# Patient Record
Sex: Female | Born: 1990
Health system: Southern US, Community
[De-identification: ages and names within clinical notes are randomized; demographics above are authoritative.]

## PROBLEM LIST (undated history)

## (undated) DIAGNOSIS — O4443 Low lying placenta NOS or without hemorrhage, third trimester: Secondary | ICD-10-CM

## (undated) DIAGNOSIS — D649 Anemia, unspecified: Secondary | ICD-10-CM

## (undated) DIAGNOSIS — N912 Amenorrhea, unspecified: Secondary | ICD-10-CM

## (undated) DIAGNOSIS — R109 Unspecified abdominal pain: Secondary | ICD-10-CM

## (undated) DIAGNOSIS — K649 Unspecified hemorrhoids: Secondary | ICD-10-CM

## (undated) DIAGNOSIS — Z8041 Family history of malignant neoplasm of ovary: Secondary | ICD-10-CM

## (undated) DIAGNOSIS — A749 Chlamydial infection, unspecified: Secondary | ICD-10-CM

## (undated) DIAGNOSIS — O021 Missed abortion: Secondary | ICD-10-CM

## (undated) HISTORY — DX: Unspecified abdominal pain: R10.9

## (undated) HISTORY — DX: Amenorrhea, unspecified: N91.2

## (undated) HISTORY — DX: Chlamydial infection, unspecified: A74.9

## (undated) HISTORY — DX: Unspecified hemorrhoids: K64.9

## (undated) HISTORY — DX: Family history of malignant neoplasm of ovary: Z80.41

## (undated) HISTORY — DX: Missed abortion: O02.1

## (undated) HISTORY — PX: EYE SURGERY: SHX253

---
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
## 2010-05-21 DIAGNOSIS — A749 Chlamydial infection, unspecified: Secondary | ICD-10-CM

## 2010-05-21 HISTORY — DX: Chlamydial infection, unspecified: A74.9

## 2010-08-12 ENCOUNTER — Emergency Department: Payer: Self-pay | Admitting: Emergency Medicine

## 2011-02-22 ENCOUNTER — Emergency Department: Payer: Self-pay | Admitting: *Deleted

## 2012-04-30 ENCOUNTER — Emergency Department: Payer: Self-pay | Admitting: Emergency Medicine

## 2012-04-30 LAB — CBC
HGB: 13.3 g/dL (ref 12.0–16.0)
MCH: 25 pg — ABNORMAL LOW (ref 26.0–34.0)
MCV: 78 fL — ABNORMAL LOW (ref 80–100)
Platelet: 331 10*3/uL (ref 150–440)
RBC: 5.3 10*6/uL — ABNORMAL HIGH (ref 3.80–5.20)
WBC: 14.2 10*3/uL — ABNORMAL HIGH (ref 3.6–11.0)

## 2012-04-30 LAB — URINALYSIS, COMPLETE
Bilirubin,UR: NEGATIVE
Leukocyte Esterase: NEGATIVE
Ph: 5 (ref 4.5–8.0)
RBC,UR: 3 /HPF (ref 0–5)
Squamous Epithelial: 4
WBC UR: 2 /HPF (ref 0–5)

## 2012-04-30 LAB — COMPREHENSIVE METABOLIC PANEL
Albumin: 3.9 g/dL (ref 3.4–5.0)
Alkaline Phosphatase: 87 U/L (ref 50–136)
BUN: 7 mg/dL (ref 7–18)
Calcium, Total: 8.9 mg/dL (ref 8.5–10.1)
Co2: 26 mmol/L (ref 21–32)
Creatinine: 0.63 mg/dL (ref 0.60–1.30)
EGFR (African American): >60
EGFR (Non-African Amer.): >60
Glucose: 88 mg/dL (ref 65–99)
Osmolality: 269 (ref 275–301)
SGOT(AST): 26 U/L (ref 15–37)
SGPT (ALT): 17 U/L (ref 12–78)
Sodium: 136 mmol/L (ref 136–145)

## 2012-04-30 LAB — LIPASE, BLOOD: Lipase: 112 U/L (ref 73–393)

## 2014-05-31 ENCOUNTER — Ambulatory Visit: Payer: Self-pay | Admitting: Obstetrics & Gynecology

## 2014-05-31 LAB — CBC
HCT: 37.5 % (ref 35.0–47.0)
HGB: 12 g/dL (ref 12.0–16.0)
MCH: 24.9 pg — AB (ref 26.0–34.0)
MCHC: 31.9 g/dL — ABNORMAL LOW (ref 32.0–36.0)
MCV: 78 fL — AB (ref 80–100)
Platelet: 297 10*3/uL (ref 150–440)
RBC: 4.81 10*6/uL (ref 3.80–5.20)
RDW: 15.9 % — ABNORMAL HIGH (ref 11.5–14.5)
WBC: 10.4 10*3/uL (ref 3.6–11.0)

## 2014-06-01 ENCOUNTER — Ambulatory Visit: Payer: Self-pay | Admitting: Obstetrics & Gynecology

## 2014-06-01 DIAGNOSIS — O021 Missed abortion: Secondary | ICD-10-CM

## 2014-06-01 HISTORY — PX: DILATION AND CURETTAGE OF UTERUS: SHX78

## 2014-06-01 HISTORY — DX: Missed abortion: O02.1

## 2014-06-03 ENCOUNTER — Emergency Department: Payer: Self-pay | Admitting: Student

## 2014-09-13 LAB — SURGICAL PATHOLOGY

## 2014-09-19 NOTE — Op Note (Signed)
PATIENT NAME:  Deborah House, Amri R MR#:  [id number] DATE OF BIRTH:  [birth year]  DATE OF PROCEDURE:  06/01/2014  PREOPERATIVE DIAGNOSIS:  Missed abortion.   POSTOPERATIVE DIAGNOSIS: Missed abortion.  PROCEDURE:  Suction dilation and curettage.   SURGEON:  Annamarie MajorPaul Hye Trawick, MD   ANESTHESIA: General.   ESTIMATED BLOOD LOSS: Minimal.   COMPLICATIONS: None.   FINDINGS: Positive conception and specimen.   DISPOSITION: To the recovery room in stable condition.   TECHNIQUE: The patient is prepped and draped in the usual sterile fashion after adequate anesthesia is obtained in the dorsal lithotomy position.  The bladder is drained with a Robinson catheter. A speculum is placed and the anterior lip of the cervix is grasped with a tenaculum. The cervix is dilated to a size 20 Pratt dilator. An 8 mm rigid curette is then placed with aspiration of products of conception.  Using a grasping forceps tissue is retrieved from the uterus for specimen purposes.  Continued aspirations are performed until minimal tissue and bleeding is noted. A mild curettage with a banjo curette is then performed, and a final aspiration is performed.  Excellent hemostasis is noted at the tenaculum and cervical site. The patient goes to the recovery room in stable condition. All sponge, instrument, and needle counts are correct.      ____________________________ R. Annamarie MajorPaul Wendel Homeyer, MD rph:DT D: 06/01/2014 09:32:32 ET T: 06/01/2014 10:13:04 ET JOB#: [id number]  cc: Dierdre Searles. Paul Jamilex Bohnsack, MD, <Dictator> Nadara MustardOBERT P Juandavid Dallman MD ELECTRONICALLY SIGNED 06/01/2014 14:07

## 2015-02-23 ENCOUNTER — Emergency Department
Admission: EM | Admit: 2015-02-23 | Discharge: 2015-02-24 | Disposition: A | Discharge status: Home / Self Care | Payer: 59 | Attending: Emergency Medicine | Admitting: Emergency Medicine

## 2015-02-23 ENCOUNTER — Encounter: Payer: Self-pay | Admitting: Emergency Medicine

## 2015-02-23 DIAGNOSIS — T7840XA Allergy, unspecified, initial encounter: Secondary | ICD-10-CM

## 2015-02-23 DIAGNOSIS — X58XXXA Exposure to other specified factors, initial encounter: Secondary | ICD-10-CM | POA: Insufficient documentation

## 2015-02-23 DIAGNOSIS — Y9389 Activity, other specified: Secondary | ICD-10-CM | POA: Insufficient documentation

## 2015-02-23 DIAGNOSIS — Y998 Other external cause status: Secondary | ICD-10-CM | POA: Insufficient documentation

## 2015-02-23 DIAGNOSIS — T783XXA Angioneurotic edema, initial encounter: Secondary | ICD-10-CM | POA: Diagnosis not present

## 2015-02-23 DIAGNOSIS — Y9289 Other specified places as the place of occurrence of the external cause: Secondary | ICD-10-CM | POA: Diagnosis not present

## 2015-02-23 DIAGNOSIS — Z79899 Other long term (current) drug therapy: Secondary | ICD-10-CM | POA: Diagnosis not present

## 2015-02-23 MED ORDER — METHYLPREDNISOLONE SODIUM SUCC 125 MG IJ SOLR
125.0000 mg | Freq: Once | INTRAMUSCULAR | Status: AC
  Administered 2015-02-23: 125 mg via INTRAVENOUS
  Filled 2015-02-23: qty 2

## 2015-02-23 MED ORDER — DIPHENHYDRAMINE HCL 50 MG/ML IJ SOLN
25.0000 mg | Freq: Once | INTRAMUSCULAR | Status: AC
  Administered 2015-02-23: 25 mg via INTRAVENOUS
  Filled 2015-02-23: qty 1

## 2015-02-23 MED ORDER — SODIUM CHLORIDE 0.9 % IV BOLUS (SEPSIS)
1000.0000 mL | Freq: Once | INTRAVENOUS | Status: AC
  Administered 2015-02-23: 1000 mL via INTRAVENOUS

## 2015-02-23 MED ORDER — FAMOTIDINE IN NACL 20-0.9 MG/50ML-% IV SOLN
20.0000 mg | Freq: Once | INTRAVENOUS | Status: AC
  Administered 2015-02-23: 20 mg via INTRAVENOUS
  Filled 2015-02-23: qty 50

## 2015-02-23 NOTE — ED Notes (Signed)
Patient presents to ED with c/o allergic reaction 30 min PTA, pt reports she has not changed laundry detergent, soaps, or foods. Patient states she had tooth extraction performed this morning, reports was not placed on antibiotics or new medications. Patient's eyes  And lips are swollen, report throat is itching, but denies difficulty breathing. Pt able to speak in complete sentences, no increased work in breathing noted. Skin warm and dry. No other complaints at this time. Will continue to monitor patient.

## 2015-02-23 NOTE — ED Notes (Signed)
Patient presents to ED with an allergic reaction. Unsure what she was exposed to. Had a tooth pulled this morning but denies new medications or antibiotics. Patient's eyes are swollen, says her mouth is itchy, lips are swollen. Able to speak in complete sentences without respiratory distress.

## 2015-02-23 NOTE — ED Provider Notes (Signed)
[facility] Emergency Department Provider Note  ____________________________________________  Time seen: Approximately 11:26 PM  I have reviewed the triage vital signs and the nursing notes.   HISTORY  Chief Complaint Allergic Reaction    HPI Deborah House is a 24 y.o. female who presents to the ED from home with a chief complaint of acute allergic reaction. Patient reports onset of bilateral eye swelling and itchy sensation approximately 30 minutes PTA while she was braiding her hair. Patient states she had a tooth extraction performed this morning, did not receive nor take antibiotics. She took equate brand ibuprofen at approximately 6 PM, which she has taken previously without adverse reaction. Patient ate tomato soup with milk in it, again items that she has eaten previously without adverse reaction. Patient denies chest pain, shortness of breath, abdominal pain, nausea, vomiting, diarrhea, hives. States initially her mouth was itchy but that has since resolved. Patient did not take medicines prior to arrival. Denies new exposures otherwise.States this has happened twice before, most recently last month. This is her first trip to the emergency department for evaluation of allergic reaction.   Past medical history None   There are no active problems to display for this patient.   Past surgical history Tooth extraction  Current Outpatient Rx  Name  Route  Sig  Dispense  Refill  . norgestimate-ethinyl estradiol (ORTHO-CYCLEN,SPRINTEC,PREVIFEM) 0.25-35 MG-MCG tablet   Oral   Take 1 tablet by mouth daily.           Allergies NKDA  No family history on file.  Social History Social History  Substance Use Topics  . Smoking status: Never Smoker   . Smokeless tobacco: None  . Alcohol Use: No    Review of Systems Constitutional: No fever/chills Eyes: Positive for bilateral eye swelling. No visual changes. ENT: No sore throat. Cardiovascular:  Denies chest pain. Respiratory: Denies shortness of breath. Gastrointestinal: No abdominal pain.  No nausea, no vomiting.  No diarrhea.  No constipation. Genitourinary: Negative for dysuria. Musculoskeletal: Negative for back pain. Skin: Negative for rash. Neurological: Negative for headaches, focal weakness or numbness.  10-point ROS otherwise negative.  ____________________________________________   PHYSICAL EXAM:  VITAL SIGNS: ED Triage Vitals  Enc Vitals Group     BP 02/23/15 2323 132/97 mmHg     Pulse Rate 02/23/15 2323 91     Resp --      Temp 02/23/15 2323 98.3 F (36.8 C)     Temp Source 02/23/15 2323 Oral     SpO2 02/23/15 2323 100 %     Weight 02/23/15 2323 190 lb (86.183 kg)     Height 02/23/15 2323  (1.676 m)     Head Cir --      Peak Flow --      Pain Score 02/23/15 2325 0     Pain Loc --      Pain Edu? --      Excl. in GC? --     Constitutional: Alert and oriented. Well appearing and in no acute distress. Eyes: Bilateral periorbital edema. Conjunctivae are normal. PERRL. EOMI. Head: Atraumatic. Nose: No congestion/rhinnorhea. Mouth/Throat: Mucous membranes are moist.  Oropharynx non-erythematous. There is no tongue swelling. There is no hoarse or muffled voice. There is no drooling. Neck: No stridor. Submental space is supple and soft. Cardiovascular: Normal rate, regular rhythm. Grossly normal heart sounds.  Good peripheral circulation. Respiratory: Normal respiratory effort.  No retractions. Lungs CTAB. Gastrointestinal: Soft and nontender. No distention. No  abdominal bruits. No CVA tenderness. Musculoskeletal: No lower extremity tenderness nor edema.  No joint effusions. Neurologic:  Normal speech and language. No gross focal neurologic deficits are appreciated. No gait instability. Skin:  Skin is warm, dry and intact. No rash noted. Specifically, no hives. Psychiatric: Mood and affect are normal. Speech and behavior are  normal.  ____________________________________________   LABS (all labs ordered are listed, but only abnormal results are displayed)  Labs Reviewed - No data to display ____________________________________________  EKG  None ____________________________________________  RADIOLOGY  None ____________________________________________   PROCEDURES  Procedure(s) performed: None  Critical Care performed: No  ____________________________________________   INITIAL IMPRESSION / ASSESSMENT AND PLAN / ED COURSE  Pertinent labs & imaging results that were available during my care of the patient were reviewed by me and considered in my medical decision making (see chart for details).  24 year old female who presents for acute allergic reaction. Will initiate IV Solu-Medrol, Benadryl, Pepcid and continue observation 3 hours. Will hold off on IM epinephrine given patient is not experiencing breathng difficulty, tongue swelling or shortness of breath. Ice pack given for patient's eyes.  ----------------------------------------- 1:50 AM on 02/24/2015 -----------------------------------------  Swelling decreased. There is no tongue swelling or respiratory distress. Room air saturations 98%. Strict return precautions given. Patient verbalizes understanding and agrees with plan of care. ____________________________________________   FINAL CLINICAL IMPRESSION(S) / ED DIAGNOSES  Final diagnoses:  Allergic reaction, initial encounter  Angioedema, initial encounter      Irean Hong, MD 02/24/15 [phone]

## 2015-02-24 MED ORDER — PREDNISONE 20 MG PO TABS: ORAL_TABLET | ORAL | Status: DC

## 2015-02-24 MED ORDER — FAMOTIDINE 20 MG PO TABS: 20.0000 mg | ORAL_TABLET | Freq: Two times a day (BID) | ORAL | Status: DC

## 2015-02-24 NOTE — ED Notes (Signed)

## 2015-02-24 NOTE — Discharge Instructions (Signed)
1. Take the following medicines for the next 4 days: °Prednisone 60mg daily °Pepcid 20mg twice daily °2. Take Benadryl as needed for itching. °3. Return to the ER for worsening symptoms, persistent vomiting, difficulty breathing or other concerns. ° ° °Allergies °An allergy is an abnormal reaction to a substance by the body's defense system (immune system). Allergies can develop at any age. °WHAT CAUSES ALLERGIES? °An allergic reaction happens when the immune system mistakenly reacts to a normally harmless substance, called an allergen, as if it were harmful. The immune system releases antibodies to fight the substance. Antibodies eventually release a chemical called histamine into the bloodstream. The release of histamine is meant to protect the body from infection, but it also causes discomfort. °An allergic reaction can be triggered by: °· Eating an allergen. °· Inhaling an allergen. °· Touching an allergen. °WHAT TYPES OF ALLERGIES ARE THERE? °There are many types of allergies. Common types include: °· Seasonal allergies. People with this type of allergy are usually allergic to substances that are only present during certain seasons, such as molds and pollens. °· Food allergies. °· Drug allergies. °· Insect allergies. °· Animal dander allergies. °WHAT ARE SYMPTOMS OF ALLERGIES? °Possible allergy symptoms include: °· Swelling of the lips, face, tongue, mouth, or throat. °· Sneezing, coughing, or wheezing. °· Nasal congestion. °· Tingling in the mouth. °· Rash. °· Itching. °· Itchy, red, swollen areas of skin (hives). °· Watery eyes. °· Vomiting. °· Diarrhea. °· Dizziness. °· Lightheadedness. °· Fainting. °· Trouble breathing or swallowing. °· Chest tightness. °· Rapid heartbeat. °HOW ARE ALLERGIES DIAGNOSED? °Allergies are diagnosed with a medical and family history and one or more of the following: °· Skin tests. °· Blood tests. °· A food diary. A food diary is a record of all the foods and drinks you have in a  day and of all the symptoms you experience. °· The results of an elimination diet. An elimination diet involves eliminating foods from your diet and then adding them back in one by one to find out if a certain food causes an allergic reaction. °HOW ARE ALLERGIES TREATED? °There is no cure for allergies, but allergic reactions can be treated with medicine. Severe reactions usually need to be treated at a hospital. °HOW CAN REACTIONS BE PREVENTED? °The best way to prevent an allergic reaction is by avoiding the substance you are allergic to. Allergy shots and medicines can also help prevent reactions in some cases. People with severe allergic reactions may be able to prevent a life-threatening reaction called anaphylaxis with a medicine given right after exposure to the allergen. °  °This information is not intended to replace advice given to you by your health care provider. Make sure you discuss any questions you have with your health care provider. °  °Document Released: 07/31/2002 Document Revised: 05/28/2014 Document Reviewed: 02/16/2014 °Elsevier Interactive Patient Education ©2016 [employer] ° °Angioedema °Angioedema is a sudden swelling of tissues, often of the skin. It can occur on the face or genitals or in the abdomen or other body parts. The swelling usually develops over a short period and gets better in 24 to 48 hours. It often begins during the night and is found when the person wakes up. The person may also get red, itchy patches of skin (hives). Angioedema can be dangerous if it involves swelling of the air passages.  °Depending on the cause, episodes of angioedema may only happen once, come back in unpredictable patterns, or repeat for several years and then   gradually fade away.  °CAUSES  °Angioedema can be caused by an allergic reaction to various triggers. It can also result from nonallergic causes, including reactions to drugs, immune system disorders, viral infections, or an abnormal gene that  is passed to you from your parents (hereditary). For some people with angioedema, the cause is unknown.  °Some things that can trigger angioedema include:  °· Foods.   °· Medicines, such as ACE inhibitors, ARBs, nonsteroidal anti-inflammatory agents, or estrogen.   °· Latex.   °· Animal saliva.   °· Insect stings.   °· Dyes used in X-rays.   °· Mild injury.   °· Dental work. °· Surgery. °· Stress.   °· Sudden changes in temperature.   °· Exercise. °SIGNS AND SYMPTOMS  °· Swelling of the skin. °· Hives. If these are present, there is also intense itching. °· Redness in the affected area.   °· Pain in the affected area. °· Swollen lips or tongue. °· Breathing problems. This may happen if the air passages swell. °· Wheezing. °If internal organs are involved, there may be:  °· Nausea.   °· Abdominal pain.   °· Vomiting.   °· Difficulty swallowing.   °· Difficulty passing urine. °DIAGNOSIS  °· Your health care provider will examine the affected area and take a medical and family history. °· Various tests may be done to help determine the cause. Tests may include: °¨ Allergy skin tests to see if the problem is an allergic reaction.   °¨ Blood tests to check for hereditary angioedema.   °¨ Tests to check for underlying diseases that could cause the condition.   °· A review of your medicines, including over-the-counter medicines, may be done. °TREATMENT  °Treatment will depend on the cause of the angioedema. Possible treatments include:  °· Removal of anything that triggered the condition (such as stopping certain medicines).   °· Medicines to treat symptoms or prevent attacks. Medicines given may include:   °¨ Antihistamines.   °¨ Epinephrine injection.   °¨ Steroids.   °· Hospitalization may be required for severe attacks. If the air passages are affected, it can be an emergency. Tubes may need to be placed to keep the airway open. °HOME CARE INSTRUCTIONS  °· Take all medicines as directed by your health care  provider. °· If you were given medicines for emergency allergy treatment, always carry them with you. °· Wear a medical bracelet as directed by your health care provider.   °· Avoid known triggers. °SEEK MEDICAL CARE IF:  °· You have repeat attacks of angioedema.   °· Your attacks are more frequent or more severe despite preventive measures.   °· You have hereditary angioedema and are considering having children. It is important to discuss with your health care provider the risks of passing the condition on to your children. °SEEK IMMEDIATE MEDICAL CARE IF:  °· You have severe swelling of the mouth, tongue, or lips. °· You have difficulty breathing.   °· You have difficulty swallowing.   °· You faint. °MAKE SURE YOU: °· Understand these instructions. °· Will watch your condition. °· Will get help right away if you are not doing well or get worse. °  °This information is not intended to replace advice given to you by your health care provider. Make sure you discuss any questions you have with your health care provider. °  °Document Released: 07/16/2001 Document Revised: 05/28/2014 Document Reviewed: 12/29/2012 °Elsevier Interactive Patient Education ©2016 Elsevier Inc. ° °

## 2015-02-24 NOTE — ED Notes (Addendum)
Pt reports itching has resolved, swelling has decreased some around the eyes and lips. Patient denies difficulty swallowing or breathing.  NAD noted. Patient resting comfortably in bed.

## 2015-05-22 HISTORY — PX: MOUTH SURGERY: SHX715

## 2015-07-20 DIAGNOSIS — Z8041 Family history of malignant neoplasm of ovary: Secondary | ICD-10-CM

## 2015-07-20 HISTORY — DX: Family history of malignant neoplasm of ovary: Z80.41

## 2015-07-20 LAB — HM PAP SMEAR: HM PAP: NEGATIVE

## 2016-09-12 ENCOUNTER — Ambulatory Visit (INDEPENDENT_AMBULATORY_CARE_PROVIDER_SITE_OTHER): Payer: 59 | Admitting: Obstetrics and Gynecology

## 2016-09-12 ENCOUNTER — Encounter: Payer: Self-pay | Admitting: Obstetrics and Gynecology

## 2016-09-12 VITALS — BP 120/90 | HR 60 | Ht 66.0 in | Wt 204.0 lb

## 2016-09-12 DIAGNOSIS — B373 Candidiasis of vulva and vagina: Secondary | ICD-10-CM | POA: Diagnosis not present

## 2016-09-12 DIAGNOSIS — N898 Other specified noninflammatory disorders of vagina: Secondary | ICD-10-CM | POA: Diagnosis not present

## 2016-09-12 DIAGNOSIS — B3731 Acute candidiasis of vulva and vagina: Secondary | ICD-10-CM

## 2016-09-12 LAB — POCT WET PREP WITH KOH
Clue Cells Wet Prep HPF POC: NEGATIVE
KOH PREP POC: NEGATIVE
Trichomonas, UA: NEGATIVE
Yeast Wet Prep HPF POC: POSITIVE

## 2016-09-12 MED ORDER — TERCONAZOLE 0.4 % VA CREA: 1.0000 | TOPICAL_CREAM | Freq: Once | VAGINAL | 2 refills | Status: AC

## 2016-09-12 NOTE — Progress Notes (Signed)
Chief Complaint  Patient presents with  . Gynecologic Exam    YEAST INF, ITCHING    HPI:      Ms. Deborah House is a 26 y.o. G1P0010 who LMP was Patient's last menstrual period was 08/14/2016., presents today for vaginal itching, irritaiton, d/c, without odor for the past 5 days. She has a hx of recurrent yeast and AV on One Swab 2/18. Pt's sx improve with yeast meds. Pt is using dove sens skin soap, no dryer sheets. She is using pantyliners now. No hx of DM/pre-DM.    Review of Systems  Constitutional: Negative for fever.  Gastrointestinal: Negative for blood in stool, constipation, diarrhea, nausea and vomiting.  Genitourinary: Positive for vaginal discharge. Negative for dyspareunia, dysuria, flank pain, frequency, hematuria, urgency, vaginal bleeding and vaginal pain.  Musculoskeletal: Negative for back pain.  Skin: Positive for rash.    There are no active problems to display for this patient.   Family History  Problem Relation Age of Onset  . Diabetes Maternal Grandmother   . Cancer Maternal Grandmother 10    OVARIAN    Social History   Social History  . Marital status: Married    Spouse name: N/A  . Number of children: 0  . Years of education: 12   Occupational History  . [employer]    Social History Main Topics  . Smoking status: Never Smoker  . Smokeless tobacco: Never Used  . Alcohol use No  . Drug use: No  . Sexual activity: Yes    Birth control/ protection: None   Other Topics Concern  . Not on file   Social History Narrative  . No narrative on file     Current Outpatient Prescriptions:  .  terconazole (TERAZOL 7) 0.4 % vaginal cream, Place 1 applicator vaginally once. Nightly for 7 nights, then once weekly for 3 months as maintenance, Disp: 45 g, Rfl: 2  OBJECTIVE:   Vitals:  BP 120/90 (Patient Position: Sitting)   Pulse 60   Ht  (1.676 m)   Wt 204 lb (92.5 kg)   LMP 08/14/2016 Comment: 5D EARLY  BMI 32.93 kg/m    Physical Exam  Constitutional: She is oriented to person, place, and time and well-developed, well-nourished, and in no distress. Vital signs are normal.  Genitourinary: Uterus normal, cervix normal, right adnexa normal and left adnexa normal. Uterus is not enlarged. Cervix exhibits no motion tenderness and no tenderness. Right adnexum displays no mass and no tenderness. Left adnexum displays no mass and no tenderness. Vulva exhibits erythema and exudate. Vulva exhibits no lesion, no rash and no tenderness. Vagina exhibits no lesion. Curdy  brown and vaginal discharge found.  Neurological: She is oriented to person, place, and time.  Vitals reviewed.   Results: Results for orders placed or performed in visit on 09/12/16 (from the past 24 hour(s))  POCT Wet Prep with KOH     Status: Abnormal   Collection Time: 09/12/16  9:27 AM  Result Value Ref Range   Trichomonas, UA Negative    Clue Cells Wet Prep HPF POC neg    Epithelial Wet Prep HPF POC  Few, Moderate, Many, Too numerous to count   Yeast Wet Prep HPF POC pos    Bacteria Wet Prep HPF POC  Few   RBC Wet Prep HPF POC     WBC Wet Prep HPF POC     KOH Prep POC Negative Negative     Assessment/Plan: Candidal vaginitis -  Rx terazol for 7 nights, then once weekly for 3 months as maintenance. F/u prn.  - Plan: POCT Wet Prep with KOH  Vaginal discharge - Hx of AV (GBS), so if sx persist with yeast tx, will retreat for GBS. - Plan: POCT Wet Prep with KOH     Meds ordered this encounter  Medications  . DISCONTD: terconazole (TERAZOL 7) 0.4 % vaginal cream    Sig: Place 1 applicator vaginally once.    Refill:  0  . terconazole (TERAZOL 7) 0.4 % vaginal cream    Sig: Place 1 applicator vaginally once. Nightly for 7 nights, then once weekly for 3 months as maintenance    Dispense:  45 g    Refill:  2      Return if symptoms worsen or fail to improve, for annual.  Alicia B. Copland, PA-C 09/12/2016 9:26 AM

## 2016-11-02 ENCOUNTER — Telehealth: Payer: Self-pay

## 2016-11-02 NOTE — Telephone Encounter (Signed)
Pt called to see if okay to use terazol 7 when preg (early).  Adv per [facility] it is okay to continue using.

## 2016-11-07 ENCOUNTER — Encounter: Payer: Self-pay | Admitting: Obstetrics & Gynecology

## 2016-11-07 ENCOUNTER — Ambulatory Visit (INDEPENDENT_AMBULATORY_CARE_PROVIDER_SITE_OTHER): Payer: 59 | Admitting: Obstetrics & Gynecology

## 2016-11-07 VITALS — BP 100/60 | Wt 208.0 lb

## 2016-11-07 DIAGNOSIS — O99211 Obesity complicating pregnancy, first trimester: Secondary | ICD-10-CM

## 2016-11-07 DIAGNOSIS — O99210 Obesity complicating pregnancy, unspecified trimester: Secondary | ICD-10-CM | POA: Insufficient documentation

## 2016-11-07 DIAGNOSIS — Z3490 Encounter for supervision of normal pregnancy, unspecified, unspecified trimester: Secondary | ICD-10-CM | POA: Diagnosis not present

## 2016-11-07 DIAGNOSIS — O0993 Supervision of high risk pregnancy, unspecified, third trimester: Secondary | ICD-10-CM | POA: Insufficient documentation

## 2016-11-07 DIAGNOSIS — Z3A01 Less than 8 weeks gestation of pregnancy: Secondary | ICD-10-CM

## 2016-11-07 LAB — POCT URINE PREGNANCY: PREG TEST UR: POSITIVE — AB

## 2016-11-07 NOTE — Progress Notes (Signed)
11/07/2016   Chief Complaint: Missed period  Transfer of Care Patient: no  History of Present Illness: Deborah House is a 26 y.o. G2P0010 [redacted]w[redacted]d based on Patient's last menstrual period was 09/20/2016. with an Estimated Date of Delivery: 06/27/17, with the above CC.   Her periods were: irregular periods from 28 to 36 days She was using no method when she conceived.  She has Positive signs or symptoms of nausea/vomiting of pregnancy. She has Negative signs or symptoms of miscarriage or preterm labor She identifies Negative Zika risk factors for her and her partner On any different medications around the time she conceived/early pregnancy: No  History of varicella: Yes   ROS: A 12-point review of systems was performed and negative, except as stated in the above HPI.  OBGYN History: As per HPI. OB History  Gravida Para Term Preterm AB Living  2       1    SAB TAB Ectopic Multiple Live Births  1            # Outcome Date GA Lbr Len/2nd Weight Sex Delivery Anes PTL Lv  2 Current           1 SAB 06/01/14             Birth Comments: D&C      Any issues with any prior pregnancies: no Any prior children are healthy, doing well, without any problems or issues: not applicable History of pap smears: No. Last pap smear 2016. Abnormal: no  History of STIs: No   Past Medical History: Past Medical History:  Diagnosis Date  . Abdominal pain   . Amenorrhea   . Chlamydia 2012   tx'd  . Family history of ovarian cancer 07/2015   genetic testing letter sent  . Hemorrhoids   . Missed abortion 06/01/2014   [facility]    Past Surgical History: Past Surgical History:  Procedure Laterality Date  . DILATION AND CURETTAGE OF UTERUS      Family History:  Family History  Problem Relation Age of Onset  . Diabetes Maternal Grandmother   . Cancer Maternal Grandmother 57       OVARIAN   She denies any female cancers, bleeding or blood clotting disorders.  She denies any history of mental  retardation, birth defects or genetic disorders in her or the FOB's history  Social History:  Social History   Social History  . Marital status: Married    Spouse name: N/A  . Number of children: 0  . Years of education: 12   Occupational History  . [employer]    Social History Main Topics  . Smoking status: Never Smoker  . Smokeless tobacco: Never Used  . Alcohol use No  . Drug use: No  . Sexual activity: Yes    Birth control/ protection: None   Other Topics Concern  . Not on file   Social History Narrative  . No narrative on file   Any pets in the household: no  Allergy: Allergies  Allergen Reactions  . Ibuprofen     Swelling     Current Outpatient Medications:  Current Outpatient Prescriptions:  .  terconazole (TERAZOL 7) 0.4 % vaginal cream, PLACE 1 APPLICATOR VAGINALLY NIGHTLY FOR 7 NIGHTS, THEN ONCE WEEKLY FOR 3 MONTHS AS MAINTENANCE, Disp: , Rfl: 2   Physical Exam:   BP 100/60   Wt 208 lb (94.3 kg)   LMP 09/20/2016 Comment: 5D EARLY  BMI 33.57 kg/m  Body mass index  is 33.57 kg/m. Constitutional: Well nourished, well developed female in no acute distress.  Neck:  Supple, normal appearance, and no thyromegaly  Cardiovascular: S1, S2 normal, no murmur, rub or gallop, regular rate and rhythm Respiratory:  Clear to auscultation bilateral. Normal respiratory effort Abdomen: positive bowel sounds and no masses, hernias; diffusely non tender to palpation, non distended Breasts: breasts appear normal, no suspicious masses, no skin or nipple changes or axillary nodes. Neuro/Psych:  Normal mood and affect.  Skin:  Warm and dry.  Lymphatic:  No inguinal lymphadenopathy.   Pelvic exam: is not limited by body habitus EGBUS: within normal limits, Vagina: within normal limits and with no blood in the vault, Cervix: normal appearing cervix without discharge or lesions, closed/long/high, Uterus:  enlarged: 6 weeks, and Adnexa:  no mass, fullness,  tenderness  Assessment: Deborah House is a 26 y.o. G2P0010 1169w6d based on Patient's last menstrual period was 09/20/2016. with an Estimated Date of Delivery: 06/27/17,  for prenatal care.  Plan:  1) Avoid alcoholic beverages. 2) Patient encouraged not to smoke.  3) Discontinue the use of all non-medicinal drugs and chemicals.  4) Take prenatal vitamins daily.  5) Seatbelt use advised 6) Nutrition, food safety (fish, cheese advisories, and high nitrite foods) and exercise discussed. 7) Hospital and [facility] discussed  8) Patient is asked about travel to areas at risk for the Zika virus, and counseled to avoid travel and exposure to mosquitoes or sexual partners who may have themselves been exposed to the virus. Testing is discussed, and will be ordered as appropriate.  9) [facility]birth classes at [facility] advised 10) Genetic Screening, such as with 1st Trimester Screening, cell free fetal DNA, AFP testing, and Ultrasound, as well as with amniocentesis and CVS as appropriate, is discussed with patient. She plans to have not genetic testing this pregnancy. 11) Early Glucola due to obesity  Problem list reviewed and updated.  Annamarie MajorPaul Kayvon Mo, MD, Merlinda FrederickFACOG [facility], [facility] 11/07/2016  2:10 PM

## 2016-11-07 NOTE — Addendum Note (Signed)
Addended by: Cornelius MorasPATTERSON, Timohty Renbarger D on: 11/07/2016 02:33 PM   Modules accepted: Orders

## 2016-11-07 NOTE — Patient Instructions (Signed)
First Trimester of Pregnancy The first trimester of pregnancy is from week 1 until the end of week 13 (months 1 through 3). A week after a sperm fertilizes an egg, the egg will implant on the wall of the uterus. This embryo will begin to develop into a baby. Genes from you and your partner will form the baby. The female genes will determine whether the baby will be a boy or a girl. At 6-8 weeks, the eyes and face will be formed, and the heartbeat can be seen on ultrasound. At the end of 12 weeks, all the baby's organs will be formed. Now that you are pregnant, you will want to do everything you can to have a healthy baby. Two of the most important things are to get good prenatal care and to follow your health care provider's instructions. Prenatal care is all the medical care you receive before the baby's birth. This care will help prevent, find, and treat any problems during the pregnancy and childbirth. Body changes during your first trimester Your body goes through many changes during pregnancy. The changes vary from woman to woman.  You may gain or lose a couple of pounds at first.  You may feel sick to your stomach (nauseous) and you may throw up (vomit). If the vomiting is uncontrollable, call your health care provider.  You may tire easily.  You may develop headaches that can be relieved by medicines. All medicines should be approved by your health care provider.  You may urinate more often. Painful urination may mean you have a bladder infection.  You may develop heartburn as a result of your pregnancy.  You may develop constipation because certain hormones are causing the muscles that push stool through your intestines to slow down.  You may develop hemorrhoids or swollen veins (varicose veins).  Your breasts may begin to grow larger and become tender. Your nipples may stick out more, and the tissue that surrounds them (areola) may become darker.  Your gums may bleed and may be  sensitive to brushing and flossing.  Dark spots or blotches (chloasma, mask of pregnancy) may develop on your face. This will likely fade after the baby is born.  Your menstrual periods will stop.  You may have a loss of appetite.  You may develop cravings for certain kinds of food.  You may have changes in your emotions from day to day, such as being excited to be pregnant or being concerned that something may go wrong with the pregnancy and baby.  You may have more vivid and strange dreams.  You may have changes in your hair. These can include thickening of your hair, rapid growth, and changes in texture. Some women also have hair loss during or after pregnancy, or hair that feels dry or thin. Your hair will most likely return to normal after your baby is born.  What to expect at prenatal visits During a routine prenatal visit:  You will be weighed to make sure you and the baby are growing normally.  Your blood pressure will be taken.  Your abdomen will be measured to track your baby's growth.  The fetal heartbeat will be listened to between weeks 10 and 14 of your pregnancy.  Test results from any previous visits will be discussed.  Your health care provider may ask you:  How you are feeling.  If you are feeling the baby move.  If you have had any abnormal symptoms, such as leaking fluid, bleeding, severe headaches,   or abdominal cramping.  If you are using any tobacco products, including cigarettes, chewing tobacco, and electronic cigarettes.  If you have any questions.  Other tests that may be performed during your first trimester include:  Blood tests to find your blood type and to check for the presence of any previous infections. The tests will also be used to check for low iron levels (anemia) and protein on red blood cells (Rh antibodies). Depending on your risk factors, or if you previously had diabetes during pregnancy, you may have tests to check for high blood  sugar that affects pregnant women (gestational diabetes).  Urine tests to check for infections, diabetes, or protein in the urine.  An ultrasound to confirm the proper growth and development of the baby.  Fetal screens for spinal cord problems (spina bifida) and Down syndrome.  HIV (human immunodeficiency virus) testing. Routine prenatal testing includes screening for HIV, unless you choose not to have this test.  You may need other tests to make sure you and the baby are doing well.  Follow these instructions at home: Medicines  Follow your health care provider's instructions regarding medicine use. Specific medicines may be either safe or unsafe to take during pregnancy.  Take a prenatal vitamin that contains at least 600 micrograms (mcg) of folic acid.  If you develop constipation, try taking a stool softener if your health care provider approves. Eating and drinking  Eat a balanced diet that includes fresh fruits and vegetables, whole grains, good sources of protein such as meat, eggs, or tofu, and low-fat dairy. Your health care provider will help you determine the amount of weight gain that is right for you.  Avoid raw meat and uncooked cheese. These carry germs that can cause birth defects in the baby.  Eating four or five small meals rather than three large meals a day may help relieve nausea and vomiting. If you start to feel nauseous, eating a few soda crackers can be helpful. Drinking liquids between meals, instead of during meals, also seems to help ease nausea and vomiting.  Limit foods that are high in fat and processed sugars, such as fried and sweet foods.  To prevent constipation: ? Eat foods that are high in fiber, such as fresh fruits and vegetables, whole grains, and beans. ? Drink enough fluid to keep your urine clear or pale yellow. Activity  Exercise only as directed by your health care provider. Most women can continue their usual exercise routine during  pregnancy. Try to exercise for 30 minutes at least 5 days a week. Exercising will help you: ? Control your weight. ? Stay in shape. ? Be prepared for labor and delivery.  Experiencing pain or cramping in the lower abdomen or lower back is a good sign that you should stop exercising. Check with your health care provider before continuing with normal exercises.  Try to avoid standing for long periods of time. Move your legs often if you must stand in one place for a long time.  Avoid heavy lifting.  Wear low-heeled shoes and practice good posture.  You may continue to have sex unless your health care provider tells you not to. Relieving pain and discomfort  Wear a good support bra to relieve breast tenderness.  Take warm sitz baths to soothe any pain or discomfort caused by hemorrhoids. Use hemorrhoid cream if your health care provider approves.  Rest with your legs elevated if you have leg cramps or low back pain.  If you develop   varicose veins in your legs, wear support hose. Elevate your feet for 15 minutes, 3-4 times a day. Limit salt in your diet. Prenatal care  Schedule your prenatal visits by the twelfth week of pregnancy. They are usually scheduled monthly at first, then more often in the last 2 months before delivery.  Write down your questions. Take them to your prenatal visits.  Keep all your prenatal visits as told by your health care provider. This is important. Safety  Wear your seat belt at all times when driving.  Make a list of emergency phone numbers, including numbers for family, friends, the hospital, and police and fire departments. General instructions  Ask your health care provider for a referral to a local prenatal education class. Begin classes no later than the beginning of month 6 of your pregnancy.  Ask for help if you have counseling or nutritional needs during pregnancy. Your health care provider can offer advice or refer you to specialists for help  with various needs.  Do not use hot tubs, steam rooms, or saunas.  Do not douche or use tampons or scented sanitary pads.  Do not cross your legs for long periods of time.  Avoid cat litter boxes and soil used by cats. These carry germs that can cause birth defects in the baby and possibly loss of the fetus by miscarriage or stillbirth.  Avoid all smoking, herbs, alcohol, and medicines not prescribed by your health care provider. Chemicals in these products affect the formation and growth of the baby.  Do not use any products that contain nicotine or tobacco, such as cigarettes and e-cigarettes. If you need help quitting, ask your health care provider. You may receive counseling support and other resources to help you quit.  Schedule a dentist appointment. At home, brush your teeth with a soft toothbrush and be gentle when you floss. Contact a health care provider if:  You have dizziness.  You have mild pelvic cramps, pelvic pressure, or nagging pain in the abdominal area.  You have persistent nausea, vomiting, or diarrhea.  You have a bad smelling vaginal discharge.  You have pain when you urinate.  You notice increased swelling in your face, hands, legs, or ankles.  You are exposed to fifth disease or chickenpox.  You are exposed to German measles (rubella) and have never had it. Get help right away if:  You have a fever.  You are leaking fluid from your vagina.  You have spotting or bleeding from your vagina.  You have severe abdominal cramping or pain.  You have rapid weight gain or loss.  You vomit blood or material that looks like coffee grounds.  You develop a severe headache.  You have shortness of breath.  You have any kind of trauma, such as from a fall or a car accident. Summary  The first trimester of pregnancy is from week 1 until the end of week 13 (months 1 through 3).  Your body goes through many changes during pregnancy. The changes vary from  woman to woman.  You will have routine prenatal visits. During those visits, your health care provider will examine you, discuss any test results you may have, and talk with you about how you are feeling. This information is not intended to replace advice given to you by your health care provider. Make sure you discuss any questions you have with your health care provider. Document Released: 05/01/2001 Document Revised: 04/18/2016 Document Reviewed: 04/18/2016 Elsevier Interactive Patient Education  2017 Elsevier   Inc.  

## 2016-11-08 ENCOUNTER — Encounter: Payer: Self-pay | Admitting: Obstetrics and Gynecology

## 2016-11-09 LAB — HEMOGLOBINOPATHY EVALUATION
HEMOGLOBIN F QUANTITATION: 0 % (ref 0.0–2.0)
HGB C: 0 %
HGB S: 0 %
HGB VARIANT: 0 %
Hemoglobin A2 Quantitation: 2.2 % (ref 1.8–3.2)
Hgb A: 97.8 % (ref 96.4–98.8)

## 2016-11-09 LAB — RPR+RH+ABO+RUB AB+AB SCR+CB...
Antibody Screen: NEGATIVE
HEMOGLOBIN: 10.7 g/dL — AB (ref 11.1–15.9)
HIV Screen 4th Generation wRfx: NONREACTIVE
Hematocrit: 34.9 % (ref 34.0–46.6)
Hepatitis B Surface Ag: NEGATIVE
MCH: 22.6 pg — AB (ref 26.6–33.0)
MCHC: 30.7 g/dL — AB (ref 31.5–35.7)
MCV: 74 fL — ABNORMAL LOW (ref 79–97)
Platelets: 336 10*3/uL (ref 150–379)
RBC: 4.73 x10E6/uL (ref 3.77–5.28)
RDW: 16.2 % — ABNORMAL HIGH (ref 12.3–15.4)
RPR Ser Ql: NONREACTIVE
Rh Factor: NEGATIVE
Rubella Antibodies, IGG: 1.68 index (ref >0.99)
VARICELLA: 628 {index} (ref >165)
WBC: 10.3 10*3/uL (ref 3.4–10.8)

## 2016-11-09 LAB — URINE CULTURE: Organism ID, Bacteria: NO GROWTH

## 2016-11-12 LAB — IGP,CTNGTV,RFX APTIMA HPV ASCU
Chlamydia, Nuc. Acid Amp: NEGATIVE
Gonococcus, Nuc. Acid Amp: NEGATIVE
PAP SMEAR COMMENT: 0
Trich vag by NAA: NEGATIVE

## 2016-11-15 ENCOUNTER — Other Ambulatory Visit: Payer: Self-pay

## 2016-11-15 ENCOUNTER — Encounter: Payer: Self-pay | Admitting: Emergency Medicine

## 2016-11-15 DIAGNOSIS — O210 Mild hyperemesis gravidarum: Secondary | ICD-10-CM | POA: Diagnosis not present

## 2016-11-15 DIAGNOSIS — O26812 Pregnancy related exhaustion and fatigue, second trimester: Secondary | ICD-10-CM | POA: Diagnosis present

## 2016-11-15 DIAGNOSIS — Z3A01 Less than 8 weeks gestation of pregnancy: Secondary | ICD-10-CM | POA: Diagnosis not present

## 2016-11-15 LAB — URINALYSIS, COMPLETE (UACMP) WITH MICROSCOPIC
BACTERIA UA: NONE SEEN
Bilirubin Urine: NEGATIVE
Glucose, UA: NEGATIVE mg/dL
HGB URINE DIPSTICK: NEGATIVE
Ketones, ur: NEGATIVE mg/dL
Leukocytes, UA: NEGATIVE
NITRITE: NEGATIVE
PH: 6 (ref 5.0–8.0)
Protein, ur: NEGATIVE mg/dL
SPECIFIC GRAVITY, URINE: 1.018 (ref 1.005–1.030)

## 2016-11-15 LAB — BASIC METABOLIC PANEL
Anion gap: 5 (ref 5–15)
BUN: 6 mg/dL (ref 6–20)
CO2: 24 mmol/L (ref 22–32)
CREATININE: 0.7 mg/dL (ref 0.44–1.00)
Calcium: 8.5 mg/dL — ABNORMAL LOW (ref 8.9–10.3)
Chloride: 104 mmol/L (ref 101–111)
GFR calc Af Amer: >60 mL/min (ref >60)
GLUCOSE: 98 mg/dL (ref 65–99)
Potassium: 3.7 mmol/L (ref 3.5–5.1)
Sodium: 133 mmol/L — ABNORMAL LOW (ref 135–145)

## 2016-11-15 LAB — CBC
HCT: 32.6 % — ABNORMAL LOW (ref 35.0–47.0)
Hemoglobin: 10.8 g/dL — ABNORMAL LOW (ref 12.0–16.0)
MCH: 23.9 pg — ABNORMAL LOW (ref 26.0–34.0)
MCHC: 33.1 g/dL (ref 32.0–36.0)
MCV: 72 fL — ABNORMAL LOW (ref 80.0–100.0)
PLATELETS: 308 10*3/uL (ref 150–440)
RBC: 4.53 MIL/uL (ref 3.80–5.20)
RDW: 15.8 % — AB (ref 11.5–14.5)
WBC: 10.2 10*3/uL (ref 3.6–11.0)

## 2016-11-15 LAB — TROPONIN I

## 2016-11-15 NOTE — ED Triage Notes (Signed)
Pt ambulatory to triage  In NAD, report [redacted] weeks pregnant, reports decreased appetite and overall not feeling well over past week, reports today became dizzy, diaphoretic, and developed a headache.  Pt states diaphoresis stopped, but ongoing dizziness and headache.  Pt reports G2P0A1, w/ hx of miscarriage.

## 2016-11-16 ENCOUNTER — Emergency Department
Admission: EM | Admit: 2016-11-16 | Discharge: 2016-11-16 | Disposition: A | Discharge status: Home / Self Care | Payer: 59 | Attending: Emergency Medicine | Admitting: Emergency Medicine

## 2016-11-16 ENCOUNTER — Emergency Department: Payer: 59

## 2016-11-16 ENCOUNTER — Telehealth: Payer: Self-pay

## 2016-11-16 DIAGNOSIS — O210 Mild hyperemesis gravidarum: Secondary | ICD-10-CM

## 2016-11-16 LAB — HCG, QUANTITATIVE, PREGNANCY: HCG, BETA CHAIN, QUANT, S: 99676 m[IU]/mL — AB (ref <5)

## 2016-11-16 LAB — GLUCOSE, CAPILLARY: Glucose-Capillary: 98 mg/dL (ref 65–99)

## 2016-11-16 MED ORDER — METOCLOPRAMIDE HCL 10 MG PO TABS: 10.0000 mg | ORAL_TABLET | Freq: Three times a day (TID) | ORAL | 0 refills | Status: DC | PRN

## 2016-11-16 NOTE — Telephone Encounter (Signed)
Pt called after hours nurse c/o dizziness, decreased appetite, sweating, and headache.  Pt was seen in ED and has f/u appt c us 11/27/16.

## 2016-11-16 NOTE — ED Notes (Signed)
Per Dr Manson PasseyBrown, pt given meal tray and apple juice to assess pt's ability to tolerate PO intake without N/V.

## 2016-11-16 NOTE — ED Provider Notes (Addendum)
[facility] Emergency Department Provider Note    First MD Initiated Contact with Patient 11/16/16 [phone]     (approximate)  I have reviewed the triage vital signs and the nursing notes.   HISTORY  Chief Complaint Dizziness   HPI Deborah House is a 26 y.o. female G2 P0 (1 previous miscarriage} presents to the emergency department with persistent nausea with inability to eat. Patient states states that she intermittently feels dizzy gets very sweaty and has a headache as a result of not being able to eat. Patient believes that her "sugar gets really low". Patient denies any nausea at present. Patient does admit to a generalized headache with a present pain score 4 out of 10.   Past Medical History:  Diagnosis Date  . Abdominal pain   . Amenorrhea   . Chlamydia 2012   tx'd  . Family history of ovarian cancer 07/2015   genetic testing letter sent  . Hemorrhoids   . Missed abortion 06/01/2014   [facility]    Patient Active Problem List   Diagnosis Date Noted  . Encounter for supervision of low-risk pregnancy, antepartum 11/07/2016  . Obesity affecting pregnancy in first trimester 11/07/2016    Past Surgical History:  Procedure Laterality Date  . DILATION AND CURETTAGE OF UTERUS      Prior to Admission medications   Medication Sig Start Date End Date Taking? Authorizing Provider  terconazole (TERAZOL 7) 0.4 % vaginal cream PLACE 1 APPLICATOR VAGINALLY NIGHTLY FOR 7 NIGHTS, THEN ONCE WEEKLY FOR 3 MONTHS AS MAINTENANCE 10/02/16   [provider]    Allergies Ibuprofen  Family History  Problem Relation Age of Onset  . Diabetes Maternal Grandmother   . Ovarian cancer Maternal Grandmother [id number]    Social History Social History  Substance Use Topics  . Smoking status: Never Smoker  . Smokeless tobacco: Never Used  . Alcohol use No    Review of Systems Constitutional: No fever/chills Eyes: No visual changes. ENT: No sore  throat. Cardiovascular: Denies chest pain. Respiratory: Denies shortness of breath. Gastrointestinal: No abdominal pain.  No nausea, no vomiting.  No diarrhea.  No constipation. Genitourinary: Negative for dysuria. Musculoskeletal: Negative for neck pain.  Negative for back pain. Integumentary: Negative for rash. Neurological: Negative for headaches, focal weakness or numbness.  ____________________________________________   PHYSICAL EXAM:  VITAL SIGNS: ED Triage Vitals  Enc Vitals Group     BP 11/15/16 2317 125/76     Pulse Rate 11/15/16 2317 72     Resp 11/15/16 2317 18     Temp 11/15/16 2317 97.9 F (36.6 C)     Temp Source 11/15/16 2317 Oral     SpO2 11/15/16 2317 99 %     Weight 11/15/16 2317 94.3 kg (208 lb)     Height 11/15/16 2317 1.676 m (5\' 6" )     Head Circumference --      Peak Flow --      Pain Score 11/15/16 2320 7     Pain Loc --      Pain Edu? --      Excl. in GC? --    Constitutional: Alert and oriented. Well appearing and in no acute distress. Eyes: Conjunctivae are normal.  Head: Atraumatic. Mouth/Throat: Mucous membranes are moist. Neck: No stridor.  Cardiovascular: Normal rate, regular rhythm. Good peripheral circulation. Grossly normal heart sounds. Respiratory: Normal respiratory effort.  No retractions. Lungs CTAB. Gastrointestinal: Soft and nontender. No distention.  Musculoskeletal: No lower extremity  tenderness nor edema. No gross deformities of extremities. Neurologic:  Normal speech and language. No gross focal neurologic deficits are appreciated.  Skin:  Skin is warm, dry and intact. No rash noted. Psychiatric: Mood and affect are normal. Speech and behavior are normal.  ____________________________________________   LABS (all labs ordered are listed, but only abnormal results are displayed)  Labs Reviewed  BASIC METABOLIC PANEL - Abnormal; Notable for the following:       Result Value   Sodium 133 (*)    Calcium 8.5 (*)    All  other components within normal limits  CBC - Abnormal; Notable for the following:    Hemoglobin 10.8 (*)    HCT 32.6 (*)    MCV 72.0 (*)    MCH 23.9 (*)    RDW 15.8 (*)    All other components within normal limits  URINALYSIS, COMPLETE (UACMP) WITH MICROSCOPIC - Abnormal; Notable for the following:    Color, Urine YELLOW (*)    APPearance CLEAR (*)    Squamous Epithelial / LPF 0-5 (*)    All other components within normal limits  HCG, QUANTITATIVE, PREGNANCY - Abnormal; Notable for the following:    hCG, Beta Chain, Quant, S [id number] (*)    All other components within normal limits  TROPONIN I  GLUCOSE, CAPILLARY    EKG: ED ECG REPORT I, Sumter N BROWN, the attending physician, personally viewed and interpreted this ECG.   Date: 11/16/2016  EKG Time: 11:23PM  Rate: 78  Rhythm: Normal Sinus Rhythm  Axis: Normal  Intervals:Normal   ST&T Change: None   RADIOLOGY I, Bliss N BROWN, personally viewed and evaluated these images (plain radiographs) as part of my medical decision making, as well as reviewing the written report by the radiologist.  US Ob Comp Less 14 Wks  Result Date: 11/16/2016 CLINICAL DATA:  Pelvic pain for 1 week EXAM: OBSTETRIC <14 WK [place] AND TRANSVAGINAL OB US TECHNIQUE: Both transabdominal and transvaginal ultrasound examinations were performed for complete evaluation of the gestation as well as the maternal uterus, adnexal regions, and pelvic cul-de-sac. Transvaginal technique was performed to assess early pregnancy. COMPARISON:  None. FINDINGS: Intrauterine gestational sac: Single Yolk sac:  Visible Embryo:  Visible Cardiac Activity: Visible Heart Rate: 153  bpm MSD:   mm    w     d CRL:  9.7  mm   7 w   0 d                  [place] EDC: 07/05/2017 Subchorionic hemorrhage:  None visualized. Maternal uterus/adnexae: Normal ovaries. No abnormal pelvic fluid collections. IMPRESSION: Single living intrauterine gestation measuring 7 weeks 0 days by crown-rump length.  Electronically Signed   By: Ellery Plunk M.D.   On: 11/16/2016 04:23   US Ob Transvaginal  Result Date: 11/16/2016 CLINICAL DATA:  Pelvic pain for 1 week EXAM: OBSTETRIC <14 WK [place] AND TRANSVAGINAL OB US TECHNIQUE: Both transabdominal and transvaginal ultrasound examinations were performed for complete evaluation of the gestation as well as the maternal uterus, adnexal regions, and pelvic cul-de-sac. Transvaginal technique was performed to assess early pregnancy. COMPARISON:  None. FINDINGS: Intrauterine gestational sac: Single Yolk sac:  Visible Embryo:  Visible Cardiac Activity: Visible Heart Rate: 153  bpm MSD:   mm    w     d CRL:  9.7  mm   7 w   0 d  [place] EDC: 07/05/2017 Subchorionic hemorrhage:  None visualized. Maternal uterus/adnexae: Normal ovaries. No abnormal pelvic fluid collections. IMPRESSION: Single living intrauterine gestation measuring 7 weeks 0 days by crown-rump length. Electronically Signed   By: Ellery Plunk M.D.   On: 11/16/2016 04:23    Procedures   ____________________________________________   INITIAL IMPRESSION / ASSESSMENT AND PLAN / ED COURSE  Pertinent labs & imaging results that were available during my care of the patient were reviewed by me and considered in my medical decision making (see chart for details).  Patient was able eat a meal in the emergency department without any nausea or vomiting. Spoke with the patient length regarding Reglan prescription.      ____________________________________________  FINAL CLINICAL IMPRESSION(S) / ED DIAGNOSES  Final diagnoses:  Morning sickness     MEDICATIONS GIVEN DURING THIS VISIT:  Medications - No data to display   NEW OUTPATIENT MEDICATIONS STARTED DURING THIS VISIT:  New Prescriptions   No medications on file    Modified Medications   No medications on file    Discontinued Medications   No medications on file     Note:  This document was prepared using Dragon  voice recognition software and may include unintentional dictation errors.    Darci Current, MD 11/16/16 [id number]    Darci Current, MD 11/16/16 [phone]

## 2016-11-16 NOTE — ED Notes (Signed)
Pt reports no c/o N/V after completing meal tray and drink.

## 2016-11-16 NOTE — ED Notes (Signed)
Pt returned to ED Rm 6 from US at this time.

## 2016-11-23 ENCOUNTER — Encounter: Payer: Self-pay | Admitting: Advanced Practice Midwife

## 2016-11-27 ENCOUNTER — Ambulatory Visit: Payer: Self-pay

## 2016-11-27 ENCOUNTER — Ambulatory Visit (INDEPENDENT_AMBULATORY_CARE_PROVIDER_SITE_OTHER): Payer: BLUE CROSS/BLUE SHIELD | Admitting: Advanced Practice Midwife

## 2016-11-27 VITALS — BP 124/68 | Wt 209.0 lb

## 2016-11-27 DIAGNOSIS — Z3490 Encounter for supervision of normal pregnancy, unspecified, unspecified trimester: Secondary | ICD-10-CM | POA: Diagnosis not present

## 2016-11-27 DIAGNOSIS — Z3A08 8 weeks gestation of pregnancy: Secondary | ICD-10-CM

## 2016-11-27 DIAGNOSIS — Z3A01 Less than 8 weeks gestation of pregnancy: Secondary | ICD-10-CM

## 2016-11-27 DIAGNOSIS — Z3682 Encounter for antenatal screening for nuchal translucency: Secondary | ICD-10-CM

## 2016-11-27 NOTE — Progress Notes (Signed)
Having N&V sample of bonjesta given. Patient has decided to have genetic screening and will rtc in 4 weeks for 1st trimester screen. Need to [facility] screen [facility]. EDD adjusted based on dating scan today for more than 5 days difference by 8 wk u/s.

## 2016-11-27 NOTE — Progress Notes (Signed)
N&V..has not started the Reglan Dating U/S today Early GTT

## 2016-11-28 LAB — GLUCOSE TOLERANCE, 1 HOUR: GLUCOSE, 1HR PP: 110 mg/dL (ref 65–199)

## 2016-12-14 ENCOUNTER — Ambulatory Visit (INDEPENDENT_AMBULATORY_CARE_PROVIDER_SITE_OTHER): Payer: BLUE CROSS/BLUE SHIELD | Admitting: Obstetrics and Gynecology

## 2016-12-14 VITALS — BP 122/72 | Wt 210.0 lb

## 2016-12-14 DIAGNOSIS — F411 Generalized anxiety disorder: Secondary | ICD-10-CM

## 2016-12-14 DIAGNOSIS — Z3490 Encounter for supervision of normal pregnancy, unspecified, unspecified trimester: Secondary | ICD-10-CM | POA: Diagnosis not present

## 2016-12-14 NOTE — Progress Notes (Signed)
Consultation for anxiety

## 2016-12-16 NOTE — Patient Instructions (Signed)
Obstetrics & Gynecology Office Visit   Chief Complaint:  Chief Complaint  Patient presents with  . ROB    anxiety    History of Present Illness: The patient is a 26 y.o. female presenting initial evaluation for symptoms of anxiety.  The patient is currently taking nothing for the management of her symptoms.  She has had any recent situational stressors, pregnancy in the setting of prior miscarriage.  She reports symptoms of insomnia, irritability, social anxiety and agorophobia.  She denies anhedonia, day time somnolence, risk taking behavior, feelings of guilt, feelings of worthlessness, suicidal ideation, homicidal ideation, auditory hallucinations and visual hallucinations. Symptoms have worsened since initial onset.     The patient does not have a pre-existing history of depression and anxiety.  She  does not a prior history of suicide attempts. She currently does not want to start pharmacotherapy but is wondering about other options to minimize anxiety.  No fetal movement, no contractions, no vaginal bleeding or loss of fluid  Review of Systems: 10 point review of systems negative unless otherwise noted in HPI  Past Medical History:  Past Medical History:  Diagnosis Date  . Abdominal pain   . Amenorrhea   . Chlamydia 2012   tx'd  . Family history of ovarian cancer 07/2015   genetic testing letter sent  . Hemorrhoids   . [facility]    Past Surgical History:  Past Surgical History:  Procedure Laterality Date  . DILATION AND CURETTAGE OF UTERUS  06/01/2014    Gynecologic History: Patient's last menstrual period was 09/20/2016.  Obstetric History: G2P0010  Family History:  Family History  Problem Relation Age of Onset  . Diabetes Maternal Grandmother   . Ovarian cancer Maternal Grandmother [id number]    Social History:  Social History   Social History  . Marital status: Married    Spouse name: N/A  . Number of children: 0  . 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 Smoking status: Never Smoker  . Smokeless tobacco: Never Used  . Alcohol use No  . Drug use: No  . Sexual activity: Yes    Birth control/ protection: None   Other Topics Concern  . Not on file   Social History Narrative  . No narrative on file    Allergies:  Allergies  Allergen Reactions  . Ibuprofen     Swelling     Medications: Prior to Admission medications   Medication Sig Start Date End Date Taking? Authorizing Provider  metoCLOPramide (REGLAN) 10 MG tablet Take 1 tablet (10 mg total) by mouth every 8 (eight) hours as needed for nausea. 11/16/16 12/16/16 Yes Darci CurrentBrown, [place] N, MD  terconazole (TERAZOL 7) 0.4 % vaginal cream PLACE 1 APPLICATOR VAGINALLY NIGHTLY FOR 7 NIGHTS, THEN ONCE WEEKLY FOR 3 MONTHS AS MAINTENANCE 10/02/16  Yes [provider]    Physical Exam Vitals:  Vitals:   12/14/16 1415  BP: 122/72   Patient's last menstrual period was 09/20/2016.  General: NAD HEENT: normocephalic, anicteric Pulmonary: No increased work of breathing Abdomen: NABS, soft, non-tender, non-distended.  Umbilicus without lesions.  No hepatomegaly, splenomegaly or masses palpable. No evidence of hernia, fetal heartones 160 Genitourinary:  External: Normal external female genitalia.  Normal urethral meatus, normal  Bartholin's and Skene's glands.    Vagina: Normal vaginal mucosa, no evidence of prolapse.    Cervix: Grossly normal in  appearance, no bleeding  Uterus: Non-enlarged, mobile, normal contour.  No CMT  Adnexa: ovaries non-enlarged, no adnexal masses  Rectal: deferred  Lymphatic: no evidence of inguinal lymphadenopathy Extremities: no edema, erythema, or tenderness Neurologic: Grossly intact Psychiatric: mood appropriate, affect full  Female chaperone present for pelvic and breast  portions of the physical exam  Assessment: 26 y.o. G2P0010 generalized anxiety  order  Plan: Problem List Items Addressed This Visit      Other   Encounter for supervision of low-risk pregnancy, antepartum    Other Visit Diagnoses    Generalized anxiety disorder    -  Primary     - Discussed that symptoms of anxiety are not uncommon in pregnancy, particularly a pregnancy following a prior miscariage.  We discussed decreased risk of miscariage following completion of first trimester, as well as after documentation of FHT's.  We discussed her prior miscarriage does not put her at increased risk of subsequent miscarriage.  I discussed pros and cons of pharmacotherapy and she at this time is not interested in starting anything.  Should symptoms persist or worsen I would recommend starting pharmacotherapy.  At this time no SA/SI.   - A total of 15 minutes were spent in face-to-face contact with the patient during this encounter with over half of that time devoted to counseling and coordination of care.

## 2016-12-17 ENCOUNTER — Encounter: Payer: Self-pay | Admitting: Emergency Medicine

## 2016-12-17 ENCOUNTER — Telehealth: Payer: Self-pay

## 2016-12-17 ENCOUNTER — Emergency Department
Admission: EM | Admit: 2016-12-17 | Discharge: 2016-12-17 | Disposition: A | Discharge status: Home / Self Care | Payer: BLUE CROSS/BLUE SHIELD | Attending: Emergency Medicine | Admitting: Emergency Medicine

## 2016-12-17 DIAGNOSIS — J029 Acute pharyngitis, unspecified: Secondary | ICD-10-CM

## 2016-12-17 DIAGNOSIS — R070 Pain in throat: Secondary | ICD-10-CM | POA: Insufficient documentation

## 2016-12-17 DIAGNOSIS — K226 Gastro-esophageal laceration-hemorrhage syndrome: Secondary | ICD-10-CM | POA: Insufficient documentation

## 2016-12-17 DIAGNOSIS — R112 Nausea with vomiting, unspecified: Secondary | ICD-10-CM

## 2016-12-17 DIAGNOSIS — Z79899 Other long term (current) drug therapy: Secondary | ICD-10-CM | POA: Diagnosis not present

## 2016-12-17 DIAGNOSIS — Z3490 Encounter for supervision of normal pregnancy, unspecified, unspecified trimester: Secondary | ICD-10-CM | POA: Diagnosis not present

## 2016-12-17 DIAGNOSIS — K920 Hematemesis: Secondary | ICD-10-CM | POA: Diagnosis present

## 2016-12-17 LAB — COMPREHENSIVE METABOLIC PANEL
ALBUMIN: 3.8 g/dL (ref 3.5–5.0)
ALT: 11 U/L — ABNORMAL LOW (ref 14–54)
AST: 22 U/L (ref 15–41)
Alkaline Phosphatase: 54 U/L (ref 38–126)
Anion gap: 8 (ref 5–15)
BUN: 7 mg/dL (ref 6–20)
CHLORIDE: 107 mmol/L (ref 101–111)
CO2: 22 mmol/L (ref 22–32)
Calcium: 9.1 mg/dL (ref 8.9–10.3)
Creatinine, Ser: 0.61 mg/dL (ref 0.44–1.00)
GFR calc Af Amer: >60 mL/min (ref >60)
GFR calc non Af Amer: >60 mL/min (ref >60)
Glucose, Bld: 102 mg/dL — ABNORMAL HIGH (ref 65–99)
POTASSIUM: 3.7 mmol/L (ref 3.5–5.1)
SODIUM: 137 mmol/L (ref 135–145)
Total Bilirubin: 0.4 mg/dL (ref 0.3–1.2)
Total Protein: 7.3 g/dL (ref 6.5–8.1)

## 2016-12-17 LAB — CBC
HEMATOCRIT: 32.9 % — AB (ref 35.0–47.0)
Hemoglobin: 11 g/dL — ABNORMAL LOW (ref 12.0–16.0)
MCH: 23.9 pg — ABNORMAL LOW (ref 26.0–34.0)
MCHC: 33.4 g/dL (ref 32.0–36.0)
MCV: 71.5 fL — AB (ref 80.0–100.0)
Platelets: 296 10*3/uL (ref 150–440)
RBC: 4.61 MIL/uL (ref 3.80–5.20)
RDW: 16.4 % — AB (ref 11.5–14.5)
WBC: 10.9 10*3/uL (ref 3.6–11.0)

## 2016-12-17 LAB — POCT RAPID STREP A: STREPTOCOCCUS, GROUP A SCREEN (DIRECT): NEGATIVE

## 2016-12-17 MED ORDER — METOCLOPRAMIDE HCL 5 MG/ML IJ SOLN
10.0000 mg | Freq: Once | INTRAMUSCULAR | Status: DC
  Filled 2016-12-17: qty 2

## 2016-12-17 MED ORDER — FAMOTIDINE 20 MG PO TABS
20.0000 mg | ORAL_TABLET | Freq: Once | ORAL | Status: AC
  Administered 2016-12-17: 20 mg via ORAL
  Filled 2016-12-17: qty 1

## 2016-12-17 MED ORDER — FAMOTIDINE 20 MG PO TABS: 20.0000 mg | ORAL_TABLET | Freq: Two times a day (BID) | ORAL | 0 refills | Status: DC

## 2016-12-17 MED ORDER — ALUM & MAG HYDROXIDE-SIMETH 200-200-20 MG/5ML PO SUSP
30.0000 mL | Freq: Once | ORAL | Status: AC
  Administered 2016-12-17: 30 mL via ORAL
  Filled 2016-12-17: qty 30

## 2016-12-17 MED ORDER — ALUMINUM-MAGNESIUM-SIMETHICONE 200-200-20 MG/5ML PO SUSP: 30.0000 mL | Freq: Three times a day (TID) | ORAL | 0 refills | Status: DC

## 2016-12-17 MED ORDER — METOCLOPRAMIDE HCL 10 MG PO TABS: 10.0000 mg | ORAL_TABLET | Freq: Three times a day (TID) | ORAL | 0 refills | Status: DC | PRN

## 2016-12-17 MED ORDER — ACETAMINOPHEN 325 MG PO TABS
650.0000 mg | ORAL_TABLET | Freq: Once | ORAL | Status: AC
  Administered 2016-12-17: 650 mg via ORAL
  Filled 2016-12-17: qty 2

## 2016-12-17 NOTE — ED Provider Notes (Signed)
[facility] Emergency Department Provider Note   ____________________________________________   First MD Initiated Contact with Patient 12/17/16 0149     (approximate)  I have reviewed the triage vital signs and the nursing notes.   HISTORY  Chief Complaint Hematemesis    HPI Deborah House is a 26 y.o. female who comes into the hospital today vomiting blood. She reports this started at midnight. Initially she vomited regular and then she noticed that she had some emesis of corn with some blood in it. She reports that an hour later she vomited didn't appear black with some blood in it. The patient states that her throat hurts. Her pain is about 8-9 out of 10 in intensity. The patient has not taken anything for pain. She is pregnant so she has had morning sickness but states that she last vomited on Friday. The patient has all day nausea. The patient denies any abdominal pain. She states that when she swallows it feels like something is in her throat but she is also not had any diarrhea. The patient came in to the hospital for evaluation of her symptoms.   Past Medical History:  Diagnosis Date  . Abdominal pain   . Amenorrhea   . Chlamydia 2012   tx'd  . Family history of ovarian cancer 07/2015   genetic testing letter sent  . Hemorrhoids   . Missed abortion 06/01/2014   [facility]    Patient Active Problem List   Diagnosis Date Noted  . Encounter for supervision of low-risk pregnancy, antepartum 11/07/2016  . Obesity affecting pregnancy in first trimester 11/07/2016    Past Surgical History:  Procedure Laterality Date  . DILATION AND CURETTAGE OF UTERUS  06/01/2014    Prior to Admission medications   Medication Sig Start Date End Date Taking? Authorizing Provider  aluminum-magnesium hydroxide-simethicone (MAALOX) 200-200-20 MG/5ML SUSP Take 30 mLs by mouth 4 (four) times daily -  before meals and at bedtime. 12/17/16   Rebecka ApleyWebster, Joely Losier P, MD    famotidine (PEPCID) 20 MG tablet Take 1 tablet (20 mg total) by mouth 2 (two) times daily. 12/17/16 12/17/17  Rebecka ApleyWebster, Syana Degraffenreid P, MD  metoCLOPramide (REGLAN) 10 MG tablet Take 1 tablet (10 mg total) by mouth every 8 (eight) hours as needed for nausea. 11/16/16 12/16/16  Darci CurrentBrown, [place] N, MD  metoCLOPramide (REGLAN) 10 MG tablet Take 1 tablet (10 mg total) by mouth every 8 (eight) hours as needed. 12/17/16   Rebecka ApleyWebster, Brinley Rosete P, MD  terconazole (TERAZOL 7) 0.4 % vaginal cream PLACE 1 APPLICATOR VAGINALLY NIGHTLY FOR 7 NIGHTS, THEN ONCE WEEKLY FOR 3 MONTHS AS MAINTENANCE 10/02/16   [provider]    Allergies Ibuprofen  Family History  Problem Relation Age of Onset  . Diabetes Maternal Grandmother   . Ovarian cancer Maternal Grandmother 2560    Social History Social History  Substance Use Topics  . Smoking status: Never Smoker  . Smokeless tobacco: Never Used  . Alcohol use No    Review of Systems  Constitutional: No fever/chills Eyes: No visual changes. ENT:  sore throat. Cardiovascular: Denies chest pain. Respiratory: Denies shortness of breath. Gastrointestinal: Nausea and hematemesis, No abdominal pain. No diarrhea.  No constipation. Genitourinary: Negative for dysuria. Musculoskeletal: Negative for back pain. Skin: Negative for rash. Neurological: Negative for headaches, focal weakness or numbness.   ____________________________________________   PHYSICAL EXAM:  VITAL SIGNS: ED Triage Vitals  Enc Vitals Group     BP 12/17/16 0132 128/80  Pulse Rate 12/17/16 0132 90     Resp 12/17/16 0132 18     Temp 12/17/16 0132 98.4 F (36.9 C)     Temp Source 12/17/16 0132 Oral     SpO2 12/17/16 0132 100 %     Weight 12/17/16 0133 210 lb (95.3 kg)     Height 12/17/16 0133 5\' 6"  (1.676 m)     Head Circumference --      Peak Flow --      Pain Score 12/17/16 0132 8     Pain Loc --      Pain Edu? --      Excl. in GC? --     Constitutional: Alert and oriented.  Well appearing and in Mild distress. Eyes: Conjunctivae are normal. PERRL. EOMI. Head: Atraumatic. Nose: No congestion/rhinnorhea. Mouth/Throat: Mucous membranes are moist.  Oropharynx non-erythematous. Cardiovascular: Normal rate, regular rhythm. Grossly normal heart sounds.  Good peripheral circulation. Respiratory: Normal respiratory effort.  No retractions. Lungs CTAB. Gastrointestinal: Soft and nontender. No distention. Positive bowel sounds Musculoskeletal: No lower extremity tenderness nor edema.   Neurologic:  Normal speech and language.  Skin:  Skin is warm, dry and intact.  Psychiatric: Mood and affect are normal.   ____________________________________________   LABS (all labs ordered are listed, but only abnormal results are displayed)  Labs Reviewed  COMPREHENSIVE METABOLIC PANEL - Abnormal; Notable for the following:       Result Value   Glucose, Bld 102 (*)    ALT 11 (*)    All other components within normal limits  CBC - Abnormal; Notable for the following:    Hemoglobin 11.0 (*)    HCT 32.9 (*)    MCV 71.5 (*)    MCH 23.9 (*)    RDW 16.4 (*)    All other components within normal limits  CULTURE, GROUP A STREP [facility])  POCT RAPID STREP A   ____________________________________________  EKG  none ____________________________________________  RADIOLOGY  No results found.  ____________________________________________   PROCEDURES  Procedure(s) performed: None  Procedures  Critical Care performed: No  ____________________________________________   INITIAL IMPRESSION / ASSESSMENT AND PLAN / ED COURSE  Pertinent labs & imaging results that were available during my care of the patient were reviewed by me and considered in my medical decision making (see chart for details).  This is a 26 year old female who comes into the hospital today with some vomiting blood. She had she reports 2 episodes of emesis with blood in it. She states that was bright  red and showed a picture where it looked like it was a circle on the ground mixed with food. I did give the patient some Reglan as well as some Tylenol for her sore throat. I gave the patient some Maalox and Pepcid as well in the event that this was due to gastritis versus a Mallory-Weiss syndrome. I will ensure that the patient is able to drink without any vomiting and I will then discharge the patient to home. We did send a throat culture and we will assess it as need be. The patient has no abdominal pain and no other complaints.     The patient was complaining of some continued sore throat but I informed her that there is only somewhat so we could give her since she is pregnant. The patient was able to drink without any vomiting and no hematemesis here in the emergency department. Her hemoglobin is improved from previous which was in June. The patient will be discharged  home to follow-up with her OB/GYN or her primary care physician for further evaluation of her symptoms. ____________________________________________   FINAL CLINICAL IMPRESSION(S) / ED DIAGNOSES  Final diagnoses:  Non-intractable vomiting with nausea, unspecified vomiting type  Mallory-Weiss syndrome  Sore throat      NEW MEDICATIONS STARTED DURING THIS VISIT:  Discharge Medication List as of 12/17/2016  5:31 AM    START taking these medications   Details  aluminum-magnesium hydroxide-simethicone (MAALOX) 200-200-20 MG/5ML SUSP Take 30 mLs by mouth 4 (four) times daily -  before meals and at bedtime., Starting Mon 12/17/2016, Print    famotidine (PEPCID) 20 MG tablet Take 1 tablet (20 mg total) by mouth 2 (two) times daily., Starting Mon 12/17/2016, Until Tue 12/17/2017, Print         Note:  This document was prepared using Dragon voice recognition software and may include unintentional dictation errors.    Rebecka ApleyWebster, Anjanae Woehrle P, MD 12/17/16 [phone]

## 2016-12-17 NOTE — Discharge Instructions (Signed)
Please follow-up with your OB/GYN or with the acute care clinic.

## 2016-12-17 NOTE — ED Notes (Signed)
Pt states is [redacted] weeks pregnant G2P0A1. Pt states she has been having emesis almost daily with pregnancy. Pt states approx one hour pta she vomited "corn with blood at the end". Pt states she is worried "the blood will come out of my umbilical cord". Pt states after corn/blood emesis she vomited a second time with "black in it". Pt denies pain, dizziness, diarrhea, fever.

## 2016-12-17 NOTE — Telephone Encounter (Signed)
Pt called after hour nurse c/o vomiting blood at 11wks preg.  Was adv to go to ER which she did.  They ran some test, gave her some pepcid and reglan.  Pt was told to f/u either c us or her primary care.  Her next appt c us is 8/7. Left detailed msg to call for ER f/u appt.

## 2016-12-17 NOTE — ED Notes (Signed)
Report to rachel, rn.  

## 2016-12-17 NOTE — ED Notes (Signed)
Rapid strep negative. MD informed

## 2016-12-17 NOTE — ED Triage Notes (Signed)
Patient states that she is [redacted] weeks pregnant. Patient states that she vomited times 2 today. Patient reports that the first time she vomited it was bright red blood and the second time it was black. Patient denies abdominal pain.

## 2016-12-19 LAB — CULTURE, GROUP A STREP (THRC)

## 2016-12-20 ENCOUNTER — Other Ambulatory Visit: Payer: Self-pay

## 2016-12-20 ENCOUNTER — Telehealth: Payer: Self-pay

## 2016-12-20 DIAGNOSIS — O219 Vomiting of pregnancy, unspecified: Secondary | ICD-10-CM

## 2016-12-20 MED ORDER — DOXYLAMINE-PYRIDOXINE ER 20-20 MG PO TBCR: 1.0000 | EXTENDED_RELEASE_TABLET | Freq: Two times a day (BID) | ORAL | 1 refills | Status: DC | PRN

## 2016-12-20 NOTE — Telephone Encounter (Signed)
Appointment with JEG 8/7. AMS is out of the office today

## 2016-12-20 NOTE — Telephone Encounter (Signed)
Pt requesting rx for nausea medication.  [phone]

## 2016-12-20 NOTE — Telephone Encounter (Signed)
Can you let her know that I sent in Rx for bonjesta. Thanks

## 2016-12-20 NOTE — Telephone Encounter (Signed)
Pt aware of Rx being sent in. I had to change the pharmacy d/t it was sent to the wrong one. [facility] was [facility] [address]

## 2016-12-25 ENCOUNTER — Ambulatory Visit (INDEPENDENT_AMBULATORY_CARE_PROVIDER_SITE_OTHER): Payer: BLUE CROSS/BLUE SHIELD

## 2016-12-25 ENCOUNTER — Ambulatory Visit (INDEPENDENT_AMBULATORY_CARE_PROVIDER_SITE_OTHER): Payer: BLUE CROSS/BLUE SHIELD | Admitting: Advanced Practice Midwife

## 2016-12-25 VITALS — BP 124/70 | Wt 209.0 lb

## 2016-12-25 DIAGNOSIS — Z3682 Encounter for antenatal screening for nuchal translucency: Secondary | ICD-10-CM | POA: Diagnosis not present

## 2016-12-25 DIAGNOSIS — Z3A12 12 weeks gestation of pregnancy: Secondary | ICD-10-CM

## 2016-12-25 DIAGNOSIS — Z1379 Encounter for other screening for genetic and chromosomal anomalies: Secondary | ICD-10-CM

## 2016-12-25 NOTE — Progress Notes (Addendum)
1st trimester/NT screen today. NT 1.0 mm, CRL 64.3 mm. Nasal Bone present. Still having N&V. 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 Encouraged to eat small frequent meals. Patient has 2 week follow up appointment already scheduled for pregnancy related anxiety.

## 2016-12-25 NOTE — Progress Notes (Signed)
NT scan today N&V

## 2016-12-27 IMAGING — CR DG ABDOMEN 1V
1 series · 2 of 2 positions shown · non-contrast
Comparison: CT abdomen and pelvis 04/30/2012

CLINICAL DATA: Constipation.  Nonhealing anal fissures.

EXAM:
ABDOMEN - 1 VIEW

[Series 1: dxr kidney ureter bladder · 0.14mm/px · 2 of 2 slices shown]
[im 1/2]
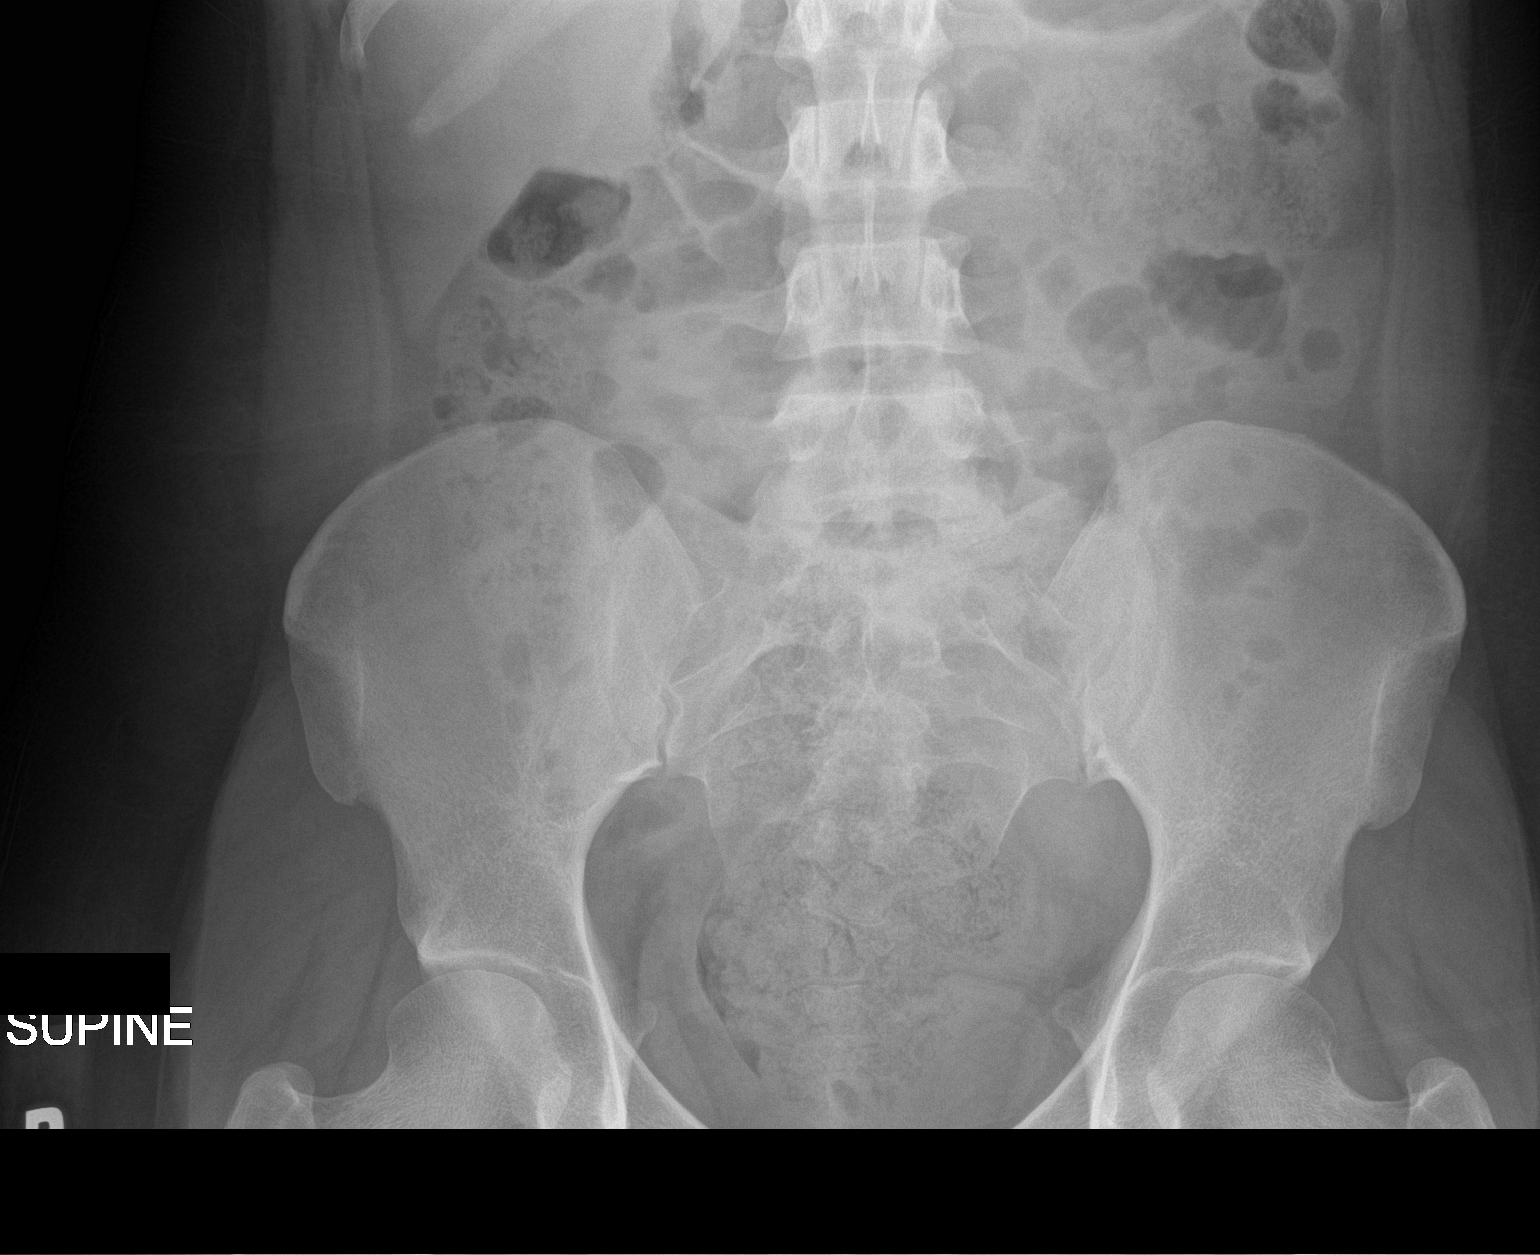
[im 2/2]
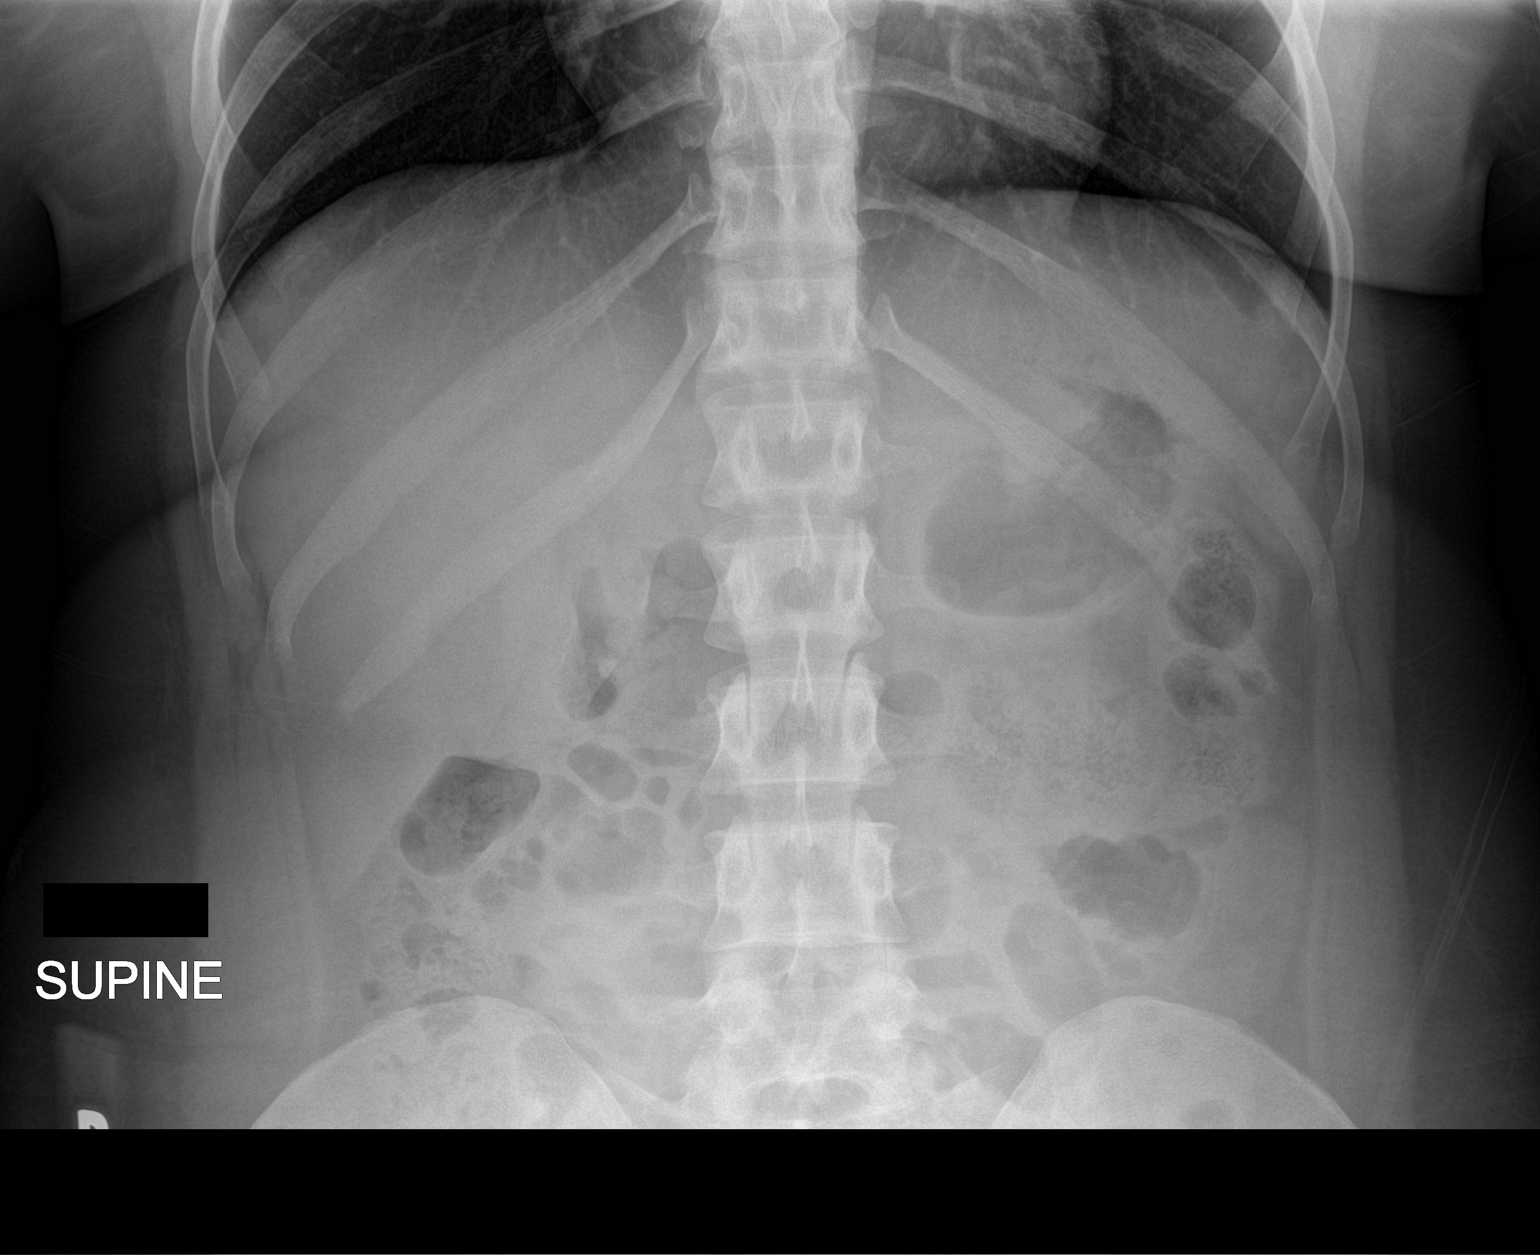

[2 of 2 positions shown; findings below may reference images not displayed]

FINDINGS: There is a moderate amount of stool in the colon and rectum. Gas is
present in nondilated loops of small and large bowel without
evidence of obstruction. No abnormal soft tissue calcification or
acute osseous abnormality is seen. Visualized lung bases are clear.
IMPRESSION: Moderate amount of colonic stool.  No evidence of bowel obstruction.

## 2016-12-28 LAB — FIRST TRIMESTER SCREEN W/NT
CRL: 64.3 mm
DIA MOM: 2.1
DIA Value: 405.9 pg/mL
Gest Age-Collect: 12.7 weeks
HCG MOM: 1.81
HCG VALUE: 126 [IU]/mL
MATERNAL AGE AT EDD: 28.9 a
NUCHAL TRANSLUCENCY: 1 mm
NUMBER OF FETUSES: 1
Nuchal Translucency MoM: 0.62
PAPP-A MoM: 2.33
PAPP-A Value: 1566.9 ng/mL
Test Results:: NEGATIVE
Weight: 209 [lb_av]

## 2017-01-10 ENCOUNTER — Ambulatory Visit (INDEPENDENT_AMBULATORY_CARE_PROVIDER_SITE_OTHER): Payer: BLUE CROSS/BLUE SHIELD | Admitting: Obstetrics and Gynecology

## 2017-01-10 VITALS — BP 100/60 | Wt 206.0 lb

## 2017-01-10 DIAGNOSIS — Z3A15 15 weeks gestation of pregnancy: Secondary | ICD-10-CM

## 2017-01-10 DIAGNOSIS — O219 Vomiting of pregnancy, unspecified: Secondary | ICD-10-CM

## 2017-01-10 DIAGNOSIS — Z3490 Encounter for supervision of normal pregnancy, unspecified, unspecified trimester: Secondary | ICD-10-CM

## 2017-01-10 MED ORDER — DOXYLAMINE-PYRIDOXINE 10-10 MG PO TBEC: 2.0000 | DELAYED_RELEASE_TABLET | Freq: Every day | ORAL | 2 refills | Status: DC

## 2017-01-10 NOTE — Progress Notes (Signed)
Pos PNVs. No VB, LOF. Anxiety sx improving. Still has NVP but less. Rx diclegis eRxd to see if covered instead of Bonjesta. Pt has ROB already scheduled 9/4. Can keep that or RTO in 4 wks, whatever is better for her sx/anxiety.

## 2017-01-10 NOTE — Progress Notes (Signed)
Pt reports being out of pepcid and bonjesta, still vomiting but less frequently. Insurance still not covering bonjesta.

## 2017-01-11 ENCOUNTER — Encounter: Payer: BLUE CROSS/BLUE SHIELD | Admitting: Obstetrics and Gynecology

## 2017-01-22 ENCOUNTER — Ambulatory Visit (INDEPENDENT_AMBULATORY_CARE_PROVIDER_SITE_OTHER): Payer: BLUE CROSS/BLUE SHIELD | Admitting: Obstetrics & Gynecology

## 2017-01-22 VITALS — BP 120/70 | Wt 207.0 lb

## 2017-01-22 DIAGNOSIS — O99211 Obesity complicating pregnancy, first trimester: Secondary | ICD-10-CM

## 2017-01-22 DIAGNOSIS — Z3490 Encounter for supervision of normal pregnancy, unspecified, unspecified trimester: Secondary | ICD-10-CM

## 2017-01-22 DIAGNOSIS — Z3A16 16 weeks gestation of pregnancy: Secondary | ICD-10-CM

## 2017-01-22 NOTE — Progress Notes (Signed)
PNV, US nv, nausea improving (taking Diclegis)

## 2017-01-22 NOTE — Patient Instructions (Signed)

## 2017-02-08 ENCOUNTER — Encounter: Payer: BLUE CROSS/BLUE SHIELD | Admitting: Maternal Newborn

## 2017-02-19 ENCOUNTER — Ambulatory Visit (INDEPENDENT_AMBULATORY_CARE_PROVIDER_SITE_OTHER): Payer: BLUE CROSS/BLUE SHIELD

## 2017-02-19 ENCOUNTER — Ambulatory Visit (INDEPENDENT_AMBULATORY_CARE_PROVIDER_SITE_OTHER): Payer: BLUE CROSS/BLUE SHIELD | Admitting: Obstetrics and Gynecology

## 2017-02-19 VITALS — BP 108/62 | Wt 210.0 lb

## 2017-02-19 DIAGNOSIS — N898 Other specified noninflammatory disorders of vagina: Secondary | ICD-10-CM

## 2017-02-19 DIAGNOSIS — B373 Candidiasis of vulva and vagina: Secondary | ICD-10-CM | POA: Diagnosis not present

## 2017-02-19 DIAGNOSIS — Z362 Encounter for other antenatal screening follow-up: Secondary | ICD-10-CM

## 2017-02-19 DIAGNOSIS — Z113 Encounter for screening for infections with a predominantly sexual mode of transmission: Secondary | ICD-10-CM

## 2017-02-19 DIAGNOSIS — Z0489 Encounter for examination and observation for other specified reasons: Secondary | ICD-10-CM

## 2017-02-19 DIAGNOSIS — Z3A20 20 weeks gestation of pregnancy: Secondary | ICD-10-CM

## 2017-02-19 DIAGNOSIS — Z3A16 16 weeks gestation of pregnancy: Secondary | ICD-10-CM

## 2017-02-19 DIAGNOSIS — O4442 Low lying placenta NOS or without hemorrhage, second trimester: Secondary | ICD-10-CM | POA: Insufficient documentation

## 2017-02-19 DIAGNOSIS — Z3490 Encounter for supervision of normal pregnancy, unspecified, unspecified trimester: Secondary | ICD-10-CM

## 2017-02-19 DIAGNOSIS — IMO0002 Reserved for concepts with insufficient information to code with codable children: Secondary | ICD-10-CM

## 2017-02-19 DIAGNOSIS — B3731 Acute candidiasis of vulva and vagina: Secondary | ICD-10-CM

## 2017-02-19 DIAGNOSIS — O26892 Other specified pregnancy related conditions, second trimester: Secondary | ICD-10-CM

## 2017-02-19 DIAGNOSIS — O99211 Obesity complicating pregnancy, first trimester: Secondary | ICD-10-CM

## 2017-02-19 MED ORDER — FLUCONAZOLE 150 MG PO TABS: 150.0000 mg | ORAL_TABLET | Freq: Once | ORAL | 0 refills | Status: AC

## 2017-02-19 NOTE — Progress Notes (Signed)
Aptima today

## 2017-02-19 NOTE — Progress Notes (Signed)
Anatomy scan today/ It's a girl  

## 2017-02-20 NOTE — Progress Notes (Signed)
    Routine Prenatal Care Visit  Subjective  Deborah House is a 26 y.o. G2P0010 at [redacted]w[redacted]d being seen today for ongoing prenatal care.  She is currently monitored for the following issues for this low-risk pregnancy and has Encounter for supervision of low-risk pregnancy, antepartum; Obesity affecting pregnancy in first trimester; Nausea and vomiting in pregnancy; and Low lying placenta nos or without hemorrhage, second trimester on her problem list.  ----------------------------------------------------------------------------------- Patient reports vaginal discharge and irritation.   Contractions: Not present. Vag. Bleeding: None.  Movement: Present. Denies leaking of fluid.  ----------------------------------------------------------------------------------- The following portions of the patient's history were reviewed and updated as appropriate: allergies, current medications, past family history, past medical history, past social history, past surgical history and problem list. Problem list updated.   Objective  Blood pressure 108/62, weight 210 lb (95.3 kg), last menstrual period 09/20/2016. Pregravid weight 210 lb (95.3 kg) Total Weight Gain 0 lb (0 kg) Urinalysis: Urine Protein: 1+ Urine Glucose: Negative  Fetal Status: Fetal Heart Rate (bpm): 145   Movement: Present     General:  Alert, oriented and cooperative. Patient is in no acute distress.  Skin: Skin is warm and dry. No rash noted.   Cardiovascular: Normal heart rate noted  Respiratory: Normal respiratory effort, no problems with respiration noted  Abdomen: Soft, gravid, appropriate for gestational age. Pain/Pressure: Absent     Pelvic:  Cervical exam deferred        Extremities: Normal range of motion.     ental Status: Normal mood and affect. Normal behavior. Normal judgment and thought content.     Assessment   26 y.o. G2P0010 at [redacted]w[redacted]d by  07/04/2017, by Ultrasound presenting for routine prenatal visit  Plan    pregnancy2 Problems (from 09/20/16 to present)    No problems associated with this episode.       Preterm labor symptoms and general obstetric precautions including but not limited to vaginal bleeding, contractions, leaking of fluid and fetal movement were reviewed in detail with the patient. Please refer to After Visit Summary for other counseling recommendations.  - low lying placenta follow up at 28 weeks if not resolved at follow up anatomy scan at 24 weeks - Rx flagyl for BV, Nuswab sent  Return in about 4 weeks (around 03/19/2017) for ROB and follow up anatomy scan.

## 2017-02-27 ENCOUNTER — Other Ambulatory Visit: Payer: Self-pay | Admitting: Obstetrics and Gynecology

## 2017-02-27 ENCOUNTER — Encounter: Payer: Self-pay | Admitting: Obstetrics and Gynecology

## 2017-02-27 LAB — NUSWAB VG+, CANDIDA 6SP
Atopobium vaginae: HIGH Score — AB
CANDIDA KRUSEI, NAA: NEGATIVE
CANDIDA PARAPSILOSIS, NAA: NEGATIVE
CHLAMYDIA TRACHOMATIS, NAA: NEGATIVE
Candida albicans, NAA: POSITIVE — AB
Candida glabrata, NAA: NEGATIVE
Candida lusitaniae, NAA: NEGATIVE
Candida tropicalis, NAA: NEGATIVE
Neisseria gonorrhoeae, NAA: NEGATIVE
TRICH VAG BY NAA: NEGATIVE

## 2017-02-27 MED ORDER — FLUCONAZOLE 150 MG PO TABS: 150.0000 mg | ORAL_TABLET | Freq: Once | ORAL | 0 refills | Status: AC

## 2017-03-19 ENCOUNTER — Ambulatory Visit (INDEPENDENT_AMBULATORY_CARE_PROVIDER_SITE_OTHER): Payer: BLUE CROSS/BLUE SHIELD

## 2017-03-19 ENCOUNTER — Ambulatory Visit (INDEPENDENT_AMBULATORY_CARE_PROVIDER_SITE_OTHER): Payer: BLUE CROSS/BLUE SHIELD | Admitting: Obstetrics and Gynecology

## 2017-03-19 ENCOUNTER — Telehealth: Payer: Self-pay

## 2017-03-19 VITALS — BP 124/78 | Wt 219.0 lb

## 2017-03-19 DIAGNOSIS — O99211 Obesity complicating pregnancy, first trimester: Secondary | ICD-10-CM

## 2017-03-19 DIAGNOSIS — Z113 Encounter for screening for infections with a predominantly sexual mode of transmission: Secondary | ICD-10-CM

## 2017-03-19 DIAGNOSIS — IMO0002 Reserved for concepts with insufficient information to code with codable children: Secondary | ICD-10-CM

## 2017-03-19 DIAGNOSIS — Z3490 Encounter for supervision of normal pregnancy, unspecified, unspecified trimester: Secondary | ICD-10-CM

## 2017-03-19 DIAGNOSIS — Z0489 Encounter for examination and observation for other specified reasons: Secondary | ICD-10-CM | POA: Diagnosis not present

## 2017-03-19 DIAGNOSIS — O4442 Low lying placenta NOS or without hemorrhage, second trimester: Secondary | ICD-10-CM

## 2017-03-19 DIAGNOSIS — Z3A24 24 weeks gestation of pregnancy: Secondary | ICD-10-CM

## 2017-03-19 DIAGNOSIS — O99210 Obesity complicating pregnancy, unspecified trimester: Secondary | ICD-10-CM

## 2017-03-19 NOTE — Progress Notes (Signed)
Routine Prenatal Care Visit  Subjective  Deborah House is a 26 y.o. G2P0010 at [redacted]w[redacted]d being seen today for ongoing prenatal care.  She is currently monitored for the following issues for this high-risk pregnancy and has Encounter for supervision of low-risk pregnancy, antepartum; Obesity affecting pregnancy; Nausea and vomiting in pregnancy; and Low lying placenta nos or without hemorrhage, second trimester on her problem list.  ----------------------------------------------------------------------------------- Patient reports no complaints and round ligament pain.   Contractions: Not present. Vag. Bleeding: None.  Movement: Present. Denies leaking of fluid.  ----------------------------------------------------------------------------------- The following portions of the patient's history were reviewed and updated as appropriate: allergies, current medications, past family history, past medical history, past social history, past surgical history and problem list. Problem list updated.   Objective  Blood pressure 124/78, weight 219 lb (99.3 kg), last menstrual period 09/20/2016. Pregravid weight 210 lb (95.3 kg) Total Weight Gain 9 lb (4.082 kg) Urinalysis: Urine Protein: Trace Urine Glucose: Negative  Fetal Status: Fetal Heart Rate (bpm): 130   Movement: Present  Presentation: Vertex  General:  Alert, oriented and cooperative. Patient is in no acute distress.  Skin: Skin is warm and dry. No rash noted.   Cardiovascular: Normal heart rate noted  Respiratory: Normal respiratory effort, no problems with respiration noted  Abdomen: Soft, gravid, appropriate for gestational age. Pain/Pressure: Present     Pelvic:  Cervical exam deferred        Extremities: Normal range of motion.     ental Status: Normal mood and affect. Normal behavior. Normal judgment and thought content.     Assessment   26 y.o. G2P0010 at [redacted]w[redacted]d by  07/04/2017, by Ultrasound presenting for routine prenatal  visit  Plan   pregnancy2 Problems (from 09/20/16 to present)    Problem Noted Resolved   Low lying placenta nos or without hemorrhage, second trimester 02/19/2017 by [name], MD No   Overview Signed 03/19/2017 10:27 AM by [name], MD    [ ]  Follow up 28 weeks      Encounter for supervision of low-risk pregnancy, antepartum 11/07/2016 by Nadara Mustard, MD No   Overview Addendum 03/19/2017 10:30 AM by [name], MD    Clinic [facility] Prenatal Labs  Dating 8 week [place] Blood type: O/Negative/-- (06/20 1416)   Genetic Screen  1st tri neg Antibody:Negative (06/20 1416)  Anatomic [place] 02/19/17 XX Complete on follow up 03/19/17 but low lying placenta Rubella: 1.68 (06/20 1416) Varicella: Immune  GTT Early:               Third trimester:  RPR: Non Reactive (06/20 1416)   Rhogam  HBsAg: Negative (06/20 1416)   TDaP vaccine                       Flu Shot: HIV: Negative  Baby Food                                GBS:   Contraception  Pap: 07/20/15 NIL  CBB     CS/VBAC    Support Person               Obesity affecting pregnancy 11/07/2016 by Nadara Mustard, MD No   Overview Signed 02/19/2017  9:59 AM by [name], MD    Body mass index is 33.89 kg/m.           Preterm labor symptoms and  general obstetric precautions including but not limited to vaginal bleeding, contractions, leaking of fluid and fetal movement were reviewed in detail with the patient. Please refer to After Visit Summary for other counseling recommendations.   - Anatomy scan complete for RVOT, placenta still low lying follow up at 28 weeks - 28 week labs next visit - Declines influenza vaccination  Return in about 4 weeks (around 04/16/2017) for follow up ultrasound placentation and 28 week labs with ROB in 1 month.

## 2017-03-19 NOTE — Progress Notes (Signed)
ROB Anatomy scan f/u  Pt is having some vaginal pain with sitting or laying down

## 2017-03-19 NOTE — Telephone Encounter (Signed)
FMLA/DISABILITY forms (2) for [employer] [facility] filled out and given to [place] for processing.

## 2017-04-05 ENCOUNTER — Ambulatory Visit (INDEPENDENT_AMBULATORY_CARE_PROVIDER_SITE_OTHER): Payer: BLUE CROSS/BLUE SHIELD | Admitting: Maternal Newborn

## 2017-04-05 ENCOUNTER — Encounter: Payer: Self-pay | Admitting: Maternal Newborn

## 2017-04-05 VITALS — BP 130/80 | Wt 221.0 lb

## 2017-04-05 DIAGNOSIS — B3731 Acute candidiasis of vulva and vagina: Secondary | ICD-10-CM

## 2017-04-05 DIAGNOSIS — B373 Candidiasis of vulva and vagina: Secondary | ICD-10-CM

## 2017-04-05 DIAGNOSIS — Z3490 Encounter for supervision of normal pregnancy, unspecified, unspecified trimester: Secondary | ICD-10-CM

## 2017-04-05 DIAGNOSIS — B372 Candidiasis of skin and nail: Secondary | ICD-10-CM

## 2017-04-05 MED ORDER — NYSTATIN 100000 UNIT/GM EX CREA: 1.0000 "application " | TOPICAL_CREAM | Freq: Two times a day (BID) | CUTANEOUS | 1 refills | Status: DC

## 2017-04-05 MED ORDER — TERCONAZOLE 0.4 % VA CREA: 1.0000 | TOPICAL_CREAM | Freq: Every day | VAGINAL | 3 refills | Status: DC

## 2017-04-05 NOTE — Progress Notes (Signed)
Routine Prenatal Care Visit  Subjective  Deborah House is a 26 y.o. G2P0010 at 4950w1d being seen today for ongoing prenatal care.  She is currently monitored for the following issues for this low-risk pregnancy and has Encounter for supervision of low-risk pregnancy, antepartum; Obesity affecting pregnancy; Nausea and vomiting in pregnancy; and Low lying placenta nos or without hemorrhage, second trimester on their problem list.  ----------------------------------------------------------------------------------- Patient reports vaginal irritation and yeast dermatitis in groin area.   Contractions: Not present. Vag. Bleeding: None.  Movement: Present. Denies leaking of fluid.  ----------------------------------------------------------------------------------- The following portions of the patient's history were reviewed and updated as appropriate: allergies, current medications, past family history, past medical history, past social history, past surgical history and problem list. Problem list updated.   Objective  Blood pressure 130/80, weight 221 lb (100.2 kg), last menstrual period 09/20/2016. Pregravid weight 210 lb (95.3 kg) Total Weight Gain 11 lb (4.99 kg) Urinalysis: Urine Protein: Trace Urine Glucose: Negative  Fetal Status: Fetal Heart Rate (bpm): 140 Fundal Height: 28 cm Movement: Present     General:  Alert, oriented and cooperative. Patient is in no acute distress.  Skin: Skin is warm and dry. Rash noted diffusely in groin area.   Cardiovascular: Normal heart rate noted  Respiratory: Normal respiratory effort, no problems with respiration noted  Abdomen: Soft, gravid, appropriate for gestational age. Pain/Pressure: Absent     Pelvic:  Cervical exam deferred        Extremities: Normal range of motion.  Edema: None  Mental Status: Normal mood and affect. Normal behavior. Normal judgment and thought content.     Assessment   26 y.o. G2P0010 at 5850w1d, EDD  07/04/2017, by Ultrasound presenting for routine prenatal visit.  Plan   pregnancy2 Problems (from 09/20/16 to present)    Problem Noted Resolved   Low lying placenta nos or without hemorrhage, second trimester 02/19/2017 by Vena AustriaStaebler, Andreas, MD No   Overview Signed 03/19/2017 10:27 AM by Vena AustriaStaebler, Andreas, MD    [ ]  Follow up 28 weeks      Encounter for supervision of low-risk pregnancy, antepartum 11/07/2016 by Nadara MustardHarris, Robert P, MD No   Overview Addendum 03/19/2017 10:51 AM by Vena AustriaStaebler, Andreas, MD    [facility]inic [facility] Prenatal Labs  Dating 8 week US Blood type: O/Negative/-- (06/20 1416)   Genetic Screen  1st tri neg Antibody:Negative (06/20 1416)  Anatomic US 02/19/17 XX Complete on follow up 03/19/17 but low lying placenta Rubella: 1.68 (06/20 1416) Varicella: Immune  GTT Early:               Third trimester:  RPR: Non Reactive (06/20 1416)   Rhogam [ ]  28 weeks HBsAg: Negative (06/20 1416)   TDaP vaccine                       Flu Shot: declined HIV: Negative  Baby Food                                GBS:   Contraception  Pap: 07/20/15 NIL  CBB     CS/VBAC    Support Person               Obesity affecting pregnancy 11/07/2016 by Nadara MustardHarris, Robert P, MD No   Overview Signed 02/19/2017  9:59 AM by Vena AustriaStaebler, Andreas, MD    Body mass index is 33.89 kg/m.  Recurrent yeast infections but stopped topical therapy because she ran out of medication. Refill sent for terconazole tx, advised to use for 7 nights and then weekly. Also sent Rx for nystatin to treat yeast dermatitis in groin.  Preterm labor symptoms and general obstetric precautions including but not limited to vaginal bleeding, contractions, leaking of fluid and fetal movement were reviewed in detail with the patient.  Keep scheduled appointment on 04/15/17 for ROB, ultrasound, 28 week labs.  Marcelyn BruinsJacelyn Schmid, CNM 04/05/2017  10:49 AM

## 2017-04-11 LAB — NUSWAB VG+, CANDIDA 6SP
CANDIDA GLABRATA, NAA: NEGATIVE
CANDIDA KRUSEI, NAA: NEGATIVE
CANDIDA LUSITANIAE, NAA: NEGATIVE
CHLAMYDIA TRACHOMATIS, NAA: NEGATIVE
Candida albicans, NAA: POSITIVE — AB
Candida parapsilosis, NAA: NEGATIVE
Candida tropicalis, NAA: NEGATIVE
NEISSERIA GONORRHOEAE, NAA: NEGATIVE
TRICH VAG BY NAA: NEGATIVE

## 2017-04-16 ENCOUNTER — Ambulatory Visit (INDEPENDENT_AMBULATORY_CARE_PROVIDER_SITE_OTHER): Payer: BLUE CROSS/BLUE SHIELD | Admitting: Maternal Newborn

## 2017-04-16 ENCOUNTER — Ambulatory Visit: Payer: BLUE CROSS/BLUE SHIELD

## 2017-04-16 ENCOUNTER — Ambulatory Visit (INDEPENDENT_AMBULATORY_CARE_PROVIDER_SITE_OTHER): Payer: BLUE CROSS/BLUE SHIELD

## 2017-04-16 ENCOUNTER — Other Ambulatory Visit: Payer: Self-pay | Admitting: Obstetrics and Gynecology

## 2017-04-16 ENCOUNTER — Encounter: Payer: Self-pay | Admitting: Maternal Newborn

## 2017-04-16 VITALS — BP 130/74 | Wt 218.0 lb

## 2017-04-16 DIAGNOSIS — O4442 Low lying placenta NOS or without hemorrhage, second trimester: Secondary | ICD-10-CM

## 2017-04-16 DIAGNOSIS — O99210 Obesity complicating pregnancy, unspecified trimester: Secondary | ICD-10-CM

## 2017-04-16 DIAGNOSIS — Z3A24 24 weeks gestation of pregnancy: Secondary | ICD-10-CM

## 2017-04-16 DIAGNOSIS — Z3490 Encounter for supervision of normal pregnancy, unspecified, unspecified trimester: Secondary | ICD-10-CM

## 2017-04-16 DIAGNOSIS — O219 Vomiting of pregnancy, unspecified: Secondary | ICD-10-CM

## 2017-04-16 DIAGNOSIS — Z3A28 28 weeks gestation of pregnancy: Secondary | ICD-10-CM

## 2017-04-16 DIAGNOSIS — Z113 Encounter for screening for infections with a predominantly sexual mode of transmission: Secondary | ICD-10-CM

## 2017-04-16 NOTE — Progress Notes (Signed)
Routine Prenatal Care Visit  Subjective  Deborah House is a 26 y.o. G2P0010 at 5477w5d being seen today for ongoing prenatal care.  She is currently monitored for the following issues for this low-risk pregnancy and has Encounter for supervision of low-risk pregnancy, antepartum; Obesity affecting pregnancy; Nausea and vomiting in pregnancy; and Low lying placenta nos or without hemorrhage, second trimester on her problem list.  ----------------------------------------------------------------------------------- Patient reports nausea and vomiting. She has been intermittently taking Diclegis and the nausea and vomiting returns when she misses a dose. Contractions: Not present. Vag. Bleeding: None.  Movement: Present. Denies leaking of fluid.  ----------------------------------------------------------------------------------- The following portions of the patient's history were reviewed and updated as appropriate: allergies, current medications, past family history, past medical history, past social history, past surgical history and problem list. Problem list updated.   Objective  Blood pressure 130/74, weight 218 lb (98.9 kg), last menstrual period 09/20/2016. Pregravid weight 210 lb (95.3 kg) Total Weight Gain 8 lb (3.629 kg) Urinalysis: Urine Protein: Trace Urine Glucose: Negative  Fetal Status: Fetal Heart Rate (bpm): 151   Movement: Present  Presentation: Vertex  General:  Alert, oriented and cooperative. Patient is in no acute distress.  Skin: Skin is warm and dry. No rash noted.   Cardiovascular: Normal heart rate noted  Respiratory: Normal respiratory effort, no problems with respiration noted  Abdomen: Soft, gravid, appropriate for gestational age. Pain/Pressure: Absent     Pelvic:  Cervical exam deferred        Extremities: Normal range of motion.  Edema: Trace  Mental Status: Normal mood and affect. Normal behavior. Normal judgment and thought content.      Assessment   26 y.o. G2P0010 at 4477w5d, EDD 07/04/2017 by Ultrasound presenting for routine prenatal visit.  Plan   pregnancy2 Problems (from 09/20/16 to present)    Problem Noted Resolved   Low lying placenta nos or without hemorrhage, second trimester 02/19/2017 by Vena AustriaStaebler, Andreas, MD No   Overview Signed 03/19/2017 10:27 AM by Vena AustriaStaebler, Andreas, MD    [ ]  Follow up 28 weeks      Encounter for supervision of low-risk pregnancy, antepartum 11/07/2016 by Nadara MustardHarris, Robert P, MD No   Overview Addendum 03/19/2017 10:51 AM by Vena AustriaStaebler, Andreas, MD    [facility] Prenatal Labs  Dating 8 week US Blood type: O/Negative/-- (06/20 1416)   Genetic Screen  1st tri neg Antibody:Negative (06/20 1416)  Anatomic US 02/19/17 XX Complete on follow up 03/19/17 but low lying placenta Rubella: 1.68 (06/20 1416) Varicella: Immune  GTT Early:               Third trimester:  RPR: Non Reactive (06/20 1416)   Rhogam [ ]  28 weeks HBsAg: Negative (06/20 1416)   TDaP vaccine                       Flu Shot: declined HIV: Negative  Baby Food                                GBS:   Contraception  Pap: 07/20/15 NIL  CBB     CS/VBAC    Support Person               Obesity affecting pregnancy 11/07/2016 by Nadara MustardHarris, Robert P, MD No   Overview Signed 02/19/2017  9:59 AM by Vena AustriaStaebler, Andreas, MD    Body mass index is 33.89  kg/m.        Unable to complete GTT and 28 week labs today because she vomited after drinking glucola. Will try again next visit. Also, need to give RhoGam next visit after antibody screen is drawn.  Placenta is still low-lying at 1.1 cm from internal os, follow-up ultrasound at 32-34 weeks.    Preterm labor symptoms and general obstetric precautions including but not limited to vaginal bleeding, contractions, leaking of fluid and fetal movement were reviewed in detail with the patient.  Return in about 2 weeks (around 04/30/2017) for ROB with glucola/labs.  Marcelyn BruinsJacelyn Idalia Allbritton,  CNM 04/16/2017  11:51 AM

## 2017-04-30 ENCOUNTER — Encounter: Payer: BLUE CROSS/BLUE SHIELD | Admitting: Obstetrics and Gynecology

## 2017-04-30 ENCOUNTER — Ambulatory Visit (INDEPENDENT_AMBULATORY_CARE_PROVIDER_SITE_OTHER): Payer: BLUE CROSS/BLUE SHIELD | Admitting: Advanced Practice Midwife

## 2017-04-30 ENCOUNTER — Encounter: Payer: Self-pay | Admitting: Advanced Practice Midwife

## 2017-04-30 ENCOUNTER — Other Ambulatory Visit: Payer: BLUE CROSS/BLUE SHIELD

## 2017-04-30 VITALS — BP 110/60 | Wt 222.0 lb

## 2017-04-30 DIAGNOSIS — Z6791 Unspecified blood type, Rh negative: Secondary | ICD-10-CM

## 2017-04-30 DIAGNOSIS — Z23 Encounter for immunization: Secondary | ICD-10-CM

## 2017-04-30 DIAGNOSIS — Z113 Encounter for screening for infections with a predominantly sexual mode of transmission: Secondary | ICD-10-CM | POA: Diagnosis not present

## 2017-04-30 DIAGNOSIS — O09893 Supervision of other high risk pregnancies, third trimester: Secondary | ICD-10-CM

## 2017-04-30 DIAGNOSIS — O26893 Other specified pregnancy related conditions, third trimester: Principal | ICD-10-CM

## 2017-04-30 DIAGNOSIS — Z3A24 24 weeks gestation of pregnancy: Secondary | ICD-10-CM | POA: Diagnosis not present

## 2017-04-30 DIAGNOSIS — Z3A30 30 weeks gestation of pregnancy: Secondary | ICD-10-CM

## 2017-04-30 DIAGNOSIS — O99210 Obesity complicating pregnancy, unspecified trimester: Secondary | ICD-10-CM | POA: Diagnosis not present

## 2017-04-30 DIAGNOSIS — Z3490 Encounter for supervision of normal pregnancy, unspecified, unspecified trimester: Secondary | ICD-10-CM | POA: Diagnosis not present

## 2017-04-30 DIAGNOSIS — O4440 Low lying placenta NOS or without hemorrhage, unspecified trimester: Secondary | ICD-10-CM

## 2017-04-30 MED ORDER — TETANUS-DIPHTH-ACELL PERTUSSIS 5-2.5-18.5 LF-MCG/0.5 IM SUSP
0.5000 mL | Freq: Once | INTRAMUSCULAR | Status: AC
  Administered 2017-04-30: 0.5 mL via INTRAMUSCULAR

## 2017-04-30 MED ORDER — RHO D IMMUNE GLOBULIN 1500 UNIT/2ML IJ SOSY
300.0000 ug | PREFILLED_SYRINGE | Freq: Once | INTRAMUSCULAR | Status: AC
  Administered 2017-04-30: 300 ug via INTRAMUSCULAR

## 2017-04-30 NOTE — Patient Instructions (Signed)
Third Trimester of Pregnancy The third trimester is from week 28 through week 40 (months 7 through 9). The third trimester is a time when the unborn baby (fetus) is growing rapidly. At the end of the ninth month, the fetus is about 20 inches in length and weighs 6-10 pounds. Body changes during your third trimester Your body will continue to go through many changes during pregnancy. The changes vary from woman to woman. During the third trimester:  Your weight will continue to increase. You can expect to gain 25-35 pounds (11-16 kg) by the end of the pregnancy.  You may begin to get stretch marks on your hips, abdomen, and breasts.  You may urinate more often because the fetus is moving lower into your pelvis and pressing on your bladder.  You may develop or continue to have heartburn. This is caused by increased hormones that slow down muscles in the digestive tract.  You may develop or continue to have constipation because increased hormones slow digestion and cause the muscles that push waste through your intestines to relax.  You may develop hemorrhoids. These are swollen veins (varicose veins) in the rectum that can itch or be painful.  You may develop swollen, bulging veins (varicose veins) in your legs.  You may have increased body aches in the pelvis, back, or thighs. This is due to weight gain and increased hormones that are relaxing your joints.  You may have changes in your hair. These can include thickening of your hair, rapid growth, and changes in texture. Some women also have hair loss during or after pregnancy, or hair that feels dry or thin. Your hair will most likely return to normal after your baby is born.  Your breasts will continue to grow and they will continue to become tender. A yellow fluid (colostrum) may leak from your breasts. This is the first milk you are producing for your baby.  Your belly button may stick out.  You may notice more swelling in your hands,  face, or ankles.  You may have increased tingling or numbness in your hands, arms, and legs. The skin on your belly may also feel numb.  You may feel short of breath because of your expanding uterus.  You may have more problems sleeping. This can be caused by the size of your belly, increased need to urinate, and an increase in your body's metabolism.  You may notice the fetus "dropping," or moving lower in your abdomen (lightening).  You may have increased vaginal discharge.  You may notice your joints feel loose and you may have pain around your pelvic bone.  What to expect at prenatal visits You will have prenatal exams every 2 weeks until week 36. Then you will have weekly prenatal exams. During a routine prenatal visit:  You will be weighed to make sure you and the baby are growing normally.  Your blood pressure will be taken.  Your abdomen will be measured to track your baby's growth.  The fetal heartbeat will be listened to.  Any test results from the previous visit will be discussed.  You may have a cervical check near your due date to see if your cervix has softened or thinned (effaced).  You will be tested for Group B streptococcus. This happens between 35 and 37 weeks.  Your health care provider may ask you:  What your birth plan is.  How you are feeling.  If you are feeling the baby move.  If you have had   any abnormal symptoms, such as leaking fluid, bleeding, severe headaches, or abdominal cramping.  If you are using any tobacco products, including cigarettes, chewing tobacco, and electronic cigarettes.  If you have any questions.  Other tests or screenings that may be performed during your third trimester include:  Blood tests that check for low iron levels (anemia).  Fetal testing to check the health, activity level, and growth of the fetus. Testing is done if you have certain medical conditions or if there are problems during the  pregnancy.  Nonstress test (NST). This test checks the health of your baby to make sure there are no signs of problems, such as the baby not getting enough oxygen. During this test, a belt is placed around your belly. The baby is made to move, and its heart rate is monitored during movement.  What is false labor? False labor is a condition in which you feel small, irregular tightenings of the muscles in the womb (contractions) that usually go away with rest, changing position, or drinking water. These are called Braxton Hicks contractions. Contractions may last for hours, days, or even weeks before true labor sets in. If contractions come at regular intervals, become more frequent, increase in intensity, or become painful, you should see your health care provider. What are the signs of labor?  Abdominal cramps.  Regular contractions that start at 10 minutes apart and become stronger and more frequent with time.  Contractions that start on the top of the uterus and spread down to the lower abdomen and back.  Increased pelvic pressure and dull back pain.  A watery or bloody mucus discharge that comes from the vagina.  Leaking of amniotic fluid. This is also known as your "water breaking." It could be a slow trickle or a gush. Let your health care provider know if it has a color or strange odor. If you have any of these signs, call your health care provider right away, even if it is before your due date. Follow these instructions at home: Medicines  Follow your health care provider's instructions regarding medicine use. Specific medicines may be either safe or unsafe to take during pregnancy.  Take a prenatal vitamin that contains at least 600 micrograms (mcg) of folic acid.  If you develop constipation, try taking a stool softener if your health care provider approves. Eating and drinking  Eat a balanced diet that includes fresh fruits and vegetables, whole grains, good sources of protein  such as meat, eggs, or tofu, and low-fat dairy. Your health care provider will help you determine the amount of weight gain that is right for you.  Avoid raw meat and uncooked cheese. These carry germs that can cause birth defects in the baby.  If you have low calcium intake from food, talk to your health care provider about whether you should take a daily calcium supplement.  Eat four or five small meals rather than three large meals a day.  Limit foods that are high in fat and processed sugars, such as fried and sweet foods.  To prevent constipation: ? Drink enough fluid to keep your urine clear or pale yellow. ? Eat foods that are high in fiber, such as fresh fruits and vegetables, whole grains, and beans. Activity  Exercise only as directed by your health care provider. Most women can continue their usual exercise routine during pregnancy. Try to exercise for 30 minutes at least 5 days a week. Stop exercising if you experience uterine contractions.  Avoid heavy   lifting.  Do not exercise in extreme heat or humidity, or at high altitudes.  Wear low-heel, comfortable shoes.  Practice good posture.  You may continue to have sex unless your health care provider tells you otherwise. Relieving pain and discomfort  Take frequent breaks and rest with your legs elevated if you have leg cramps or low back pain.  Take warm sitz baths to soothe any pain or discomfort caused by hemorrhoids. Use hemorrhoid cream if your health care provider approves.  Wear a good support bra to prevent discomfort from breast tenderness.  If you develop varicose veins: ? Wear support pantyhose or compression stockings as told by your healthcare provider. ? Elevate your feet for 15 minutes, 3-4 times a day. Prenatal care  Write down your questions. Take them to your prenatal visits.  Keep all your prenatal visits as told by your health care provider. This is important. Safety  Wear your seat belt at  all times when driving.  Make a list of emergency phone numbers, including numbers for family, friends, the hospital, and police and fire departments. General instructions  Avoid cat litter boxes and soil used by cats. These carry germs that can cause birth defects in the baby. If you have a cat, ask someone to clean the litter box for you.  Do not travel far distances unless it is absolutely necessary and only with the approval of your health care provider.  Do not use hot tubs, steam rooms, or saunas.  Do not drink alcohol.  Do not use any products that contain nicotine or tobacco, such as cigarettes and e-cigarettes. If you need help quitting, ask your health care provider.  Do not use any medicinal herbs or unprescribed drugs. These chemicals affect the formation and growth of the baby.  Do not douche or use tampons or scented sanitary pads.  Do not cross your legs for long periods of time.  To prepare for the arrival of your baby: ? Take prenatal classes to understand, practice, and ask questions about labor and delivery. ? Make a trial run to the hospital. ? Visit the hospital and tour the maternity area. ? Arrange for maternity or paternity leave through employers. ? Arrange for family and friends to take care of pets while you are in the hospital. ? Purchase a rear-facing car seat and make sure you know how to install it in your car. ? Pack your hospital bag. ? Prepare the baby's nursery. Make sure to remove all pillows and stuffed animals from the baby's crib to prevent suffocation.  Visit your dentist if you have not gone during your pregnancy. Use a soft toothbrush to brush your teeth and be gentle when you floss. Contact a health care provider if:  You are unsure if you are in labor or if your water has broken.  You become dizzy.  You have mild pelvic cramps, pelvic pressure, or nagging pain in your abdominal area.  You have lower back pain.  You have persistent  nausea, vomiting, or diarrhea.  You have an unusual or bad smelling vaginal discharge.  You have pain when you urinate. Get help right away if:  Your water breaks before 37 weeks.  You have regular contractions less than 5 minutes apart before 37 weeks.  You have a fever.  You are leaking fluid from your vagina.  You have spotting or bleeding from your vagina.  You have severe abdominal pain or cramping.  You have rapid weight loss or weight gain.    You have shortness of breath with chest pain.  You notice sudden or extreme swelling of your face, hands, ankles, feet, or legs.  Your baby makes fewer than 10 movements in 2 hours.  You have severe headaches that do not go away when you take medicine.  You have vision changes. Summary  The third trimester is from week 28 through week 40, months 7 through 9. The third trimester is a time when the unborn baby (fetus) is growing rapidly.  During the third trimester, your discomfort may increase as you and your baby continue to gain weight. You may have abdominal, leg, and back pain, sleeping problems, and an increased need to urinate.  During the third trimester your breasts will keep growing and they will continue to become tender. A yellow fluid (colostrum) may leak from your breasts. This is the first milk you are producing for your baby.  False labor is a condition in which you feel small, irregular tightenings of the muscles in the womb (contractions) that eventually go away. These are called Braxton Hicks contractions. Contractions may last for hours, days, or even weeks before true labor sets in.  Signs of labor can include: abdominal cramps; regular contractions that start at 10 minutes apart and become stronger and more frequent with time; watery or bloody mucus discharge that comes from the vagina; increased pelvic pressure and dull back pain; and leaking of amniotic fluid. This information is not intended to replace advice  given to you by your health care provider. Make sure you discuss any questions you have with your health care provider. Document Released: 05/01/2001 Document Revised: 10/13/2015 Document Reviewed: 07/08/2012 Elsevier Interactive Patient Education  2017 [employer]  

## 2017-04-30 NOTE — Progress Notes (Signed)
C/o ligament stretching started again. rj

## 2017-04-30 NOTE — Progress Notes (Signed)
Routine Prenatal Care Visit  Subjective  Deborah House is a 26 y.o. G2P0010 at 4940w5d being seen today for ongoing prenatal care.  She is currently monitored for the following issues for this low-risk pregnancy and has Encounter for supervision of low-risk pregnancy, antepartum; Obesity affecting pregnancy; Nausea and vomiting in pregnancy; and Low lying placenta nos or without hemorrhage, second trimester on their problem list.  ----------------------------------------------------------------------------------- Patient reports round ligament pain. Leg cramping. Contractions: Not present.  .  Movement: Present. Denies leaking of fluid.  ----------------------------------------------------------------------------------- The following portions of the patient's history were reviewed and updated as appropriate: allergies, current medications, past family history, past medical history, past social history, past surgical history and problem list. Problem list updated.   Objective  Blood pressure 110/60, weight 222 lb (100.7 kg), last menstrual period 09/20/2016. Pregravid weight 210 lb (95.3 kg) Total Weight Gain 12 lb (5.443 kg) Urinalysis:      Fetal Status: Fetal Heart Rate (bpm): 145 Fundal Height: 32 cm Movement: Present     General:  Alert, oriented and cooperative. Patient is in no acute distress.  Skin: Skin is warm and dry. No rash noted.   Cardiovascular: Normal heart rate noted  Respiratory: Normal respiratory effort, no problems with respiration noted  Abdomen: Soft, gravid, appropriate for gestational age. Pain/Pressure: Absent     Pelvic:  Cervical exam deferred        Extremities: Normal range of motion.     Mental Status: Normal mood and affect. Normal behavior. Normal judgment and thought content.   Assessment   26 y.o. G2P0010 at 140w5d by  07/04/2017, by Ultrasound presenting for routine prenatal visit  Plan   pregnancy2 Problems (from 09/20/16 to present)    Problem Noted Resolved   Low lying placenta nos or without hemorrhage, second trimester 02/19/2017 by Vena AustriaStaebler, Andreas, MD No   Overview Signed 03/19/2017 10:27 AM by Vena AustriaStaebler, Andreas, MD    [ ]  Follow up 28 weeks      Encounter for supervision of low-risk pregnancy, antepartum 11/07/2016 by Nadara MustardHarris, Robert P, MD No   Overview Addendum 03/19/2017 10:51 AM by Vena AustriaStaebler, Andreas, MD    [facility] Prenatal Labs  Dating 8 week US Blood type: O/Negative/-- (06/20 1416)   Genetic Screen  1st tri neg Antibody:Negative (06/20 1416)  Anatomic US 02/19/17 XX Complete on follow up 03/19/17 but low lying placenta Rubella: 1.68 (06/20 1416) Varicella: Immune  GTT Early:               Third trimester:  RPR: Non Reactive (06/20 1416)   Rhogam [x]  30 weeks HBsAg: Negative (06/20 1416)   TDaP vaccine  12/11   Flu Shot: 12/11 HIV: Negative  Baby Food                                GBS:   Contraception  Pap: 07/20/15 NIL  CBB     CS/VBAC  Rhogam at 30 wk visit 12/11  Support Person               Obesity affecting pregnancy 11/07/2016 by Nadara MustardHarris, Robert P, MD No   Overview Signed 02/19/2017  9:59 AM by Vena AustriaStaebler, Andreas, MD    Body mass index is 33.89 kg/m.          Glucola/28 week labs and Rhogam today Preterm labor symptoms and general obstetric precautions including but not limited to vaginal bleeding, contractions, leaking of fluid  and fetal movement were reviewed in detail with the patient. Please refer to After Visit Summary for other counseling recommendations.   Increase hydration, soak in tub with epsom salts for leg cramps  Return in about 2 weeks (around 05/14/2017) for placentation u/s and rob.  Tresea MallJane Duwayne Matters, CNM  04/30/2017 11:31 AM

## 2017-04-30 NOTE — Progress Notes (Signed)
C/o ligament stretching started again. rj 

## 2017-05-01 LAB — 28 WEEKS RH-PANEL
Antibody Screen: NEGATIVE
BASOS: 0 %
Basophils Absolute: 0 10*3/uL (ref 0.0–0.2)
EOS (ABSOLUTE): 0.1 10*3/uL (ref 0.0–0.4)
Eos: 1 %
Gestational Diabetes Screen: 89 mg/dL (ref 65–139)
HEMATOCRIT: 25.5 % — AB (ref 34.0–46.6)
HIV Screen 4th Generation wRfx: NONREACTIVE
Hemoglobin: 8 g/dL — ABNORMAL LOW (ref 11.1–15.9)
IMMATURE GRANULOCYTES: 0 %
Immature Grans (Abs): 0 10*3/uL (ref 0.0–0.1)
LYMPHS ABS: 2.3 10*3/uL (ref 0.7–3.1)
Lymphs: 26 %
MCH: 23 pg — AB (ref 26.6–33.0)
MCHC: 31.4 g/dL — AB (ref 31.5–35.7)
MCV: 73 fL — AB (ref 79–97)
Monocytes Absolute: 0.6 10*3/uL (ref 0.1–0.9)
Monocytes: 7 %
NEUTROS PCT: 66 %
Neutrophils Absolute: 5.9 10*3/uL (ref 1.4–7.0)
PLATELETS: 353 10*3/uL (ref 150–379)
RBC: 3.48 x10E6/uL — ABNORMAL LOW (ref 3.77–5.28)
RDW: 15.6 % — ABNORMAL HIGH (ref 12.3–15.4)
RPR Ser Ql: NONREACTIVE
WBC: 9 10*3/uL (ref 3.4–10.8)

## 2017-05-15 ENCOUNTER — Encounter: Payer: Self-pay | Admitting: Obstetrics and Gynecology

## 2017-05-15 ENCOUNTER — Other Ambulatory Visit: Payer: Self-pay | Admitting: Obstetrics and Gynecology

## 2017-05-15 DIAGNOSIS — O99019 Anemia complicating pregnancy, unspecified trimester: Secondary | ICD-10-CM | POA: Insufficient documentation

## 2017-05-15 DIAGNOSIS — O99013 Anemia complicating pregnancy, third trimester: Secondary | ICD-10-CM

## 2017-05-15 HISTORY — DX: Anemia complicating pregnancy, unspecified trimester: O99.019

## 2017-05-15 NOTE — Progress Notes (Signed)
Anemia on 28 week labs referral to hematology

## 2017-05-17 ENCOUNTER — Other Ambulatory Visit: Payer: Self-pay | Admitting: Advanced Practice Midwife

## 2017-05-17 ENCOUNTER — Ambulatory Visit (INDEPENDENT_AMBULATORY_CARE_PROVIDER_SITE_OTHER): Payer: BLUE CROSS/BLUE SHIELD

## 2017-05-17 ENCOUNTER — Ambulatory Visit (INDEPENDENT_AMBULATORY_CARE_PROVIDER_SITE_OTHER): Payer: BLUE CROSS/BLUE SHIELD | Admitting: Obstetrics and Gynecology

## 2017-05-17 VITALS — BP 112/70 | Wt 224.0 lb

## 2017-05-17 DIAGNOSIS — O4440 Low lying placenta NOS or without hemorrhage, unspecified trimester: Secondary | ICD-10-CM

## 2017-05-17 DIAGNOSIS — O219 Vomiting of pregnancy, unspecified: Secondary | ICD-10-CM

## 2017-05-17 DIAGNOSIS — O99210 Obesity complicating pregnancy, unspecified trimester: Secondary | ICD-10-CM

## 2017-05-17 DIAGNOSIS — Z3A33 33 weeks gestation of pregnancy: Secondary | ICD-10-CM

## 2017-05-17 DIAGNOSIS — Z3490 Encounter for supervision of normal pregnancy, unspecified, unspecified trimester: Secondary | ICD-10-CM

## 2017-05-17 DIAGNOSIS — O4442 Low lying placenta NOS or without hemorrhage, second trimester: Secondary | ICD-10-CM

## 2017-05-17 MED ORDER — DOXYLAMINE-PYRIDOXINE 10-10 MG PO TBEC: 2.0000 | DELAYED_RELEASE_TABLET | Freq: Every day | ORAL | 2 refills | Status: DC

## 2017-05-17 NOTE — Progress Notes (Signed)
ROB

## 2017-05-19 NOTE — Progress Notes (Signed)
Routine Prenatal Care Visit  Subjective  Deborah House is a 26 y.o. G2P0010 at 1221w3d being seen today for ongoing prenatal care.  She is currently monitored for the following issues for this high-risk pregnancy and has Encounter for supervision of low-risk pregnancy, antepartum; Obesity affecting pregnancy; Nausea and vomiting in pregnancy; Low lying placenta nos or without hemorrhage, second trimester; and Anemia affecting pregnancy on their problem list.  ----------------------------------------------------------------------------------- Patient reports no complaints.   Contractions: Not present. Vag. Bleeding: None.  Movement: Present. Denies leaking of fluid.  ----------------------------------------------------------------------------------- The following portions of the patient's history were reviewed and updated as appropriate: allergies, current medications, past family history, past medical history, past social history, past surgical history and problem list. Problem list updated.   Objective  Blood pressure 112/70, weight 224 lb (101.6 kg), last menstrual period 09/20/2016. Pregravid weight 210 lb (95.3 kg) Total Weight Gain 14 lb (6.35 kg)  Body mass index is 36.15 kg/m.  Urinalysis: Urine Protein: Negative Urine Glucose: Negative  Fetal Status: Fetal Heart Rate (bpm): 140 Fundal Height: 34 cm Movement: Present  Presentation: Vertex  General:  Alert, oriented and cooperative. Patient is in no acute distress.  Skin: Skin is warm and dry. No rash noted.   Cardiovascular: Normal heart rate noted  Respiratory: Normal respiratory effort, no problems with respiration noted  Abdomen: Soft, gravid, appropriate for gestational age. Pain/Pressure: Absent     Pelvic:  Cervical exam deferred        Extremities: Normal range of motion.     ental Status: Normal mood and affect. Normal behavior. Normal judgment and thought content.     Assessment   26 y.o. G2P0010 at 9821w3d  by  07/04/2017, by Ultrasound presenting for routine prenatal visit  Plan   pregnancy2 Problems (from 09/20/16 to present)    Problem Noted Resolved   Anemia affecting pregnancy 05/15/2017 by Vena AustriaStaebler, Alejandrina Raimer, MD No   Overview Signed 05/15/2017  1:16 PM by Vena AustriaStaebler, Francesa Eugenio, MD    Hgb 8.0 at 28 weeks [X]  hematology referral      Low lying placenta nos or without hemorrhage, second trimester 02/19/2017 by Vena AustriaStaebler, Austina Constantin, MD No   Overview Signed 03/19/2017 10:27 AM by Vena AustriaStaebler, Ilija Maxim, MD    [ ]  Follow up 28 weeks      Encounter for supervision of low-risk pregnancy, antepartum 11/07/2016 by Nadara MustardHarris, Robert P, MD No   Overview Addendum 03/19/2017 10:51 AM by Vena AustriaStaebler, Jakeira Seeman, MD    Clinic [facility] Prenatal Labs  Dating 8 week US Blood type: O/Negative/-- (06/20 1416)   Genetic Screen  1st tri neg Antibody:Negative (06/20 1416)  Anatomic US 02/19/17 XX Complete on follow up 03/19/17 but low lying placenta Rubella: 1.68 (06/20 1416) Varicella: Immune  GTT Early:               Third trimester:  RPR: Non Reactive (06/20 1416)   Rhogam [ ]  28 weeks HBsAg: Negative (06/20 1416)   TDaP vaccine                       Flu Shot: declined HIV: Negative  Baby Food                                GBS:   Contraception  Pap: 07/20/15 NIL  CBB     CS/VBAC    Support Person  Obesity affecting pregnancy 11/07/2016 by Nadara MustardHarris, Robert P, MD No   Overview Signed 02/19/2017  9:59 AM by Vena AustriaStaebler, Sandara Tyree, MD    Body mass index is 33.89 kg/m.           Preterm labor symptoms and general obstetric precautions including but not limited to vaginal bleeding, contractions, leaking of fluid and fetal movement were reviewed in detail with the patient. Please refer to After Visit Summary for other counseling recommendations.  - placenta remains low lying at 1.1cm,  re-image at 36 weeks but should fail to move C-section scheduled for 39 weeks  Return in about 2 weeks (around 05/31/2017) for  rob.

## 2017-05-20 ENCOUNTER — Ambulatory Visit: Payer: 59 | Admitting: Oncology

## 2017-05-20 ENCOUNTER — Telehealth: Payer: Self-pay | Admitting: Obstetrics and Gynecology

## 2017-05-20 NOTE — Telephone Encounter (Signed)
Lmtrc

## 2017-05-20 NOTE — Telephone Encounter (Signed)
-----   Message from Vena AustriaAndreas Staebler, MD sent at 05/17/2017 10:46 AM EST ----- Regarding: C-section Surgery Date: 06/27/16  LOS: inpatient  Surgery Booking Request Patient Full Name: Deborah House Duke MRN: [id number]  DOB: [birth year]  Surgeon: Vena AustriaAndreas Staebler, MD  Requested Surgery Date and Time: 06/27/16 Primary Diagnosis and Code: Low lying placenta Secondary Diagnosis and Code:  Surgical Procedure: Cesarean Section L&D Notification:yes Admission Status: surgery admit Length of Surgery: 1hr Special Case Needs: none H&P:  (date) Phone Interview or Office Pre-Admit: preadmit Interpreter: non Language: none Medical Clearance: none Special Scheduling Instructions: none

## 2017-05-22 NOTE — Telephone Encounter (Signed)
Lmtrc

## 2017-05-26 NOTE — Progress Notes (Signed)
[facility]  Telephone:(336) [phone] [phone]) [phone]  ID: Ulyess MortShakima Torain Seeley OB: [birth year]  MR#: [id number]  AOZ#:[id number]#:[id number]  Patient Care Team: Patient, No Pcp Per as PCP - General (General Practice)  CHIEF COMPLAINT: Anemia affecting pregnancy in the third trimester.  INTERVAL HISTORY: Patient is a 27 year old female in the third trimester of her first pregnancy who was noted to have a declining hemoglobin and iron stores and routine blood work.  She is having a scheduled C-section on June 27, 2017.  Currently, she feels well and is asymptomatic.  She has no neurologic complaints.  She denies any recent fevers or illnesses.  She is gaining weight appropriately.  She has no chest pain or shortness of breath.  She denies any nausea, vomiting, constipation, or diarrhea.  She has no melena or hematochezia.  She has no urinary complaints.  Patient offers no specific complaints today.  REVIEW OF SYSTEMS:   Review of Systems  Constitutional: Negative.  Negative for fever, malaise/fatigue and weight loss.  Respiratory: Negative.  Negative for cough and shortness of breath.   Cardiovascular: Negative.  Negative for chest pain and leg swelling.  Gastrointestinal: Negative.  Negative for abdominal pain, blood in stool and melena.  Genitourinary: Negative.  Negative for hematuria.  Musculoskeletal: Negative.   Skin: Negative.  Negative for rash.  Neurological: Negative.  Negative for sensory change and weakness.  Psychiatric/Behavioral: Negative.  The patient is not nervous/anxious.     As per HPI. Otherwise, a complete review of systems is negative.  PAST MEDICAL HISTORY: Past Medical History:  Diagnosis Date  . Abdominal pain   . Amenorrhea   . Chlamydia 2012   tx'd  . Family history of ovarian cancer 07/2015   genetic testing letter sent  . Hemorrhoids   . Missed abortion 06/01/2014   [facility]    PAST SURGICAL HISTORY: Past Surgical History:  Procedure  Laterality Date  . DILATION AND CURETTAGE OF UTERUS  06/01/2014    FAMILY HISTORY: Family History  Problem Relation Age of Onset  . Diabetes Maternal Grandmother   . Ovarian cancer Maternal Grandmother 60  . Anemia Mother   . Anemia Sister     ADVANCED DIRECTIVES (Y/N):  N  HEALTH MAINTENANCE: Social History   Tobacco Use  . Smoking status: Never Smoker  . Smokeless tobacco: Never Used  Substance Use Topics  . Alcohol use: No  . Drug use: No     Colonoscopy:  PAP:  Bone density:  Lipid panel:  Allergies  Allergen Reactions  . Ibuprofen     Swelling     Current Outpatient Medications  Medication Sig Dispense Refill  . aluminum-magnesium hydroxide-simethicone (MAALOX) 200-200-20 MG/5ML SUSP Take 30 mLs by mouth 4 (four) times daily -  before meals and at bedtime. 150 mL 0  . Doxylamine-Pyridoxine (DICLEGIS) 10-10 MG TBEC Take 2 tablets by mouth at bedtime. If symptoms persist, add one tablet in the morning and one in the afternoon 100 tablet 2  . nystatin cream (MYCOSTATIN) Apply 1 application 2 (two) times daily topically. 30 g 1  . terconazole (TERAZOL 7) 0.4 % vaginal cream Place 1 applicator at bedtime vaginally. Use nightly for 7 nights, then use once weekly for six months. 45 g 3  . famotidine (PEPCID) 20 MG tablet Take 1 tablet (20 mg total) by mouth 2 (two) times daily. (Patient not taking: Reported on 03/19/2017) 15 tablet 0   No current facility-administered medications for this visit.     OBJECTIVE:  Vitals:   05/27/17 1039  BP: 123/73  Pulse: (!) 114  Resp: 18  Temp: 97.8 F (36.6 C)     Body mass index is 35.96 kg/m.    ECOG FS:0 - Asymptomatic  General: Well-developed, well-nourished, no acute distress. Eyes: Pink conjunctiva, anicteric sclera. HEENT: Normocephalic, moist mucous membranes, clear oropharnyx. Lungs: Clear to auscultation bilaterally. Heart: Regular rate and rhythm. No rubs, murmurs, or gallops. Abdomen: Appears appropriate for  gestational age. Musculoskeletal: No edema, cyanosis, or clubbing. Neuro: Alert, answering all questions appropriately. Cranial nerves grossly intact. Skin: No rashes or petechiae noted. Psych: Normal affect. Lymphatics: No cervical, calvicular, axillary or inguinal LAD.   LAB RESULTS:  Lab Results  Component Value Date   NA 137 12/17/2016   K 3.7 12/17/2016   CL 107 12/17/2016   CO2 22 12/17/2016   GLUCOSE 102 (H) 12/17/2016   BUN 7 12/17/2016   CREATININE 0.61 12/17/2016   CALCIUM 9.1 12/17/2016   PROT 7.3 12/17/2016   ALBUMIN 3.8 12/17/2016   AST 22 12/17/2016   ALT 11 (L) 12/17/2016   ALKPHOS 54 12/17/2016   BILITOT 0.4 12/17/2016   GFRNONAA >60 12/17/2016   GFRAA >60 12/17/2016    Lab Results  Component Value Date   WBC 10.6 05/27/2017   NEUTROABS 7.5 (H) 05/27/2017   HGB 7.9 (L) 05/27/2017   HCT 25.5 (L) 05/27/2017   MCV 68.8 (L) 05/27/2017   PLT 328 05/27/2017   No results found for: IRON, TIBC, IRONPCTSAT  No results found for: FERRITIN   STUDIES: No results found.  ASSESSMENT: Anemia affecting pregnancy in the third trimester.  PLAN:  1. Anemia affecting pregnancy in the third trimester: Patient's hemoglobin is trending down and is now below 8.0.  Iron stores are pending at time of dictation.  She reports she is not taking oral iron supplementation.  Patient will benefit from IV iron and will return later this week to receive 510 mg IV Feraheme.  She will then return next week for a second infusion.  Patient will return to clinic in mid May, approximately 3 months postpartum, for further evaluation, repeat laboratory work, and consideration of additional IV iron if necessary. 2.  Decreased MCV: Hemoglobinopathy profile was previously reported as normal. 3.  Pregnancy: Patient reports a scheduled C-section on June 27, 2017.  Approximately 45 minutes was spent in discussion of which greater than 50% was consultation.  Patient expressed understanding  and was in agreement with this plan. She also understands that She can call clinic at any time with any questions, concerns, or complaints.    Jeralyn Ruths, MD   05/27/2017 12:52 PM

## 2017-05-27 ENCOUNTER — Encounter: Payer: Self-pay | Admitting: Oncology

## 2017-05-27 ENCOUNTER — Inpatient Hospital Stay: Payer: BLUE CROSS/BLUE SHIELD

## 2017-05-27 ENCOUNTER — Inpatient Hospital Stay: Payer: BLUE CROSS/BLUE SHIELD | Attending: Oncology | Admitting: Oncology

## 2017-05-27 VITALS — BP 123/73 | HR 114 | Temp 97.8°F | Resp 18 | Wt 222.8 lb

## 2017-05-27 DIAGNOSIS — Z8041 Family history of malignant neoplasm of ovary: Secondary | ICD-10-CM | POA: Diagnosis not present

## 2017-05-27 DIAGNOSIS — O99013 Anemia complicating pregnancy, third trimester: Secondary | ICD-10-CM

## 2017-05-27 DIAGNOSIS — D649 Anemia, unspecified: Secondary | ICD-10-CM | POA: Insufficient documentation

## 2017-05-27 DIAGNOSIS — Z832 Family history of diseases of the blood and blood-forming organs and certain disorders involving the immune mechanism: Secondary | ICD-10-CM

## 2017-05-27 LAB — CBC WITH DIFFERENTIAL/PLATELET
BASOS PCT: 0 %
Basophils Absolute: 0 10*3/uL (ref 0–0.1)
EOS ABS: 0 10*3/uL (ref 0–0.7)
Eosinophils Relative: 0 %
HEMATOCRIT: 25.5 % — AB (ref 35.0–47.0)
Hemoglobin: 7.9 g/dL — ABNORMAL LOW (ref 12.0–16.0)
Lymphocytes Relative: 21 %
Lymphs Abs: 2.2 10*3/uL (ref 1.0–3.6)
MCH: 21.4 pg — ABNORMAL LOW (ref 26.0–34.0)
MCHC: 31.1 g/dL — AB (ref 32.0–36.0)
MCV: 68.8 fL — ABNORMAL LOW (ref 80.0–100.0)
MONO ABS: 0.8 10*3/uL (ref 0.2–0.9)
MONOS PCT: 7 %
NEUTROS ABS: 7.5 10*3/uL — AB (ref 1.4–6.5)
Neutrophils Relative %: 72 %
Platelets: 328 10*3/uL (ref 150–440)
RBC: 3.7 MIL/uL — ABNORMAL LOW (ref 3.80–5.20)
RDW: 16.3 % — AB (ref 11.5–14.5)
WBC: 10.6 10*3/uL (ref 3.6–11.0)

## 2017-05-27 LAB — FERRITIN: Ferritin: 6 ng/mL — ABNORMAL LOW (ref 11–307)

## 2017-05-27 LAB — IRON AND TIBC
IRON: 24 ug/dL — AB (ref 28–170)
SATURATION RATIOS: 4 % — AB (ref 10.4–31.8)
TIBC: 617 ug/dL — AB (ref 250–450)
UIBC: 593 ug/dL

## 2017-05-27 LAB — VITAMIN B12: Vitamin B-12: 180 pg/mL (ref 180–914)

## 2017-05-27 LAB — FOLATE: FOLATE: 11.9 ng/mL (ref >5.9)

## 2017-05-27 NOTE — Telephone Encounter (Signed)
Lmtrc

## 2017-05-28 ENCOUNTER — Inpatient Hospital Stay: Payer: BLUE CROSS/BLUE SHIELD

## 2017-05-28 VITALS — BP 110/74 | HR 105 | Temp 97.8°F | Resp 20

## 2017-05-28 DIAGNOSIS — O99013 Anemia complicating pregnancy, third trimester: Secondary | ICD-10-CM | POA: Diagnosis not present

## 2017-05-28 DIAGNOSIS — D649 Anemia, unspecified: Secondary | ICD-10-CM | POA: Diagnosis not present

## 2017-05-28 MED ORDER — SODIUM CHLORIDE 0.9 % IV SOLN
510.0000 mg | Freq: Once | INTRAVENOUS | Status: AC
  Administered 2017-05-28: 510 mg via INTRAVENOUS
  Filled 2017-05-28: qty 17

## 2017-05-28 MED ORDER — SODIUM CHLORIDE 0.9 % IV SOLN
Freq: Once | INTRAVENOUS | Status: AC
  Administered 2017-05-28: 14:00:00 via INTRAVENOUS
  Filled 2017-05-28: qty 1000

## 2017-05-31 ENCOUNTER — Ambulatory Visit (INDEPENDENT_AMBULATORY_CARE_PROVIDER_SITE_OTHER): Payer: BLUE CROSS/BLUE SHIELD | Admitting: Obstetrics and Gynecology

## 2017-05-31 DIAGNOSIS — O4442 Low lying placenta NOS or without hemorrhage, second trimester: Secondary | ICD-10-CM

## 2017-05-31 NOTE — Progress Notes (Signed)
ROB Heartburn/gas

## 2017-05-31 NOTE — Progress Notes (Signed)
Routine Prenatal Care Visit  Subjective  Deborah House is a 27 y.o. G2P0010 at [redacted]w[redacted]d being seen today for ongoing prenatal care.  She is currently monitored for the following issues for this low-risk pregnancy and has Encounter for supervision of low-risk pregnancy, antepartum; Obesity affecting pregnancy; Nausea and vomiting in pregnancy; Low lying placenta nos or without hemorrhage, second trimester; and Anemia affecting pregnancy on their problem list.  ----------------------------------------------------------------------------------- Patient reports no complaints.   Contractions: Not present. Vag. Bleeding: None.  Movement: Present. Denies leaking of fluid.  ----------------------------------------------------------------------------------- The following portions of the patient's history were reviewed and updated as appropriate: allergies, current medications, past family history, past medical history, past social history, past surgical history and problem list. Problem list updated.   Objective  Blood pressure 118/62, weight 224 lb (101.6 kg), last menstrual period 09/20/2016. Pregravid weight 210 lb (95.3 kg) Total Weight Gain 14 lb (6.35 kg) Urinalysis:      Fetal Status: Fetal Heart Rate (bpm): 145 Fundal Height: 35 cm Movement: Present  Presentation: Vertex  General:  Alert, oriented and cooperative. Patient is in no acute distress.  Skin: Skin is warm and dry. No rash noted.   Cardiovascular: Normal heart rate noted  Respiratory: Normal respiratory effort, no problems with respiration noted  Abdomen: Soft, gravid, appropriate for gestational age. Pain/Pressure: Absent     Pelvic:  Cervical exam deferred        Extremities: Normal range of motion.     ental Status: Normal mood and affect. Normal behavior. Normal judgment and thought content.     Assessment   28 y.o. G2P0010 at [redacted]w[redacted]d by  07/04/2017, by Ultrasound presenting for routine prenatal visit  Plan    pregnancy2 Problems (from 09/20/16 to present)    Problem Noted Resolved   Anemia affecting pregnancy 05/15/2017 by [name], MD No   Overview Signed 05/15/2017  1:16 PM by [name], MD    Hgb 8.0 at 28 weeks [X]  hematology referral      Low lying placenta nos or without hemorrhage, second trimester 02/19/2017 by [name], MD No   Overview Signed 03/19/2017 10:27 AM by [name], MD    [ ]  Follow up 28 weeks      Encounter for supervision of low-risk pregnancy, antepartum 11/07/2016 by Nadara Mustard, MD No   Overview Addendum 03/19/2017 10:51 AM by [name], MD    Clinic [facility] Prenatal Labs  Dating 8 week [place] Blood type: O/Negative/-- (06/20 1416)   Genetic Screen  1st tri neg Antibody:Negative (06/20 1416)  Anatomic [place] 02/19/17 XX Complete on follow up 03/19/17 but low lying placenta Rubella: 1.68 (06/20 1416) Varicella: Immune  GTT Early:               Third trimester:  RPR: Non Reactive (06/20 1416)   Rhogam [ ]  28 weeks HBsAg: Negative (06/20 1416)   TDaP vaccine                       Flu Shot: declined HIV: Negative  Baby Food                                GBS:   Contraception  Pap: 07/20/15 NIL  CBB     CS/VBAC    Support Person               Obesity affecting pregnancy 11/07/2016 by  Nadara MustardHarris, Robert P, MD No   Overview Signed 02/19/2017  9:59 AM by Vena AustriaStaebler, Brysen Shankman, MD    Body mass index is 33.89 kg/m.           Term labor symptoms and general obstetric precautions including but not limited to vaginal bleeding, contractions, leaking of fluid and fetal movement were reviewed in detail with the patient. Please refer to After Visit Summary for other counseling recommendations.   Return in about 1 week (around 06/07/2017) for ROB (see nancy after checkout).

## 2017-05-31 NOTE — Telephone Encounter (Signed)
Patient scheduled while in office. [facility] is aware [facility] @ 8:50am, Pre-admit Testing afterwards, and OR on 06/27/17.

## 2017-06-05 ENCOUNTER — Inpatient Hospital Stay: Payer: BLUE CROSS/BLUE SHIELD

## 2017-06-05 VITALS — BP 117/78 | HR 99 | Temp 98.0°F | Resp 20

## 2017-06-05 DIAGNOSIS — D649 Anemia, unspecified: Secondary | ICD-10-CM | POA: Diagnosis not present

## 2017-06-05 DIAGNOSIS — O99013 Anemia complicating pregnancy, third trimester: Secondary | ICD-10-CM | POA: Diagnosis not present

## 2017-06-05 MED ORDER — SODIUM CHLORIDE 0.9 % IV SOLN
Freq: Once | INTRAVENOUS | Status: AC
  Administered 2017-06-05: 14:00:00 via INTRAVENOUS
  Filled 2017-06-05: qty 1000

## 2017-06-05 MED ORDER — SODIUM CHLORIDE 0.9 % IV SOLN
510.0000 mg | Freq: Once | INTRAVENOUS | Status: AC
  Administered 2017-06-05: 510 mg via INTRAVENOUS
  Filled 2017-06-05: qty 17

## 2017-06-06 ENCOUNTER — Ambulatory Visit (INDEPENDENT_AMBULATORY_CARE_PROVIDER_SITE_OTHER): Payer: BLUE CROSS/BLUE SHIELD | Admitting: Obstetrics & Gynecology

## 2017-06-06 VITALS — BP 110/70 | Wt 228.0 lb

## 2017-06-06 DIAGNOSIS — Z3A36 36 weeks gestation of pregnancy: Secondary | ICD-10-CM | POA: Diagnosis not present

## 2017-06-06 DIAGNOSIS — Z3490 Encounter for supervision of normal pregnancy, unspecified, unspecified trimester: Secondary | ICD-10-CM

## 2017-06-06 NOTE — Progress Notes (Signed)
PNV, FMC, PTL PRECAUTIONS, GBS DONE Annamarie MajorPaul Timesha Cervantez, MD, Merlinda FrederickFACOG [facility], [facility] 06/06/2017  10:22 AM

## 2017-06-06 NOTE — Patient Instructions (Signed)
Braxton Hicks Contractions °Contractions of the uterus can occur throughout pregnancy, but they are not always a sign that you are in labor. You may have practice contractions called Braxton Hicks contractions. These false labor contractions are sometimes confused with true labor. °What are Braxton Hicks contractions? °Braxton Hicks contractions are tightening movements that occur in the muscles of the uterus before labor. Unlike true labor contractions, these contractions do not result in opening (dilation) and thinning of the cervix. Toward the end of pregnancy (32-34 weeks), Braxton Hicks contractions can happen more often and may become stronger. These contractions are sometimes difficult to tell apart from true labor because they can be very uncomfortable. You should not feel embarrassed if you go to the hospital with false labor. °Sometimes, the only way to tell if you are in true labor is for your health care provider to look for changes in the cervix. The health care provider will do a physical exam and may monitor your contractions. If you are not in true labor, the exam should show that your cervix is not dilating and your water has not broken. °If there are other health problems associated with your pregnancy, it is completely safe for you to be sent home with false labor. You may continue to have Braxton Hicks contractions until you go into true labor. °How to tell the difference between true labor and false labor °True labor °· Contractions last 30-70 seconds. °· Contractions become very regular. °· Discomfort is usually felt in the top of the uterus, and it spreads to the lower abdomen and low back. °· Contractions do not go away with walking. °· Contractions usually become more intense and increase in frequency. °· The cervix dilates and gets thinner. °False labor °· Contractions are usually shorter and not as strong as true labor contractions. °· Contractions are usually irregular. °· Contractions  are often felt in the front of the lower abdomen and in the groin. °· Contractions may go away when you walk around or change positions while lying down. °· Contractions get weaker and are shorter-lasting as time goes on. °· The cervix usually does not dilate or become thin. °Follow these instructions at home: °· Take over-the-counter and prescription medicines only as told by your health care provider. °· Keep up with your usual exercises and follow other instructions from your health care provider. °· Eat and drink lightly if you think you are going into labor. °· If Braxton Hicks contractions are making you uncomfortable: °? Change your position from lying down or resting to walking, or change from walking to resting. °? Sit and rest in a tub of warm water. °? Drink enough fluid to keep your urine pale yellow. Dehydration may cause these contractions. °? Do slow and deep breathing several times an hour. °· Keep all follow-up prenatal visits as told by your health care provider. This is important. °Contact a health care provider if: °· You have a fever. °· You have continuous pain in your abdomen. °Get help right away if: °· Your contractions become stronger, more regular, and closer together. °· You have fluid leaking or gushing from your vagina. °· You pass blood-tinged mucus (bloody show). °· You have bleeding from your vagina. °· You have low back pain that you never had before. °· You feel your baby’s head pushing down and causing pelvic pressure. °· Your baby is not moving inside you as much as it used to. °Summary °· Contractions that occur before labor are called Braxton   Hicks contractions, false labor, or practice contractions. °· Braxton Hicks contractions are usually shorter, weaker, farther apart, and less regular than true labor contractions. True labor contractions usually become progressively stronger and regular and they become more frequent. °· Manage discomfort from Braxton Hicks contractions by  changing position, resting in a warm bath, drinking plenty of water, or practicing deep breathing. °This information is not intended to replace advice given to you by your health care provider. Make sure you discuss any questions you have with your health care provider. °Document Released: 09/20/2016 Document Revised: 09/20/2016 Document Reviewed: 09/20/2016 °Elsevier Interactive Patient Education © 2018 [employer] ° °

## 2017-06-10 LAB — CULTURE, BETA STREP (GROUP B ONLY): STREP GP B CULTURE: NEGATIVE

## 2017-06-14 ENCOUNTER — Ambulatory Visit (INDEPENDENT_AMBULATORY_CARE_PROVIDER_SITE_OTHER): Payer: BLUE CROSS/BLUE SHIELD | Admitting: Maternal Newborn

## 2017-06-14 ENCOUNTER — Encounter: Payer: Self-pay | Admitting: Maternal Newborn

## 2017-06-14 VITALS — BP 126/76 | Wt 227.0 lb

## 2017-06-14 DIAGNOSIS — O0993 Supervision of high risk pregnancy, unspecified, third trimester: Secondary | ICD-10-CM

## 2017-06-14 DIAGNOSIS — Z3A37 37 weeks gestation of pregnancy: Secondary | ICD-10-CM

## 2017-06-14 NOTE — Progress Notes (Signed)
No concerns.rj 

## 2017-06-14 NOTE — Progress Notes (Signed)
Routine Prenatal Care Visit  Subjective  Deborah House is a 27 y.o. G2P0010 at 4952w1d being seen today for ongoing prenatal care.  She is currently monitored for the following issues for this high-risk pregnancy and has Supervision of high risk pregnancy, antepartum, third trimester; Obesity affecting pregnancy; Nausea and vomiting in pregnancy; Low lying placenta nos or without hemorrhage, second trimester; and Anemia affecting pregnancy on their problem list.  ----------------------------------------------------------------------------------- Patient reports no complaints.   Contractions: Not present. Vag. Bleeding: None.  Movement: Present. Denies leaking of fluid.  ----------------------------------------------------------------------------------- The following portions of the patient's history were reviewed and updated as appropriate: allergies, current medications, past family history, past medical history, past social history, past surgical history and problem list. Problem list updated.   Objective  Last menstrual period 09/20/2016. Pregravid weight 210 lb (95.3 kg) Total Weight Gain 17 lb (7.711 kg) Urinalysis: Urine Protein: Negative Urine Glucose: Negative  Fetal Status: Fetal Heart Rate (bpm): 148 Fundal Height: 37 cm Movement: Present     General:  Alert, oriented and cooperative. Patient is in no acute distress.  Skin: Skin is warm and dry. No rash noted.   Cardiovascular: Normal heart rate noted  Respiratory: Normal respiratory effort, no problems with respiration noted  Abdomen: Soft, gravid, appropriate for gestational age. Pain/Pressure: Present     Pelvic:  Cervical exam deferred        Extremities: Normal range of motion.  Edema: None  Mental Status: Normal mood and affect. Normal behavior. Normal judgment and thought content.     Assessment   26 y.o. G2P0010 at 6552w1d, EDD 07/04/2017 by Ultrasound presenting for routine prenatal visit.  Plan    pregnancy2 Problems (from 09/20/16 to present)    Problem Noted Resolved   Anemia affecting pregnancy 05/15/2017 by Vena AustriaStaebler, Andreas, MD No   Overview Signed 05/15/2017  1:16 PM by Vena AustriaStaebler, Andreas, MD    Hgb 8.0 at 28 weeks [X]  hematology referral      Low lying placenta nos or without hemorrhage, second trimester 02/19/2017 by Vena AustriaStaebler, Andreas, MD No   Overview Addendum 05/31/2017  1:08 PM by Vena AustriaStaebler, Andreas, MD    [X]  Follow up 28 weeks and [X]  32 weeks remains low lying at 1.1cm  Posterior      Encounter for supervision of low-risk pregnancy, antepartum 11/07/2016 by Nadara MustardHarris, Robert P, MD No   Overview Addendum 03/19/2017 10:51 AM by Vena AustriaStaebler, Andreas, MD    Clinic [facility] Prenatal Labs  Dating 8 week US Blood type: O/Negative/-- (06/20 1416)   Genetic Screen  1st tri neg Antibody:Negative (06/20 1416)  Anatomic US 02/19/17 XX Complete on follow up 03/19/17 but low lying placenta Rubella: 1.68 (06/20 1416) Varicella: Immune  GTT Early:               Third trimester:  RPR: Non Reactive (06/20 1416)   Rhogam [ ]  28 weeks HBsAg: Negative (06/20 1416)   TDaP vaccine                       Flu Shot: declined HIV: Negative  Baby Food                                GBS:   Contraception  Pap: 07/20/15 NIL  CBB     CS/VBAC    Support Person  Obesity affecting pregnancy 11/07/2016 by Nadara Mustard, MD No   Overview Signed 02/19/2017  9:59 AM by [name], MD    Body mass index is 33.89 kg/m.         Term labor symptoms and general obstetric precautions including but not limited to vaginal bleeding, contractions, leaking of fluid and fetal movement were reviewed in detail with the patient.   Return in about 1 week (around 06/21/2017) for ROB.  Marcelyn Bruins, CNM 06/14/2017  10:13 AM

## 2017-06-20 ENCOUNTER — Ambulatory Visit (INDEPENDENT_AMBULATORY_CARE_PROVIDER_SITE_OTHER): Payer: BLUE CROSS/BLUE SHIELD | Admitting: Advanced Practice Midwife

## 2017-06-20 ENCOUNTER — Encounter: Payer: Self-pay | Admitting: Advanced Practice Midwife

## 2017-06-20 VITALS — BP 124/80 | Wt 228.0 lb

## 2017-06-20 DIAGNOSIS — Z3A38 38 weeks gestation of pregnancy: Secondary | ICD-10-CM

## 2017-06-20 NOTE — Progress Notes (Signed)
No vb. No lof.  

## 2017-06-20 NOTE — Progress Notes (Signed)
Routine Prenatal Care Visit  Subjective  Deborah House is a 27 y.o. G2P0010 at [redacted]w[redacted]d being seen today for ongoing prenatal care.  She is currently monitored for the following issues for this high-risk pregnancy and has Supervision of high risk pregnancy, antepartum, third trimester; Obesity affecting pregnancy; Nausea and vomiting in pregnancy; Low lying placenta nos or without hemorrhage, second trimester; and Anemia affecting pregnancy on their problem list.  ----------------------------------------------------------------------------------- [name] has some questions about pain medicine after c/s. She prefers to not take narcotics, "strong medicines". Contractions: Not present. Vag. Bleeding: None.  Movement: Present. Denies leaking of fluid.  ----------------------------------------------------------------------------------- The following portions of the patient's history were reviewed and updated as appropriate: allergies, current medications, past family history, past medical history, past social history, past surgical history and problem list. Problem list updated.   Objective  Blood pressure 124/80, last menstrual period 09/20/2016. Pregravid weight 210 lb (95.3 kg) Total Weight Gain 18 lb (8.165 kg) Urinalysis: Urine Protein: Negative Urine Glucose: Negative  Fetal Status: Fetal Heart Rate (bpm): 130 Fundal Height: 40 cm Movement: Present  Presentation: Vertex  General:  Alert, oriented and cooperative. Patient is in no acute distress.  Skin: Skin is warm and dry. No rash noted.   Cardiovascular: Normal heart rate noted  Respiratory: Normal respiratory effort, no problems with respiration noted  Abdomen: Soft, gravid, appropriate for gestational age. Pain/Pressure: Absent     Pelvic:  Cervical exam deferred        Extremities: Normal range of motion.  Edema: None  Mental Status: Normal mood and affect. Normal behavior. Normal judgment and  thought content.   Assessment   27 y.o. G2P0010 at [redacted]w[redacted]d by  07/04/2017, by Ultrasound presenting for routine prenatal visit  Plan   pregnancy2 Problems (from 09/20/16 to present)    Problem Noted Resolved   Anemia affecting pregnancy 05/15/2017 by [name], MD No   Overview Signed 05/15/2017  1:16 PM by [name], MD    Hgb 8.0 at 28 weeks [X]  hematology referral      Low lying placenta nos or without hemorrhage, second trimester 02/19/2017 by [name], MD No   Overview Addendum 05/31/2017  1:08 PM by [name], MD    [X]  Follow up 28 weeks and [X]  32 weeks remains low lying at 1.1cm  Posterior      Supervision of high risk pregnancy, antepartum, third trimester 11/07/2016 by Nadara Mustard, MD No   Overview Addendum 03/19/2017 10:51 AM by [name], MD    [facility]inic [facility] Prenatal Labs  Dating 8 week [place] Blood type: O/Negative/-- (06/20 1416)   Genetic Screen  1st tri neg Antibody:Negative (06/20 1416)  Anatomic [place] 02/19/17 XX Complete on follow up 03/19/17 but low lying placenta Rubella: 1.68 (06/20 1416) Varicella: Immune  GTT Early:               Third trimester:  RPR: Non Reactive (06/20 1416)   Rhogam [ ]  28 weeks HBsAg: Negative (06/20 1416)   TDaP vaccine                       Flu Shot: declined HIV: Negative  Baby Food                                GBS:   Contraception  Pap: 07/20/15 NIL  CBB     CS/VBAC  Support Person               Obesity affecting pregnancy 11/07/2016 by Nadara MustardHarris, Robert P, MD No   Overview Signed 02/19/2017  9:59 AM by Vena AustriaStaebler, Andreas, MD    Body mass index is 33.89 kg/m.           Term labor symptoms and general obstetric precautions including but not limited to vaginal bleeding, contractions, leaking of fluid and fetal movement were reviewed in detail with the patient.   Return for has c/s on 2/7/ H&P on 2/6.  Tresea MallJane Joeanna Howdyshell, CNM  06/20/2017 9:15 AM

## 2017-06-26 ENCOUNTER — Encounter: Payer: Self-pay | Admitting: Obstetrics and Gynecology

## 2017-06-26 ENCOUNTER — Other Ambulatory Visit: Payer: Self-pay

## 2017-06-26 ENCOUNTER — Ambulatory Visit (INDEPENDENT_AMBULATORY_CARE_PROVIDER_SITE_OTHER): Payer: BLUE CROSS/BLUE SHIELD | Admitting: Obstetrics and Gynecology

## 2017-06-26 ENCOUNTER — Encounter
Admission: RE | Admit: 2017-06-26 | Discharge: 2017-06-26 | Disposition: A | Discharge status: Home / Self Care | Payer: BLUE CROSS/BLUE SHIELD | Source: Ambulatory Visit | Attending: Obstetrics and Gynecology | Admitting: Obstetrics and Gynecology

## 2017-06-26 ENCOUNTER — Inpatient Hospital Stay: Admission: RE | Admit: 2017-06-26 | Payer: BLUE CROSS/BLUE SHIELD | Source: Ambulatory Visit

## 2017-06-26 VITALS — BP 110/70 | HR 97 | Ht 66.0 in | Wt 228.0 lb

## 2017-06-26 DIAGNOSIS — O99013 Anemia complicating pregnancy, third trimester: Secondary | ICD-10-CM

## 2017-06-26 DIAGNOSIS — D62 Acute posthemorrhagic anemia: Secondary | ICD-10-CM | POA: Diagnosis not present

## 2017-06-26 DIAGNOSIS — O4443 Low lying placenta NOS or without hemorrhage, third trimester: Secondary | ICD-10-CM | POA: Diagnosis not present

## 2017-06-26 DIAGNOSIS — O9081 Anemia of the puerperium: Secondary | ICD-10-CM | POA: Diagnosis not present

## 2017-06-26 DIAGNOSIS — Z3A39 39 weeks gestation of pregnancy: Secondary | ICD-10-CM | POA: Diagnosis not present

## 2017-06-26 DIAGNOSIS — Z23 Encounter for immunization: Secondary | ICD-10-CM | POA: Diagnosis not present

## 2017-06-26 DIAGNOSIS — Z3A38 38 weeks gestation of pregnancy: Secondary | ICD-10-CM

## 2017-06-26 DIAGNOSIS — O99210 Obesity complicating pregnancy, unspecified trimester: Secondary | ICD-10-CM

## 2017-06-26 DIAGNOSIS — O4442 Low lying placenta NOS or without hemorrhage, second trimester: Secondary | ICD-10-CM

## 2017-06-26 HISTORY — DX: Low lying placenta nos or without hemorrhage, third trimester: O44.43

## 2017-06-26 HISTORY — DX: Anemia, unspecified: D64.9

## 2017-06-26 LAB — CBC
HCT: 34.7 % — ABNORMAL LOW (ref 35.0–47.0)
HEMOGLOBIN: 10.8 g/dL — AB (ref 12.0–16.0)
MCH: 23.9 pg — AB (ref 26.0–34.0)
MCHC: 31.2 g/dL — ABNORMAL LOW (ref 32.0–36.0)
MCV: 76.4 fL — AB (ref 80.0–100.0)
Platelets: 242 10*3/uL (ref 150–440)
RBC: 4.54 MIL/uL (ref 3.80–5.20)
RDW: 26.7 % — ABNORMAL HIGH (ref 11.5–14.5)
WBC: 8.1 10*3/uL (ref 3.6–11.0)

## 2017-06-26 NOTE — Patient Instructions (Signed)
Your procedure is scheduled on: June 27, 2017 THURSDAY Report to  EMERGENCY DEPARTMENT AT 5:30 AM   REMEMBER: Instructions that are not followed completely may result in serious medical risk, up to and including death; or upon the discretion of your surgeon and anesthesiologist your surgery may need to be rescheduled.  Do not eat food OR  Drink after midnight the night before your procedure.  No gum chewing or hard candies.  No Alcohol for 24 hours before or after surgery.  No Smoking including e-cigarettes for 24 hours prior to surgery. No chewable tobacco products for at least 6 hours prior to surgery. No nicotine patches on the day of surgery.  On the morning of surgery brush your teeth with toothpaste and water, you may rinse your mouth with mouthwash if you wish. Do not swallow any  toothpaste of mouthwash.  Notify your doctor if there is any change in your medical condition (cold, fever, infection).  Do not wear jewelry, make-up, hairpins, clips or nail polish.  Do not wear lotions, powders, or perfumes. You may wear deodorant.  Do not shave 48 hours prior to surgery. Men may shave face and neck.  Contacts and dentures may not be worn into surgery.  Do not bring valuables to the hospital. [facility] for any belongings or valuables.   TAKE THESE MEDICATIONS THE MORNING OF SURGERY WITH A SIP OF WATER:  None  Use CHG Soap or wipes as directed on instruction sheet.  Stop Anti-inflammatories such as Advil, Aleve, Ibuprofen, Motrin, Naproxen, Naprosyn, Goodie powder, or aspirin products. (May take Tylenol or Acetaminophen if needed.)  Stop ANY OVER THE COUNTER supplements until after surgery. (May continue Vitamin D, Vitamin B, and multivitamin.)  If you are being admitted to the hospital overnight, leave your suitcase in the car. After surgery it may be brought to your room.  If you are being discharged the day of surgery, you will not be allowed to  drive home. You will need someone to drive you home and stay with you that night.   If you are taking public transportation, you will need to have a responsible adult to with you.  Please call the number above if you have any questions about these instructions.

## 2017-06-26 NOTE — Progress Notes (Signed)
Obstetric H&P   Chief Complaint: Scheduled C-section  Prenatal Care Provider: [facility]  History of Present Illness: 27 y.o. G2P0010 8032w6d by 07/04/2017, by Ultrasound presenting to for scheduled C-section secondary to low lying placenta.  +FM, no LOF, no VB, No ctx.  Pregnancy complicated by anemia and low lying placenta.  Pregravid weight 210 lb (95.3 kg) Total Weight Gain 18 lb (8.165 kg)  pregnancy2 Problems (from 09/20/16 to present)    Problem Noted Resolved   Anemia affecting pregnancy 05/15/2017 by Vena AustriaStaebler, Finian Helvey, MD No   Overview Signed 05/15/2017  1:16 PM by Vena AustriaStaebler, Alsha Meland, MD    Hgb 8.0 at 28 weeks [X]  hematology referral      Low lying placenta nos or without hemorrhage, second trimester 02/19/2017 by Vena AustriaStaebler, Dezire Turk, MD No   Overview Addendum 05/31/2017  1:08 PM by Vena AustriaStaebler, Cesar Rogerson, MD    [X]  Follow up 28 weeks and [X]  32 weeks remains low lying at 1.1cm  Posterior      Supervision of high risk pregnancy, antepartum, third trimester 11/07/2016 by Nadara MustardHarris, Robert P, MD No   Overview Addendum 03/19/2017 10:51 AM by Vena AustriaStaebler, Ardel Jagger, MD    [facility]inic [facility] Prenatal Labs  Dating 8 week US Blood type: O/Negative/-- (06/20 1416)   Genetic Screen  1st tri neg Antibody:Negative (06/20 1416)  Anatomic US 02/19/17 XX Complete on follow up 03/19/17 but low lying placenta Rubella: 1.68 (06/20 1416) Varicella: Immune  GTT Early:               Third trimester:  RPR: Non Reactive (06/20 1416)   Rhogam [ ]  28 weeks HBsAg: Negative (06/20 1416)   TDaP vaccine                       Flu Shot: declined HIV: Negative  Baby Food                                GBS:   Contraception  Pap: 07/20/15 NIL  CBB     CS/VBAC    Support Person               Obesity affecting pregnancy 11/07/2016 by Nadara MustardHarris, Robert P, MD No   Overview Signed 02/19/2017  9:59 AM by Vena AustriaStaebler, Dealva Lafoy, MD    Body mass index is 33.89 kg/m.           Review of Systems: 10 point review of systems negative  unless otherwise noted in HPI  Past Medical History: Past Medical History:  Diagnosis Date  . Abdominal pain   . Amenorrhea   . Chlamydia 2012   tx'd  . Family history of ovarian cancer 07/2015   genetic testing letter sent  . Hemorrhoids   . [facility] abortion [facility]01/2014   [facility]    Past Surgical History: Past Surgical History:  Procedure Laterality Date  . DILATION AND CURETTAGE OF UTERUS  06/01/2014   Family History: Family History  Problem Relation Age of Onset  . Diabetes Maternal Grandmother   . Ovarian cancer Maternal Grandmother 60  . Anemia Mother   . Anemia Sister     Social History: Social History   Socioeconomic History  . Marital status: Married    Spouse name: Not on file  . Number of children: 0  . Years of education: 6812  . Highest education level: Not on file  Social Needs  . Financial resource strain: Not on file  .  Food insecurity - worry: Not on file  . Food insecurity - inability: Not on file  . Transportation needs - medical: Not on file  . Transportation needs - non-medical: Not on file  Occupational History  . Occupation: RETAIL SALES  Tobacco Use  . Smoking status: Never Smoker  . Smokeless tobacco: Never Used  Substance and Sexual Activity  . Alcohol use: No  . Drug use: No  . Sexual activity: Not on file  Other Topics Concern  . Not on file  Social History Narrative  . Not on file    Medications: Prior to Admission medications   Medication Sig Start Date End Date Taking? Authorizing Provider  calcium carbonate (TUMS - DOSED IN MG ELEMENTAL CALCIUM) 500 MG chewable tablet Chew 1-2 tablets by mouth daily as needed for indigestion or heartburn.   Yes [provider]  Doxylamine-Pyridoxine (DICLEGIS) 10-10 MG TBEC Take 2 tablets by mouth at bedtime. If symptoms persist, add one tablet in the morning and one in the afternoon 05/17/17  Yes [name], MD  nystatin cream (MYCOSTATIN) Apply 1 application 2 (two) times daily  topically. Patient taking differently: Apply 1 application topically 2 (two) times daily as needed (FOR DRY/IRRITATED SKIN.).  04/05/17  Yes Oswaldo Conroy, CNM  Prenatal Vit-Fe Fumarate-FA (PRENATAL MULTIVITAMIN) TABS tablet Take 1 tablet by mouth daily.   Yes [provider]  terconazole (TERAZOL 7) 0.4 % vaginal cream Place 1 applicator at bedtime vaginally. Use nightly for 7 nights, then use once weekly for six months. Patient taking differently: Place 1 applicator vaginally every Monday. Use nightly for 7 nights, then use once weekly for six months. 04/05/17  Yes Oswaldo Conroy, CNM    Allergies: Allergies  Allergen Reactions  . Ibuprofen Swelling    Suspected--unsure if it is a TRUE allergy.    Physical Exam: Vitals: Blood pressure 110/70, pulse 97, height 5\' 6"  (1.676 m), weight 228 lb (103.4 kg), last menstrual period 09/20/2016.   FHT: positive  General: NAD HEENT: normocephalic, anicteric Pulmonary: No increased work of breathing Cardiovascular: RRR, distal pulses 2+ Abdomen: Gravid, non-tender Leopolds: vtx Genitourinary: deferred Extremities: no edema, erythema, or tenderness Neurologic: Grossly intact Psychiatric: mood appropriate, affect full  Labs: No results found for this or any previous visit (from the past 24 hour(s)).  Assessment: 27 y.o. G2P0010 [redacted]w[redacted]d by 07/04/2017, by Ultrasound presenting for scheduled C-section for low lying placenta  Plan: 1) The patient was counseled regarding risk and benefits to proceeding with Cesarean section to expedite delivery.  Risk of cesarean section were discussed including risk of bleeding and need for potential intraoperative or postoperative blood transfusion with a rate of approximately 5% quoted for all Cesarean sections, risk of injury to adjacent organs including but not limited to bowl and bladder, the need for additional surgical procedures to address such injuries, and the risk of infection.   2)  Fetus - positive FHT  3) PNL - see HPI  4) Immunization History -  Immunization History  Administered Date(s) Administered  . Influenza,inj,Quad PF,6+ Mos 04/30/2017  . Tdap 04/30/2017    5) Disposition - pending delivery

## 2017-06-27 ENCOUNTER — Inpatient Hospital Stay: Payer: BLUE CROSS/BLUE SHIELD | Admitting: Anesthesiology

## 2017-06-27 ENCOUNTER — Encounter: Payer: Self-pay | Admitting: *Deleted

## 2017-06-27 ENCOUNTER — Encounter
Admission: RE | Disposition: A | Discharge status: Home / Self Care | Payer: Self-pay | Source: Ambulatory Visit | Attending: Obstetrics and Gynecology

## 2017-06-27 ENCOUNTER — Inpatient Hospital Stay
Admission: RE | Admit: 2017-06-27 | Discharge: 2017-06-30 | DRG: 787 | Disposition: A | Discharge status: Home / Self Care | Payer: BLUE CROSS/BLUE SHIELD | Source: Ambulatory Visit | Attending: Obstetrics and Gynecology | Admitting: Obstetrics and Gynecology

## 2017-06-27 DIAGNOSIS — D62 Acute posthemorrhagic anemia: Secondary | ICD-10-CM | POA: Diagnosis not present

## 2017-06-27 DIAGNOSIS — Z3A39 39 weeks gestation of pregnancy: Secondary | ICD-10-CM | POA: Diagnosis not present

## 2017-06-27 DIAGNOSIS — O4443 Low lying placenta NOS or without hemorrhage, third trimester: Principal | ICD-10-CM | POA: Diagnosis present

## 2017-06-27 DIAGNOSIS — O9081 Anemia of the puerperium: Secondary | ICD-10-CM | POA: Diagnosis not present

## 2017-06-27 LAB — RPR: RPR: NONREACTIVE

## 2017-06-27 LAB — PREPARE RBC (CROSSMATCH)

## 2017-06-27 LAB — ABO/RH: ABO/RH(D): O NEG

## 2017-06-27 SURGERY — Surgical Case
Anesthesia: Spinal

## 2017-06-27 MED ORDER — FENTANYL CITRATE (PF) 250 MCG/5ML IJ SOLN
INTRAMUSCULAR | Status: DC | PRN
  Administered 2017-06-27: 50 ug via INTRAVENOUS

## 2017-06-27 MED ORDER — NALBUPHINE HCL 10 MG/ML IJ SOLN: 5.0000 mg | INTRAMUSCULAR | Status: DC | PRN

## 2017-06-27 MED ORDER — PRENATAL MULTIVITAMIN CH
1.0000 | ORAL_TABLET | Freq: Every day | ORAL | Status: DC
  Administered 2017-06-28 – 2017-06-29 (×2): 1 via ORAL
  Filled 2017-06-27 (×2): qty 1

## 2017-06-27 MED ORDER — ONDANSETRON HCL 4 MG/2ML IJ SOLN
INTRAMUSCULAR | Status: AC
  Filled 2017-06-27: qty 2

## 2017-06-27 MED ORDER — ONDANSETRON HCL 4 MG/2ML IJ SOLN
INTRAMUSCULAR | Status: DC | PRN
  Administered 2017-06-27: 4 mg via INTRAVENOUS

## 2017-06-27 MED ORDER — PHENYLEPHRINE HCL 10 MG/ML IJ SOLN
INTRAMUSCULAR | Status: DC | PRN
  Administered 2017-06-27: 100 ug via INTRAVENOUS
  Administered 2017-06-27: 200 ug via INTRAVENOUS
  Administered 2017-06-27 (×2): 100 ug via INTRAVENOUS
  Administered 2017-06-27: 300 ug via INTRAVENOUS

## 2017-06-27 MED ORDER — BUPIVACAINE 0.25 % ON-Q PUMP DUAL CATH 400 ML
400.0000 mL | INJECTION | Status: DC
  Filled 2017-06-27: qty 400

## 2017-06-27 MED ORDER — PROMETHAZINE HCL 25 MG/ML IJ SOLN
6.2500 mg | INTRAMUSCULAR | Status: DC | PRN
  Administered 2017-06-27: 6.25 mg via INTRAVENOUS
  Filled 2017-06-27: qty 1

## 2017-06-27 MED ORDER — OXYTOCIN 40 UNITS IN LACTATED RINGERS INFUSION - SIMPLE MED
2.5000 [IU]/h | INTRAVENOUS | Status: AC
  Administered 2017-06-27 (×2): 2.5 [IU]/h via INTRAVENOUS

## 2017-06-27 MED ORDER — OXYTOCIN 40 UNITS IN LACTATED RINGERS INFUSION - SIMPLE MED
INTRAVENOUS | Status: DC | PRN
  Administered 2017-06-27: 600 mL via INTRAVENOUS

## 2017-06-27 MED ORDER — BUPIVACAINE HCL (PF) 0.5 % IJ SOLN
20.0000 mL | INTRAMUSCULAR | Status: DC
  Filled 2017-06-27: qty 30

## 2017-06-27 MED ORDER — SODIUM CHLORIDE 0.9% FLUSH: 3.0000 mL | INTRAVENOUS | Status: DC | PRN

## 2017-06-27 MED ORDER — BUPIVACAINE HCL (PF) 0.5 % IJ SOLN
INTRAMUSCULAR | Status: AC
  Filled 2017-06-27: qty 30

## 2017-06-27 MED ORDER — OXYTOCIN 40 UNITS IN LACTATED RINGERS INFUSION - SIMPLE MED
INTRAVENOUS | Status: AC
  Filled 2017-06-27: qty 1000

## 2017-06-27 MED ORDER — SODIUM CHLORIDE FLUSH 0.9 % IV SOLN
INTRAVENOUS | Status: AC
  Filled 2017-06-27: qty 10

## 2017-06-27 MED ORDER — DEXAMETHASONE SODIUM PHOSPHATE 10 MG/ML IJ SOLN
INTRAMUSCULAR | Status: DC | PRN
  Administered 2017-06-27: 10 mg via INTRAVENOUS

## 2017-06-27 MED ORDER — COCONUT OIL OIL
1.0000 "application " | TOPICAL_OIL | Status: DC | PRN
  Filled 2017-06-27: qty 120

## 2017-06-27 MED ORDER — ONDANSETRON HCL 4 MG/2ML IJ SOLN
4.0000 mg | Freq: Once | INTRAMUSCULAR | Status: DC | PRN
Start: 2017-06-27 — End: 2017-06-27

## 2017-06-27 MED ORDER — LACTATED RINGERS IV BOLUS (SEPSIS)
1000.0000 mL | Freq: Once | INTRAVENOUS | Status: AC
  Administered 2017-06-27: 1000 mL via INTRAVENOUS

## 2017-06-27 MED ORDER — SODIUM CHLORIDE 0.9 % IJ SOLN
INTRAMUSCULAR | Status: AC
  Filled 2017-06-27: qty 50

## 2017-06-27 MED ORDER — FENTANYL CITRATE (PF) 100 MCG/2ML IJ SOLN: 25.0000 ug | INTRAMUSCULAR | Status: DC | PRN

## 2017-06-27 MED ORDER — MEPERIDINE HCL 25 MG/ML IJ SOLN: 6.2500 mg | INTRAMUSCULAR | Status: DC | PRN

## 2017-06-27 MED ORDER — SIMETHICONE 80 MG PO CHEW: 80.0000 mg | CHEWABLE_TABLET | ORAL | Status: DC | PRN

## 2017-06-27 MED ORDER — KETOROLAC TROMETHAMINE 30 MG/ML IJ SOLN: 30.0000 mg | Freq: Four times a day (QID) | INTRAMUSCULAR | Status: AC | PRN

## 2017-06-27 MED ORDER — BUPIVACAINE HCL (PF) 0.5 % IJ SOLN
INTRAMUSCULAR | Status: DC | PRN
Start: 2017-06-27 — End: 2017-06-27
  Administered 2017-06-27: 10 mL

## 2017-06-27 MED ORDER — DEXAMETHASONE SODIUM PHOSPHATE 10 MG/ML IJ SOLN
INTRAMUSCULAR | Status: AC
  Filled 2017-06-27: qty 1

## 2017-06-27 MED ORDER — LACTATED RINGERS IV SOLN: INTRAVENOUS | Status: DC

## 2017-06-27 MED ORDER — ONDANSETRON HCL 4 MG/2ML IJ SOLN: 4.0000 mg | Freq: Three times a day (TID) | INTRAMUSCULAR | Status: DC | PRN

## 2017-06-27 MED ORDER — NALOXONE HCL 0.4 MG/ML IJ SOLN: 0.4000 mg | INTRAMUSCULAR | Status: DC | PRN

## 2017-06-27 MED ORDER — OXYCODONE-ACETAMINOPHEN 5-325 MG PO TABS: 2.0000 | ORAL_TABLET | ORAL | Status: DC | PRN

## 2017-06-27 MED ORDER — LACTATED RINGERS IV SOLN
INTRAVENOUS | Status: DC | PRN
  Administered 2017-06-27: 08:00:00 via INTRAVENOUS

## 2017-06-27 MED ORDER — SIMETHICONE 80 MG PO CHEW
80.0000 mg | CHEWABLE_TABLET | Freq: Three times a day (TID) | ORAL | Status: DC
  Administered 2017-06-27 – 2017-06-29 (×7): 80 mg via ORAL
  Filled 2017-06-27 (×7): qty 1

## 2017-06-27 MED ORDER — DIPHENHYDRAMINE HCL 25 MG PO CAPS: 25.0000 mg | ORAL_CAPSULE | ORAL | Status: DC | PRN

## 2017-06-27 MED ORDER — FENTANYL CITRATE (PF) 100 MCG/2ML IJ SOLN
INTRAMUSCULAR | Status: AC
  Filled 2017-06-27: qty 2

## 2017-06-27 MED ORDER — SIMETHICONE 80 MG PO CHEW
80.0000 mg | CHEWABLE_TABLET | ORAL | Status: DC
  Administered 2017-06-27 – 2017-06-29 (×3): 80 mg via ORAL
  Filled 2017-06-27 (×3): qty 1

## 2017-06-27 MED ORDER — DIBUCAINE 1 % RE OINT: 1.0000 "application " | TOPICAL_OINTMENT | RECTAL | Status: DC | PRN

## 2017-06-27 MED ORDER — CEFAZOLIN SODIUM-DEXTROSE 2-4 GM/100ML-% IV SOLN
2.0000 g | INTRAVENOUS | Status: AC
  Administered 2017-06-27: 2 g via INTRAVENOUS
  Filled 2017-06-27 (×2): qty 100

## 2017-06-27 MED ORDER — DIPHENHYDRAMINE HCL 50 MG/ML IJ SOLN: 12.5000 mg | INTRAMUSCULAR | Status: DC | PRN

## 2017-06-27 MED ORDER — MENTHOL 3 MG MT LOZG
1.0000 | LOZENGE | OROMUCOSAL | Status: DC | PRN
Start: 2017-06-27 — End: 2017-06-30

## 2017-06-27 MED ORDER — SENNOSIDES-DOCUSATE SODIUM 8.6-50 MG PO TABS
2.0000 | ORAL_TABLET | ORAL | Status: DC
  Administered 2017-06-27 – 2017-06-29 (×3): 2 via ORAL
  Filled 2017-06-27 (×3): qty 2

## 2017-06-27 MED ORDER — BUPIVACAINE ON-Q PAIN PUMP (FOR ORDER SET NO CHG)
INJECTION | Status: AC
  Filled 2017-06-27: qty 1

## 2017-06-27 MED ORDER — WITCH HAZEL-GLYCERIN EX PADS: 1.0000 "application " | MEDICATED_PAD | CUTANEOUS | Status: DC | PRN

## 2017-06-27 MED ORDER — BUPIVACAINE IN DEXTROSE 0.75-8.25 % IT SOLN
INTRATHECAL | Status: DC | PRN
  Administered 2017-06-27: 1.7 mL via INTRATHECAL

## 2017-06-27 MED ORDER — OXYCODONE-ACETAMINOPHEN 5-325 MG PO TABS
1.0000 | ORAL_TABLET | ORAL | Status: DC | PRN
  Filled 2017-06-27: qty 1

## 2017-06-27 MED ORDER — ACETAMINOPHEN 500 MG PO TABS
1000.0000 mg | ORAL_TABLET | Freq: Four times a day (QID) | ORAL | Status: AC
  Administered 2017-06-27 – 2017-06-28 (×3): 1000 mg via ORAL
  Filled 2017-06-27 (×4): qty 2

## 2017-06-27 MED ORDER — DIPHENHYDRAMINE HCL 25 MG PO CAPS: 25.0000 mg | ORAL_CAPSULE | Freq: Four times a day (QID) | ORAL | Status: DC | PRN

## 2017-06-27 MED ORDER — SOD CITRATE-CITRIC ACID 500-334 MG/5ML PO SOLN
30.0000 mL | ORAL | Status: AC
  Administered 2017-06-27: 30 mL via ORAL
  Filled 2017-06-27: qty 15

## 2017-06-27 MED ORDER — MORPHINE SULFATE (PF) 0.5 MG/ML IJ SOLN
INTRAMUSCULAR | Status: DC | PRN
  Administered 2017-06-27: .1 mg via INTRATHECAL

## 2017-06-27 MED ORDER — MORPHINE SULFATE (PF) 0.5 MG/ML IJ SOLN
INTRAMUSCULAR | Status: AC
  Filled 2017-06-27: qty 10

## 2017-06-27 MED ORDER — ACETAMINOPHEN 325 MG PO TABS: 650.0000 mg | ORAL_TABLET | ORAL | Status: DC | PRN

## 2017-06-27 SURGICAL SUPPLY — 28 items
BAG COUNTER SPONGE EZ (MISCELLANEOUS) ×2 IMPLANT
CANISTER SUCT 3000ML PPV (MISCELLANEOUS) ×2 IMPLANT
CATH KIT ON-Q SILVERSOAK 5IN (CATHETERS) ×4 IMPLANT
CHLORAPREP W/TINT 26ML (MISCELLANEOUS) ×4 IMPLANT
DERMABOND ADVANCED (GAUZE/BANDAGES/DRESSINGS) ×1
DERMABOND ADVANCED .7 DNX12 (GAUZE/BANDAGES/DRESSINGS) ×1 IMPLANT
DRSG OPSITE POSTOP 4X10 (GAUZE/BANDAGES/DRESSINGS) ×2 IMPLANT
DRSG TELFA 3X8 NADH (GAUZE/BANDAGES/DRESSINGS) ×2 IMPLANT
ELECT CAUTERY BLADE 6.4 (BLADE) IMPLANT
ELECT REM PT RETURN 9FT ADLT (ELECTROSURGICAL) ×2
ELECTRODE REM PT RTRN 9FT ADLT (ELECTROSURGICAL) ×1 IMPLANT
GAUZE SPONGE 4X4 12PLY STRL (GAUZE/BANDAGES/DRESSINGS) ×2 IMPLANT
GLOVE BIO SURGEON STRL SZ7 (GLOVE) ×8 IMPLANT
GLOVE INDICATOR 7.5 STRL GRN (GLOVE) ×8 IMPLANT
GOWN STRL REUS W/ TWL LRG LVL3 (GOWN DISPOSABLE) ×3 IMPLANT
GOWN STRL REUS W/TWL LRG LVL3 (GOWN DISPOSABLE) ×3
NS IRRIG 1000ML POUR BTL (IV SOLUTION) ×2 IMPLANT
PACK C SECTION AR (MISCELLANEOUS) ×2 IMPLANT
PAD OB MATERNITY 4.3X12.25 (PERSONAL CARE ITEMS) ×2 IMPLANT
PAD PREP 24X41 OB/GYN DISP (PERSONAL CARE ITEMS) ×2 IMPLANT
STAPLER INSORB 30 2030 C-SECTI (MISCELLANEOUS) ×2 IMPLANT
STRIP CLOSURE SKIN 1/2X4 (GAUZE/BANDAGES/DRESSINGS) ×2 IMPLANT
SUCT VACUUM KIWI BELL (SUCTIONS) ×2 IMPLANT
SUT MNCRL AB 4-0 PS2 18 (SUTURE) ×2 IMPLANT
SUT PDS AB 1 TP1 96 (SUTURE) ×2 IMPLANT
SUT VIC AB 0 CTX 36 (SUTURE)
SUT VIC AB 0 CTX36XBRD ANBCTRL (SUTURE) IMPLANT
SUT VIC AB 2-0 CT1 36 (SUTURE) ×6 IMPLANT

## 2017-06-27 NOTE — Anesthesia Procedure Notes (Signed)
Spinal  Patient location during procedure: OR Start time: 06/27/2017 7:50 AM End time: 06/27/2017 7:58 AM Staffing Anesthesiologist: Berdine Addisonhomas, Mathai, MD Resident/CRNA: Dava NajjarFrazier, Terry Abila, CRNA Performed: resident/CRNA  Preanesthetic Checklist Completed: patient identified, surgical consent, pre-op evaluation, timeout performed, IV checked, risks and benefits discussed and monitors and equipment checked Spinal Block Patient position: sitting Prep: Betadine Patient monitoring: heart rate, continuous pulse ox and blood pressure Approach: midline Location: L2-3 Injection technique: single-shot Needle Needle type: Pencan  Needle gauge: 24 G Needle length: 10 cm Assessment Sensory level: T4

## 2017-06-27 NOTE — Anesthesia Post-op Follow-up Note (Signed)
Anesthesia QCDR form completed.        

## 2017-06-27 NOTE — H&P (Signed)
Date of Initial H&P: 06/26/2017  History reviewed, patient examined, no change in status, stable for surgery.  Low lying placenta not resolved on third trimester follow up.  Patient was offered one additional scan 06/26/17 but declined.    Vena AustriaAndreas Patience Nuzzo, MD [facility]

## 2017-06-27 NOTE — Lactation Note (Signed)
This note was copied from a baby's chart. Lactation Consultation Note  Patient Name: Girl Rae RoamShakima Herberger ZOXWR'UToday's Date: 06/27/2017 Reason for consult: Initial assessment   Maternal Data  No breast feeding class prenatally. Wants to breast feed  Feeding Feeding Type: Breast Fed  LATCH Score Latch: Repeated attempts needed to sustain latch, nipple held in mouth throughout feeding, stimulation needed to elicit sucking reflex.  Audible Swallowing: Spontaneous and intermittent  Type of Nipple: Everted at rest and after stimulation  Comfort (Breast/Nipple): Soft / non-tender  Hold (Positioning): Full assist, staff holds infant at breast  LATCH Score: 7  Interventions Interventions: Skin to skin;Breast feeding basics reviewed;Assisted with latch;Support pillows;Position options  Lactation Tools Discussed/Used     Consult Status Consult Status: PRN Follow-up type: In-patient    Trudee GripCarolyn P Imogean Ciampa 06/27/2017, 2:56 PM

## 2017-06-27 NOTE — Anesthesia Preprocedure Evaluation (Signed)
Anesthesia Evaluation  Patient identified by MRN, date of birth, ID band Patient awake    Reviewed: Allergy & Precautions, NPO status , Patient's Chart, lab work & pertinent test results, reviewed documented beta blocker date and time   Airway Mallampati: III  TM Distance: >3 FB     Dental  (+) Chipped   Pulmonary           Cardiovascular      Neuro/Psych    GI/Hepatic   Endo/Other    Renal/GU      Musculoskeletal   Abdominal   Peds  Hematology  (+) anemia ,   Anesthesia Other Findings Obese/  Reproductive/Obstetrics                             Anesthesia Physical Anesthesia Plan  ASA: II  Anesthesia Plan: Spinal   Post-op Pain Management:    Induction:   PONV Risk Score and Plan:   Airway Management Planned:   Additional Equipment:   Intra-op Plan:   Post-operative Plan:   Informed Consent: I have reviewed the patients History and Physical, chart, labs and discussed the procedure including the risks, benefits and alternatives for the proposed anesthesia with the patient or authorized representative who has indicated his/her understanding and acceptance.     Plan Discussed with: CRNA  Anesthesia Plan Comments:         Anesthesia Quick Evaluation

## 2017-06-27 NOTE — Transfer of Care (Signed)
Immediate Anesthesia Transfer of Care Note  Patient: Deborah House  Procedure(s) Performed: CESAREAN SECTION (N/A )  Patient Location: PACU  Anesthesia Type:Spinal  Level of Consciousness: awake, alert , oriented and patient cooperative  Airway & Oxygen Therapy: Patient Spontanous Breathing  Post-op Assessment: Report given to RN and Post -op Vital signs reviewed and stable  Post vital signs: Reviewed and stable  Last Vitals: There were no vitals filed for this visit.  Last Pain: There were no vitals filed for this visit.       Complications: No apparent anesthesia complications

## 2017-06-27 NOTE — Op Note (Signed)
Preoperative Diagnosis: 1) 27 y.o. G2P1011 at 9182w0d 2) Low lying placenta  Postoperative Diagnosis: 1) 27 y.o. G2P1011 at 3482w0d 2) Low lying placenta  Operation Performed: Primary low transverse C-section via pfannenstiel skin incision  Indication: Low lying placent  Anesthesia: Spinal  Primary Surgeon: Vena AustriaAndreas Eknoor Novack, MD  Assistant: Bernarda Caffeyhristiana Schumann, MD  Preoperative Antibiotics: 2g Ancef  Estimated Blood Loss: 700 mL  IV Fluids: 1L  Urine Output:: 100mL  Drains or Tubes: Foley to gravity drainage, ON-Q catheter system  Implants: none  Specimens Removed: none  Complications: none  Intraoperative Findings:  Normal tubes ovaries and uterus.  Delivery resulted in the birth of a liveborn female, APGAR (1 MIN): 8   APGAR (5 MINS): 9, weight 7lbs 10oz  Patient Condition: stable  Procedure in Detail:  Patient was taken to the operating room were she was administered regional anesthesia.  She was positioned in the supine position, prepped and draped in the  Usual sterile fashion.  Prior to proceeding with the case a time out was performed and the level of anesthetic was checked and noted to be adequate.  Utilizing the scalpel a pfannenstiel skin incision was made 2cm above the pubic symphysis and carried down sharply to the the level of the rectus fascia.  The fascia was incised in the midline using the scalpel and then extended using mayo scissors.  The superior border of the rectus fascia was grasped with two Kocher clamps and the underlying rectus muscles were dissected of the fascia using blunt dissection.  The median raphae was incised using Mayo scissors.   The inferior border of the rectus fascia was dissected of the rectus muscles in a similar fashion.  The midline was identified, the peritoneum was entered bluntly and expanded using manual tractions.  The uterus was noted to be in a none rotated position.  Next the bladder blade was placed retracting the bladder  caudally.  A bladder flap was not created.  A low transverse incision was scored on the lower uterine segment.  The hysterotomy was entered bluntly using the operators finger.  The hysterotomy incision was extended using manual traction.  The operators hand was placed within the hysterotomy position noting the fetus to be within the OA position.  The vertex was grasped, flexed, brought to the incision, vacuum was applied with one pop off, before the vertex delivered a traumatically using fundal pressure.  The remainder of the body delivered with ease.  The infant was suctioned, cord was clamped and cut before handing off to the awaiting neonatologist.  The placenta was delivered using manual extraction.  The uterus was exteriorized, wiped clean of clots and debris using two moist laps.  The hysterotomy was closed using a two layer closure of 0 Vicryl, with the first being a running locked, the second a vertical imbricating.  The uterus was returned to the abdomen.  The peritoneal gutters were wiped clean of clots and debris using two moist laps.  The hysterotomy incision was re-inspected noted to be hemostatic.  The peritoneum was closed using a running 2-0 Vicryl stitch.  The rectus muscles were inspected noted to be hemostatic.  The superior border of the rectus fascia was grasped with a Kocher clamp.  The ON-Q trocars were then placed 4cm above the superior border of the incision and tunneled subfascially.  The introducers were removed and the catheters were threaded through the sleeves after which the sleeves were removed.  The fascia was closed using a looped #1 PDS  in a running fashion taking 1cm by 1cm bites.  The subcutaneous tissue was irrigated using warm saline, hemostasis achieved using the bovie.  The subcutaneous dead space was less 3cm and was not closed.  The skin was closed using Insorb.  Sponge needle and instrument counts were corrects times two.  The patient tolerated the procedure well and was  taken to the recovery room in stable condition.

## 2017-06-28 LAB — TYPE AND SCREEN
ABO/RH(D): O NEG
Antibody Screen: POSITIVE
Extend sample reason: UNDETERMINED
Unit division: 0
Unit division: 0

## 2017-06-28 LAB — BPAM RBC
Blood Product Expiration Date: 201902212359
Blood Product Expiration Date: 201902212359
Unit Type and Rh: 9500
Unit Type and Rh: 9500

## 2017-06-28 LAB — FETAL SCREEN: Fetal Screen: NEGATIVE

## 2017-06-28 LAB — CBC
HCT: 30.2 % — ABNORMAL LOW (ref 35.0–47.0)
Hemoglobin: 9.6 g/dL — ABNORMAL LOW (ref 12.0–16.0)
MCH: 24.5 pg — ABNORMAL LOW (ref 26.0–34.0)
MCHC: 31.9 g/dL — ABNORMAL LOW (ref 32.0–36.0)
MCV: 76.8 fL — AB (ref 80.0–100.0)
Platelets: 231 10*3/uL (ref 150–440)
RBC: 3.93 MIL/uL (ref 3.80–5.20)
RDW: 26.6 % — AB (ref 11.5–14.5)
WBC: 17.5 10*3/uL — AB (ref 3.6–11.0)

## 2017-06-28 MED ORDER — OXYCODONE HCL 5 MG PO TABS
5.0000 mg | ORAL_TABLET | ORAL | Status: DC | PRN
  Administered 2017-06-28: 5 mg via ORAL
  Filled 2017-06-28: qty 1

## 2017-06-28 MED ORDER — RHO D IMMUNE GLOBULIN 1500 UNIT/2ML IJ SOSY
300.0000 ug | PREFILLED_SYRINGE | Freq: Once | INTRAMUSCULAR | Status: AC
  Administered 2017-06-28: 300 ug via INTRAMUSCULAR
  Filled 2017-06-28: qty 2

## 2017-06-28 MED ORDER — OXYCODONE HCL 5 MG PO TABS
10.0000 mg | ORAL_TABLET | ORAL | Status: DC | PRN
  Administered 2017-06-29: 10 mg via ORAL
  Filled 2017-06-28: qty 2

## 2017-06-28 MED ORDER — ACETAMINOPHEN 500 MG PO TABS
1000.0000 mg | ORAL_TABLET | Freq: Four times a day (QID) | ORAL | Status: DC | PRN
  Administered 2017-06-28 – 2017-06-29 (×5): 1000 mg via ORAL
  Filled 2017-06-28 (×4): qty 2

## 2017-06-28 MED ORDER — FERROUS SULFATE 325 (65 FE) MG PO TABS
325.0000 mg | ORAL_TABLET | Freq: Every day | ORAL | Status: DC
  Administered 2017-06-28 – 2017-06-29 (×2): 325 mg via ORAL
  Filled 2017-06-28 (×2): qty 1

## 2017-06-28 NOTE — Lactation Note (Signed)
This note was copied from a baby's chart. Lactation Consultation Note  Patient Name: Deborah Rae RoamShakima Clifton [id number]'VToday's Date: 06/28/2017 Reason for consult: Follow-up assessment   Maternal Data  Mom latched baby to breast by herself, she was nursing on right breast when I observed baby, mom states baby had nursed well on left, and had breastfed about 5 min on right and was falling asleep currently Feeding Feeding Type: Breast Fed Length of feed: 20 min(15 min on left breast)  LATCH Score Latch: Grasps breast easily, tongue down, lips flanged, rhythmical sucking.  Audible Swallowing: A few with stimulation  Type of Nipple: Flat  Comfort (Breast/Nipple): Soft / non-tender  Hold (Positioning): No assistance needed to correctly position infant at breast.  LATCH Score: 8  Interventions    Lactation Tools Discussed/Used Tools: Nipple Shields Nipple shield size: 20   Consult Status Consult Status: Follow-up Date: 06/29/17 Follow-up type: In-patient    Dyann KiefMarsha D Joh Rao 06/28/2017, 4:26 PM

## 2017-06-28 NOTE — Progress Notes (Signed)
POD#1 PLTCS for low-lying placenta Subjective:  Sitting up in bed, well-appearing.  Pain control is adequate with PRN medications, choosing not to use narcotic medications for now. Voiding without difficulty. Tolerating a regular diet. Ambulating well. Passing flatus.   Objective:  Blood pressure 106/67, pulse 88, temperature 98.7 F (37.1 C), temperature source Oral, resp. rate 18, height 5\' 6"  (1.676 m), weight 226 lb (102.5 kg), last menstrual period 09/20/2016, SpO2 98 %, unknown if currently breastfeeding.  General: NAD Pulmonary: no increased work of breathing, CTAB Abdomen: non-distended, non-tender, NABS, fundus firm at level of umbilicus, lochia rubra scant Incision: Dressing D/I with scant amount of old drainage midline, On-Q in place and infusing without difficulty Extremities: trace edema, no erythema, no tenderness  Results for orders placed or performed during the hospital encounter of 06/27/17 (from the past 72 hour(s))  ABO/Rh     Status: None   Collection Time: 06/27/17  9:44 AM  Result Value Ref Range   ABO/RH(D)      O NEG Performed at [facility], [address] [place], [place] [address]   Rhogam injection     Status: None (Preliminary result)   Collection Time: 06/28/17  4:13 AM  Result Value Ref Range   Unit Number [id number]    Blood Component Type RHIG    Unit division 00    Status of Unit ALLOCATED    Transfusion Status OK TO TRANSFUSE   CBC     Status: Abnormal   Collection Time: 06/28/17  4:13 AM  Result Value Ref Range   WBC 17.5 (H) 3.6 - 11.0 K/uL   RBC 3.93 3.80 - 5.20 MIL/uL   Hemoglobin 9.6 (L) 12.0 - 16.0 g/dL   HCT 08.630.2 (L) 57.835.0 - 46.947.0 %   MCV 76.8 (L) 80.0 - 100.0 fL   MCH 24.5 (L) 26.0 - 34.0 pg   MCHC 31.9 (L) 32.0 - 36.0 g/dL   RDW 62.926.6 (H) 52.811.5 - 41.314.5 %   Platelets 231 150 - 440 K/uL    Comment: Performed at [facility], [address] [place], [place] [address]  Fetal screen     Status: None   Collection  Time: 06/28/17  4:13 AM  Result Value Ref Range   Fetal Screen      NEG Performed at [facility], [address] [place], [place] [address]      Assessment:   27 y.o. G2P1011 post-operative day #1 in good condition.   Plan:  1) Acute blood loss anemia - hemodynamically stable and asymptomatic - PO ferrous sulfate  2) Blood Type --/--/O NEG Performed at [facility], [address] [place], [place] [address]  [phone](02/07 0944) / Ishmael Holterubella 1.68 (06/20 1416) / Varicella Immune  Information for the patient's newborn:  Deborah Mallinghorpe, Deborah House [[id number]][[id number]]  O POS RHIG ordered.  3) TDAP status: given 04/30/2017  4) Breast feeding  5) Contraception: unknown  5) Disposition: continue postpartum care, anticipate discharge POD #2 or 3.  Deborah House, CNM 06/28/2017  9:08 AM

## 2017-06-28 NOTE — Anesthesia Post-op Follow-up Note (Signed)
  Anesthesia Pain Follow-up Note  Patient: Deborah House  Day #: 1  Date of Follow-up: 06/28/2017 Time: 7:38 AM  Last Vitals:  Vitals:   06/27/17 2333 06/28/17 0734  BP: 111/64 106/67  Pulse: 79 88  Resp: 18 18  Temp: 36.9 C 37.1 C  SpO2: 100% 98%    Level of Consciousness: alert  Pain: none   Side Effects:None  Catheter Site Exam:clean, dry, no drainage     Plan: Continue current therapy of postop epidural at surgeon's request  Jakobie Henslee,  Sheran FavaMark R

## 2017-06-28 NOTE — Anesthesia Postprocedure Evaluation (Signed)
Anesthesia Post Note  Patient: Deborah House  Procedure(s) Performed: CESAREAN SECTION (N/A )  Patient location during evaluation: [facility] Unit Anesthesia Type: Spinal Level of consciousness: awake, awake and alert and oriented Pain management: pain level controlled Vital Signs Assessment: post-procedure vital signs reviewed and stable Respiratory status: spontaneous breathing Cardiovascular status: stable Postop Assessment: no headache, no backache, patient able to bend at knees and adequate PO intake Anesthetic complications: no     Last Vitals:  Vitals:   06/27/17 2333 06/28/17 0734  BP: 111/64 106/67  Pulse: 79 88  Resp: 18 18  Temp: 36.9 C 37.1 C  SpO2: 100% 98%    Last Pain:  Vitals:   06/28/17 0734  TempSrc: Oral  PainSc:                  Lyn RecordsNoles,  Virginio Isidore R

## 2017-06-28 NOTE — Lactation Note (Signed)
This note was copied from a baby's chart. Lactation Consultation Note  Patient Name: [name] [id number]'s Date: 06/28/2017     Maternal Data   Mom has large breasts, flat nipples, started use of 24 nipple shield yesterday, has difficulty with applying shield and getting it to stay on Feeding Baby fussy, arches back at breast, mom pulls breast up and shield comes off as baby is flailing at breast, repeated attempts, once able to latch baby, baby sucks a few times and falls asleep and mom is distracted and doesn't stimulate baby to continue to feed, latched easier to left breast and nursed longer  [facility]                   Interventions  see flowsheet, given 20 mm nipple shield and this seemed to help baby latch easier  Lactation Tools Discussed/Used     Consult Status  Followup today after hearing screen    Dyann KiefMarsha D Mia House 06/28/2017, 4:01 PM

## 2017-06-29 LAB — RHOGAM INJECTION: UNIT DIVISION: 0

## 2017-06-29 MED ORDER — OXYCODONE-ACETAMINOPHEN 7.5-325 MG PO TABS: 1.0000 | ORAL_TABLET | ORAL | Status: DC | PRN

## 2017-06-29 MED ORDER — ONDANSETRON 4 MG PO TBDP
4.0000 mg | ORAL_TABLET | Freq: Three times a day (TID) | ORAL | Status: DC | PRN
  Administered 2017-06-29: 4 mg via ORAL
  Filled 2017-06-29: qty 1

## 2017-06-29 NOTE — Progress Notes (Signed)
Admit Date: 06/27/2017 Today's Date: 06/29/2017  Subjective: Postpartum Day 2: Cesarean Delivery Patient reports nausea, incisional pain and no problems voiding.   Pain is mild.  Nausea and vomiting this am, now feels better w appetite.  No flatus yet.  Objective: Vital signs in last 24 hours: Temp:  [97.7 F (36.5 C)-98.5 F (36.9 C)] 97.7 F (36.5 C) (02/09 0804) Pulse Rate:  [68-80] 78 (02/09 0804) Resp:  [18-20] 18 (02/09 0804) BP: (108-130)/(63-84) 130/84 (02/09 0804) SpO2:  [99 %-100 %] 100 % (02/09 0804)  Physical Exam:  General: alert, cooperative and no distress Lochia: appropriate Uterine Fundus: firm Incision: no significant drainage, no dehiscence, no significant erythema DVT Evaluation: No evidence of DVT seen on physical exam. Negative Homan's sign.  Recent Labs    06/26/17 1133 06/28/17 0413  HGB 10.8* 9.6*  HCT 34.7* 30.2*    Assessment/Plan: Status post Cesarean section. Doing well postoperatively.  Continue current care. Advance diet and monitor for nausea Analgesia w Percocet, Tylenol.  Unable to take NSAIDs. Iron and diet for anemia Remove bandage Cont OnQ Pain Pump Breast feeding with diffuclty, cont lactation support Consider d/c planning tomorrow  Deborah House 06/29/2017, 9:50 AM

## 2017-06-30 MED ORDER — DOCUSATE SODIUM 100 MG PO CAPS: 100.0000 mg | ORAL_CAPSULE | Freq: Two times a day (BID) | ORAL | Status: DC

## 2017-06-30 MED ORDER — OXYCODONE-ACETAMINOPHEN 5-325 MG PO TABS: 1.0000 | ORAL_TABLET | Freq: Four times a day (QID) | ORAL | 0 refills | Status: DC | PRN

## 2017-06-30 NOTE — Progress Notes (Signed)
PT has decided to keep On Q pump and remove at home.  DC instructions reviewed with pt and FOB.  Verb u/o

## 2017-06-30 NOTE — Discharge Summary (Signed)
OB Discharge Summary     Patient Name: Deborah House DOB: 06/02/1990 MRN: [id number]  Date of admission: 06/27/2017 Delivering MD: Delice Lesch  Date of Delivery: 06/27/2017  Date of discharge: 06/30/2017  Admitting diagnosis: LOW LYING PLACENTA, Term Pregnancy delivered by Cesarean Section Intrauterine pregnancy: [redacted]w[redacted]d     Secondary diagnosis: None     Discharge diagnosis: Term Pregnancy Delivered and low lying placenta, Reasons for cesarean section  Other  low lying placenta                         Hospital course:  Sceduled C/S   27 y.o. yo G2P1011 at [redacted]w[redacted]d was admitted to the hospital 06/27/2017 for scheduled cesarean section with the following indication:low lying placenta.  Membrane Rupture Time/Date:   ,    Patient delivered a Viable infant.06/27/2017  Details of operation can be found in separate operative note.  Pateint had an uncomplicated postpartum course.  She is ambulating, tolerating a regular diet, passing flatus, and urinating well. Patient is discharged home in stable condition on  06/30/17                                                                        Post partum procedures:none  Complications: None  Physical exam on 06/30/2017: Vitals:   06/29/17 0804 06/29/17 1617 06/29/17 2351 06/30/17 0810  BP: 130/84 118/71 128/71 123/77  Pulse: 78 81 77 64  Resp: 18 17 20 18   Temp: 97.7 F (36.5 C)  98 F (36.7 C) 97.9 F (36.6 C)  TempSrc: Oral  Oral Oral  SpO2: 100% 100% 100% 100%  Weight:      Height:       General: alert, cooperative and no distress Lochia: appropriate Uterine Fundus: firm Incision: Healing well with no significant drainage DVT Evaluation: No evidence of DVT seen on physical exam. Negative Homan's sign.  Labs: Lab Results  Component Value Date   WBC 17.5 (H) 06/28/2017   HGB 9.6 (L) 06/28/2017   HCT 30.2 (L) 06/28/2017   MCV 76.8 (L) 06/28/2017   PLT 231 06/28/2017   CMP Latest Ref Rng & Units 12/17/2016  Glucose 65 - 99  mg/dL 409(W)  BUN 6 - 20 mg/dL 7  Creatinine 1.19 - 1.47 mg/dL 8.29  Sodium 562 - 130 mmol/L 137  Potassium 3.5 - 5.1 mmol/L 3.7  Chloride 101 - 111 mmol/L 107  CO2 22 - 32 mmol/L 22  Calcium 8.9 - 10.3 mg/dL 9.1  Total Protein 6.5 - 8.1 g/dL 7.3  Total Bilirubin 0.3 - 1.2 mg/dL 0.4  Alkaline Phos 38 - 126 U/L 54  AST 15 - 41 U/L 22  ALT 14 - 54 U/L 11(L)    Discharge instruction: per After Visit Summary.  Medications:  Allergies as of 06/30/2017      Reactions   Ibuprofen Swelling   Suspected--unsure if it is a TRUE allergy.      Medication List    STOP taking these medications   Doxylamine-Pyridoxine 10-10 MG Tbec Commonly known as:  DICLEGIS   terconazole 0.4 % vaginal cream Commonly known as:  TERAZOL 7     TAKE these medications   calcium carbonate 500 MG chewable  tablet Commonly known as:  TUMS - dosed in mg elemental calcium Chew 1-2 tablets by mouth daily as needed for indigestion or heartburn.   nystatin cream Commonly known as:  MYCOSTATIN Apply 1 application 2 (two) times daily topically. What changed:    when to take this  reasons to take this   oxyCODONE-acetaminophen 5-325 MG tablet Commonly known as:  PERCOCET/ROXICET Take 1-2 tablets by mouth every 6 (six) hours as needed.   prenatal multivitamin Tabs tablet Take 1 tablet by mouth daily.     Colace twice daily for stool softener  Diet: routine diet  Activity: Advance as tolerated. Pelvic rest for 6 weeks.   Outpatient follow up: Follow-up Information    Vena AustriaStaebler, Andreas, MD Follow up in 1 week(s).   Specialty:  Obstetrics and Gynecology Why:  For wound re-check Contact information: [address] [place] [address] [phone]             Postpartum contraception: Undecided Rhogam Given postpartum: yes Rubella vaccine given postpartum: no Varicella vaccine given postpartum: no TDaP given antepartum or postpartum: Yes  Newborn Data: Live born female  Birth  Weight: 7 lb 10.4 oz (3470 g) APGAR: 8, 9  Newborn Delivery   Birth date/time:  06/27/2017 08:21:00 Delivery type:  C-Section, Low Transverse C-section categorization:  Primary      Baby Feeding: Bottle and Breast  Disposition:home with mother  SIGNED: Letitia Libraobert Paul Durene Dodge, MD 06/30/2017 10:37 AM

## 2017-06-30 NOTE — Progress Notes (Signed)
Discharge instr reviewed with pt. Verb u/o 

## 2017-06-30 NOTE — Discharge Instructions (Signed)

## 2017-06-30 NOTE — Progress Notes (Signed)
Discharge to home; to car via auxillary 

## 2017-07-02 ENCOUNTER — Ambulatory Visit (INDEPENDENT_AMBULATORY_CARE_PROVIDER_SITE_OTHER): Payer: BLUE CROSS/BLUE SHIELD | Admitting: Obstetrics and Gynecology

## 2017-07-02 ENCOUNTER — Encounter: Payer: Self-pay | Admitting: Obstetrics and Gynecology

## 2017-07-02 VITALS — BP 116/70 | HR 93 | Wt 212.0 lb

## 2017-07-02 DIAGNOSIS — Z4889 Encounter for other specified surgical aftercare: Secondary | ICD-10-CM

## 2017-07-02 NOTE — Progress Notes (Signed)
      Postoperative Follow-up Patient presents post op from 1LTCS 1weeks ago for low lying placenta.  Subjective: Patient reports marked improvement in her preop symptoms. Eating a regular diet without difficulty. Pain is controlled with current analgesics. Medications being used: acetaminophen.  Activity: normal activities of daily living.  Objective: Blood pressure 116/70, pulse 93, weight 212 lb (96.2 kg), last menstrual period 09/20/2016, not currently breastfeeding.  Gen: NAD HEENT: normocephalic, anicteric Pulmonary: no increased work of breathing Abdomen: soft, non-tender, non-distended, incision D/C/I Ext: no edema  Admission on 06/27/2017, Discharged on 06/30/2017  Component Date Value Ref Range Status  . ABO/RH(D) 06/27/2017    Final                   Value:O NEG Performed at [facility], [address] [place], [place] [address]   . Unit Number 06/28/2017 [id number]   Final  . Blood Component Type 06/28/2017 RHIG   Final  . Unit division 06/28/2017 00   Final  . Status of Unit 06/28/2017 ISSUED,FINAL   Final  . Transfusion Status 06/28/2017    Final                   Value:OK TO TRANSFUSE Performed at [facility], [address] [place], [place] [address]   . WBC 06/28/2017 17.5* 3.6 - 11.0 K/uL Final  . RBC 06/28/2017 3.93  3.80 - 5.20 MIL/uL Final  . Hemoglobin 06/28/2017 9.6* 12.0 - 16.0 g/dL Final  . HCT 56/21/308602/12/2017 30.2* 35.0 - 47.0 % Final  . MCV 06/28/2017 76.8* 80.0 - 100.0 fL Final  . MCH 06/28/2017 24.5* 26.0 - 34.0 pg Final  . MCHC 06/28/2017 31.9* 32.0 - 36.0 g/dL Final  . RDW 57/84/696202/12/2017 26.6* 11.5 - 14.5 % Final  . Platelets 06/28/2017 231  150 - 440 K/uL Final   Performed at [facility], [address] [place], [place] [address]  . Fetal Screen 06/28/2017    Final                   Value:NEG Performed at [facility], [address] [place], [place] [address]     Assessment: 27 y.o. s/p 1LTCS  stable  Plan: Patient has done well after surgery with no apparent complications.  I have discussed the post-operative course to date, and the expected progress moving forward.  The patient understands what complications to be concerned about.  I will see the patient in routine follow up, or sooner if needed.    Activity plan: No restriction.  Pap 11/07/2016 normal  Contraception: condoms  Vena AustriaAndreas Brice Potteiger, MD, Merlinda FrederickFACOG [facility], [facility] 07/02/2017, 9:16 AM

## 2017-07-03 ENCOUNTER — Ambulatory Visit: Payer: BLUE CROSS/BLUE SHIELD | Admitting: Obstetrics and Gynecology

## 2017-07-03 ENCOUNTER — Telehealth: Payer: Self-pay

## 2017-07-03 NOTE — Telephone Encounter (Signed)
Pt's STD ins calling to confirm pt delivered by c/s on 2/7th.  Confirmed.

## 2017-07-24 NOTE — Telephone Encounter (Signed)
This encounter was created in error - please disregard.

## 2017-08-07 ENCOUNTER — Encounter: Payer: Self-pay | Admitting: Obstetrics and Gynecology

## 2017-08-07 ENCOUNTER — Ambulatory Visit (INDEPENDENT_AMBULATORY_CARE_PROVIDER_SITE_OTHER): Payer: BLUE CROSS/BLUE SHIELD | Admitting: Obstetrics and Gynecology

## 2017-08-07 NOTE — Progress Notes (Signed)
Postpartum Visit  Chief Complaint:  Chief Complaint  Patient presents with  . Postpartum Care    C/S 2/7    History of Present Illness: Patient is a 27 y.o. G2P1011 presents for postpartum visit.  Date of delivery: 06/23/2015 Cesarean Section: low lying placenta Pregnancy or labor problems:  no Any problems since the delivery:  no  Newborn Details:  SINGLETON :  1. Baby's Gender: female. Birth weight:7lbs 10oz Maternal Details:  Breast Feeding:  No Post partum depression/anxiety noted:  no Edinburgh Post-Partum Depression Score:  1  Date of last PAP: 07/20/2015  no abnormalities   Review of Systems: Review of Systems  Constitutional: Negative for chills and fever.  HENT: Negative for congestion.   Respiratory: Negative for cough and shortness of breath.   Cardiovascular: Negative for chest pain and palpitations.  Gastrointestinal: Negative for abdominal pain, constipation, diarrhea, heartburn, nausea and vomiting.  Genitourinary: Negative for dysuria, frequency and urgency.  Skin: Negative for itching and rash.  Neurological: Negative for dizziness and headaches.  Endo/Heme/Allergies: Negative for polydipsia.  Psychiatric/Behavioral: Negative for depression.    The following portions of the patient's history were reviewed and updated as appropriate: allergies, current medications, past family history, past medical history, past social history, past surgical history and problem list.  Past Medical History:  Past Medical History:  Diagnosis Date  . Abdominal pain   . Amenorrhea   . Anemia   . Chlamydia 2012   tx'd  . Family history of ovarian cancer 07/2015   genetic testing letter sent  . Hemorrhoids   . Low-lying placenta in third trimester   . [facility] abortion 06[facility]01/2014   [facility]    Past Surgical History:  Past Surgical History:  Procedure Laterality Date  . CESAREAN SECTION N/A 06/27/2017   Procedure: CESAREAN SECTION;  Surgeon: Vena AustriaStaebler, Demontay Grantham, MD;   Location: [facility] ORS;  Service: Obstetrics;  Laterality: N/A;  . DILATION AND CURETTAGE OF UTERUS  06/01/2014  . EYE SURGERY Left    when an infant    Family History:  Family History  Problem Relation Age of Onset  . Diabetes Maternal Grandmother   . Ovarian cancer Maternal Grandmother 60  . Anemia Mother   . Anemia Sister     Social History:  Social History   Socioeconomic History  . Marital status: Married    Spouse name: Not on file  . Number of children: 0  . Years of education: 4912  . Highest education level: Not on file  Social Needs  . Financial resource strain: Not on file  . Food insecurity - worry: Not on file  . Food insecurity - inability: Not on file  . Transportation needs - medical: Not on file  . Transportation needs - non-medical: Not on file  Occupational History  . Occupation: RETAIL SALES  Tobacco Use  . Smoking status: Never Smoker  . Smokeless tobacco: Never Used  Substance and Sexual Activity  . Alcohol use: No  . Drug use: No  . Sexual activity: Yes  Other Topics Concern  . Not on file  Social History Narrative  . Not on file    Allergies:  Allergies  Allergen Reactions  . Ibuprofen Swelling    Suspected--unsure if it is a TRUE allergy.    Medications: Prior to Admission medications   Medication Sig Start Date End Date Taking? Authorizing Provider  nystatin cream (MYCOSTATIN) Apply 1 application 2 (two) times daily topically. Patient taking differently: Apply 1 application topically  2 (two) times daily as needed (FOR DRY/IRRITATED SKIN.).  04/05/17  Yes Oswaldo Conroy, CNM    Physical Exam Blood pressure 124/80, pulse 68, weight 207 lb (93.9 kg), last menstrual period 08/03/2017, not currently breastfeeding.  General: NAD HEENT: normocephalic, anicteric Pulmonary: No increased work of breathing Abdomen: NABS, soft, non-tender, non-distended.  Umbilicus without lesions.  No hepatomegaly, splenomegaly or masses palpable. No  evidence of hernia. Incision D/C/I Genitourinary:  External: Normal external female genitalia.  Normal urethral meatus, normal  Bartholin's and Skene's glands.    Vagina: Normal vaginal mucosa, no evidence of prolapse.    Cervix: Grossly normal in appearance, no bleeding  Uterus: Non-enlarged, mobile, normal contour.  No CMT  Adnexa: ovaries non-enlarged, no adnexal masses  Rectal: deferred Extremities: no edema, erythema, or tenderness Neurologic: Grossly intact Psychiatric: mood appropriate, affect full  Edinburgh Postnatal Depression Scale - 08/07/17 1401      Edinburgh Postnatal Depression Scale:  In the Past 7 Days   I have been able to laugh and see the funny side of things.  0    I have looked forward with enjoyment to things.  0    I have blamed myself unnecessarily when things went wrong.  0    I have been anxious or worried for no good reason.  0    I have felt scared or panicky for no good reason.  0    Things have been getting on top of me.  1    I have been so unhappy that I have had difficulty sleeping.  0    I have felt sad or miserable.  0    I have been so unhappy that I have been crying.  0    The thought of harming myself has occurred to me.  0    Edinburgh Postnatal Depression Scale Total  1       Assessment: 27 y.o. G2P1011 presenting for 6 week postpartum visit  Plan: Problem List Items Addressed This Visit    None    Visit Diagnoses    Encounter for postpartum visit    -  Primary      1) Contraception - Education given regarding options for contraception, as well as compatibility with breast feeding if applicable.  Patient plans on condoms for contraception.  2)  Pap - ASCCP guidelines and rational discussed.  ASCCP guidelines and rational discussed.  Patient opts for every 3 years screening interval  3) Patient underwent screening for postpartum depression with no signs of depression  4) Return in about 1 year (around 08/08/2018) for  annual.   [name], MD, [facility], [facility] 08/07/2017, 2:05 PM

## 2017-09-30 NOTE — Progress Notes (Deleted)
[facility]  Telephone:([phone] Fax:(336) [phone]  ID: Deborah House OB: November 27, 1990  MR#: [id number]  AOZ#:[id number]  Patient Care Team: Patient, No Pcp Per as PCP - General (General Practice)  CHIEF COMPLAINT: Anemia affecting pregnancy in the third trimester.  INTERVAL HISTORY: Patient is a 27 year old female in the third trimester of her first pregnancy who was noted to have a declining hemoglobin and iron stores and routine blood work.  She is having a scheduled C-section on June 27, 2017.  Currently, she feels well and is asymptomatic.  She has no neurologic complaints.  She denies any recent fevers or illnesses.  She is gaining weight appropriately.  She has no chest pain or shortness of breath.  She denies any nausea, vomiting, constipation, or diarrhea.  She has no melena or hematochezia.  She has no urinary complaints.  Patient offers no specific complaints today.  REVIEW OF SYSTEMS:   Review of Systems  Constitutional: Negative.  Negative for fever, malaise/fatigue and weight loss.  Respiratory: Negative.  Negative for cough and shortness of breath.   Cardiovascular: Negative.  Negative for chest pain and leg swelling.  Gastrointestinal: Negative.  Negative for abdominal pain, blood in stool and melena.  Genitourinary: Negative.  Negative for hematuria.  Musculoskeletal: Negative.   Skin: Negative.  Negative for rash.  Neurological: Negative.  Negative for sensory change and weakness.  Psychiatric/Behavioral: Negative.  The patient is not nervous/anxious.     As per HPI. Otherwise, a complete review of systems is negative.  PAST MEDICAL HISTORY: Past Medical History:  Diagnosis Date  . Abdominal pain   . Amenorrhea   . Anemia   . Chlamydia 2012   tx'd  . Family history of ovarian cancer 07/2015   genetic testing letter sent  . Hemorrhoids   . Low-lying placenta in third trimester   . Missed abortion 06/01/2014   [facility]    PAST  SURGICAL HISTORY: Past Surgical History:  Procedure Laterality Date  . CESAREAN SECTION N/A 06/27/2017   Procedure: CESAREAN SECTION;  Surgeon: [name], MD;  Location: [facility] ORS;  Service: Obstetrics;  Laterality: N/A;  . DILATION AND CURETTAGE OF UTERUS  06/01/2014  . EYE SURGERY Left    when an infant    FAMILY HISTORY: Family History  Problem Relation Age of Onset  . Diabetes Maternal Grandmother   . Ovarian cancer Maternal Grandmother 60  . Anemia Mother   . Anemia Sister     ADVANCED DIRECTIVES (Y/N):  N  HEALTH MAINTENANCE: Social History   Tobacco Use  . Smoking status: Never Smoker  . Smokeless tobacco: Never Used  Substance Use Topics  . Alcohol use: No  . Drug use: No     Colonoscopy:  PAP:  Bone density:  Lipid panel:  Allergies  Allergen Reactions  . Ibuprofen Swelling    Suspected--unsure if it is a TRUE allergy.    Current Outpatient Medications  Medication Sig Dispense Refill  . nystatin cream (MYCOSTATIN) Apply 1 application 2 (two) times daily topically. (Patient taking differently: Apply 1 application topically 2 (two) times daily as needed (FOR DRY/IRRITATED SKIN.). ) 30 g 1   No current facility-administered medications for this visit.     OBJECTIVE: There were no vitals filed for this visit.   There is no height or weight on file to calculate BMI.    ECOG FS:0 - Asymptomatic  General: Well-developed, well-nourished, no acute distress. Eyes: Pink conjunctiva, anicteric sclera. HEENT: Normocephalic, moist mucous membranes,  clear oropharnyx. Lungs: Clear to auscultation bilaterally. Heart: Regular rate and rhythm. No rubs, murmurs, or gallops. Abdomen: Appears appropriate for gestational age. Musculoskeletal: No edema, cyanosis, or clubbing. Neuro: Alert, answering all questions appropriately. Cranial nerves grossly intact. Skin: No rashes or petechiae noted. Psych: Normal affect. Lymphatics: No cervical, calvicular, axillary or  inguinal LAD.   LAB RESULTS:  Lab Results  Component Value Date   NA 137 12/17/2016   K 3.7 12/17/2016   CL 107 12/17/2016   CO2 22 12/17/2016   GLUCOSE 102 (H) 12/17/2016   BUN 7 12/17/2016   CREATININE 0.61 12/17/2016   CALCIUM 9.1 12/17/2016   PROT 7.3 12/17/2016   ALBUMIN 3.8 12/17/2016   AST 22 12/17/2016   ALT 11 (L) 12/17/2016   ALKPHOS 54 12/17/2016   BILITOT 0.4 12/17/2016   GFRNONAA >60 12/17/2016   GFRAA >60 12/17/2016    Lab Results  Component Value Date   WBC 17.5 (H) 06/28/2017   NEUTROABS 7.5 (H) 05/27/2017   HGB 9.6 (L) 06/28/2017   HCT 30.2 (L) 06/28/2017   MCV 76.8 (L) 06/28/2017   PLT 231 06/28/2017   Lab Results  Component Value Date   IRON 24 (L) 05/27/2017   TIBC 617 (H) 05/27/2017   IRONPCTSAT 4 (L) 05/27/2017    Lab Results  Component Value Date   FERRITIN 6 (L) 05/27/2017     STUDIES: No results found.  ASSESSMENT: Anemia affecting pregnancy in the third trimester.  PLAN:  1. Anemia affecting pregnancy in the third trimester: Patient's hemoglobin is trending down and is now below 8.0.  Iron stores are pending at time of dictation.  She reports she is not taking oral iron supplementation.  Patient will benefit from IV iron and will return later this week to receive 510 mg IV Feraheme.  She will then return next week for a second infusion.  Patient will return to clinic in mid May, approximately 3 months postpartum, for further evaluation, repeat laboratory work, and consideration of additional IV iron if necessary. 2.  Decreased MCV: Hemoglobinopathy profile was previously reported as normal. 3.  Pregnancy: Patient reports a scheduled C-section on June 27, 2017.  Approximately 45 minutes was spent in discussion of which greater than 50% was consultation.  Patient expressed understanding and was in agreement with this plan. She also understands that She can call clinic at any time with any questions, concerns, or complaints.     Jeralyn Ruths, MD   09/30/2017 11:21 PM

## 2017-10-02 ENCOUNTER — Inpatient Hospital Stay: Payer: BLUE CROSS/BLUE SHIELD

## 2017-10-02 ENCOUNTER — Inpatient Hospital Stay: Payer: BLUE CROSS/BLUE SHIELD | Admitting: Oncology

## 2017-10-04 ENCOUNTER — Ambulatory Visit (INDEPENDENT_AMBULATORY_CARE_PROVIDER_SITE_OTHER): Payer: BLUE CROSS/BLUE SHIELD | Admitting: Physician Assistant

## 2017-10-04 ENCOUNTER — Encounter: Payer: Self-pay | Admitting: Physician Assistant

## 2017-10-04 VITALS — BP 116/78 | HR 112 | Temp 98.4°F | Resp 16 | Ht 66.0 in | Wt 215.0 lb

## 2017-10-04 DIAGNOSIS — F419 Anxiety disorder, unspecified: Secondary | ICD-10-CM

## 2017-10-04 DIAGNOSIS — O99013 Anemia complicating pregnancy, third trimester: Secondary | ICD-10-CM | POA: Diagnosis not present

## 2017-10-04 NOTE — Progress Notes (Signed)
Patient: Deborah House Female    DOB: 02/18/1991   27 y.o.   MRN: [id number] Visit Date: 10/04/2017  Today's Provider: Trey Sailors, PA-C   Chief Complaint  Patient presents with  . New Patient (Initial Visit)   Subjective:    HPI   Patient comes in today to establish care. Lives in [place] with husband of 71 years, 76 month old girl and one dog. 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   Has been treated for iron deficiency anemia during her pregnancy and is scheduled for another infusion with oncology on 10/15/2017.  She is not currently breast feeding. She does not report issues of depression. She does however report issues regarding anxiety. She reports excessive worry about things that don't concern her. She would like to see a Veterinary surgeon.  She is wondering if there is anything she can do for her energy, asks about B12. Also asks about weight loss following pregnancy. Says she lost some initially, but gained it back. Reports her diet is not great.       Allergies  Allergen Reactions  . Ibuprofen Swelling    Suspected--unsure if it is a TRUE allergy.     Current Outpatient Medications:  .  nystatin cream (MYCOSTATIN), Apply 1 application 2 (two) times daily topically. (Patient not taking: Reported on 10/04/2017), Disp: 30 g, Rfl: 1  Review of Systems  Constitutional: Positive for fatigue.  HENT: Negative.   Eyes: Negative.   Respiratory: Negative.   Cardiovascular: Negative.   Gastrointestinal: Negative.   Endocrine: Negative.   Genitourinary: Negative.   Musculoskeletal: Negative.   Skin: Negative.   Allergic/Immunologic: Negative.   Neurological: Negative.   Hematological: Negative.   Psychiatric/Behavioral: Negative.     Social History   Tobacco Use  . Smoking status: Never Smoker  . Smokeless tobacco: Never Used  Substance Use Topics  . Alcohol use: No    Objective:   BP 116/78 (BP Location: Left Arm, Patient Position: Sitting, Cuff Size: Normal)   Pulse (!) 112   Temp 98.4 F (36.9 C)   Resp 16   Ht  (1.676 m)   Wt 215 lb (97.5 kg)   SpO2 96%   BMI 34.70 kg/m  Vitals:   10/04/17 1508  BP: 116/78  Pulse: (!) 112  Resp: 16  Temp: 98.4 F (36.9 C)  SpO2: 96%  Weight: 215 lb (97.5 kg)  Height:  (1.676 m)     Physical Exam  Constitutional: She appears well-developed and well-nourished.  Cardiovascular: Regular rhythm and normal heart sounds. Tachycardia present.  Pulmonary/Chest: Effort normal and breath sounds normal.  Skin: Skin is warm and dry.  Psychiatric: She has a normal mood and affect. Her behavior is normal.        Assessment & Plan:     1. Anxiety  Refer for counseling.  - Ambulatory referral to Psychiatry  2. Anemia affecting pregnancy in third trimester  Last hemoglobin 06/28/2017 is 9.6. She is scheduled for infusion on 10/15/2017, followed by oncology. Do think her fatigue is combination of anemia, demands of a newborn, and shift work.   Return in about 1 year (around 10/05/2018).  The entirety of the information documented in the History of Present Illness, Review of Systems and Physical Exam were personally obtained by me. Portions of this information were initially documented by Anson Oregon, CMA and reviewed  by me for thoroughness and accuracy.           Trey Sailors, PA-C  [facility]

## 2017-10-04 NOTE — Patient Instructions (Signed)

## 2017-10-11 ENCOUNTER — Other Ambulatory Visit: Payer: Self-pay | Admitting: *Deleted

## 2017-10-11 DIAGNOSIS — D509 Iron deficiency anemia, unspecified: Secondary | ICD-10-CM

## 2017-10-14 NOTE — Progress Notes (Signed)
[facility]  Telephone:(336) [phone] Fax:(336) [phone]  ID: Deborah House OB: June 05, 1990  MR#: [id number]  AOZ#:[id number]  Patient Care Team: Maryella Shivers as PCP - General (Physician Assistant)  CHIEF COMPLAINT: Anemia affecting pregnancy in the third trimester.  INTERVAL HISTORY: Patient returns to clinic today for repeat laboratory work and postpartum follow-up for her iron deficiency anemia.  She underwent C-section on June 27, 2017 without complication.  She is anxious, but otherwise feels well. She has no neurologic complaints.  She denies any recent fevers or illnesses. She has no chest pain or shortness of breath.  She denies any nausea, vomiting, constipation, or diarrhea.  She has no melena or hematochezia.  She has no urinary complaints.  Patient offers no specific complaints today.  REVIEW OF SYSTEMS:   Review of Systems  Constitutional: Negative.  Negative for fever, malaise/fatigue and weight loss.  Respiratory: Negative.  Negative for cough and shortness of breath.   Cardiovascular: Negative.  Negative for chest pain and leg swelling.  Gastrointestinal: Negative.  Negative for abdominal pain, blood in stool and melena.  Genitourinary: Negative.  Negative for hematuria.  Musculoskeletal: Negative.   Skin: Negative.  Negative for rash.  Neurological: Negative.  Negative for sensory change, focal weakness and weakness.  Psychiatric/Behavioral: The patient is nervous/anxious.     As per HPI. Otherwise, a complete review of systems is negative.  PAST MEDICAL HISTORY: Past Medical History:  Diagnosis Date  . Abdominal pain   . Amenorrhea   . Anemia   . Chlamydia 2012   tx'd  . Family history of ovarian cancer 07/2015   genetic testing letter sent  . Hemorrhoids   . Low-lying placenta in third trimester   . Missed abortion 06/01/2014   [facility]    PAST SURGICAL HISTORY: Past Surgical History:  Procedure Laterality Date  .  CESAREAN SECTION N/A 06/27/2017   Procedure: CESAREAN SECTION;  Surgeon: [name], MD;  Location: [facility] ORS;  Service: Obstetrics;  Laterality: N/A;  . DILATION AND CURETTAGE OF UTERUS  06/01/2014  . EYE SURGERY Left    when an infant    FAMILY HISTORY: Family History  Problem Relation Age of Onset  . Diabetes Maternal Grandmother   . Ovarian cancer Maternal Grandmother 60  . Cancer Maternal Grandmother        Ovarian cancer  . Anemia Mother   . Anemia Sister     ADVANCED DIRECTIVES (Y/N):  N  HEALTH MAINTENANCE: Social History   Tobacco Use  . Smoking status: Never Smoker  . Smokeless tobacco: Never Used  Substance Use Topics  . Alcohol use: No  . Drug use: No     Colonoscopy:  PAP:  Bone density:  Lipid panel:  Allergies  Allergen Reactions  . Ibuprofen Swelling    Suspected--unsure if it is a TRUE allergy.    Current Outpatient Medications  Medication Sig Dispense Refill  . nystatin cream (MYCOSTATIN) Apply 1 application 2 (two) times daily topically. (Patient not taking: Reported on 10/04/2017) 30 g 1   No current facility-administered medications for this visit.     OBJECTIVE: Vitals:   10/15/17 1436  BP: 125/83  Pulse: 91  Resp: 18  Temp: (!) 96.6 F (35.9 C)     Body mass index is 34.64 kg/m.    ECOG FS:0 - Asymptomatic  General: Well-developed, well-nourished, no acute distress. Eyes: Pink conjunctiva, anicteric sclera. Lungs: Clear to auscultation bilaterally. Heart: Regular rate and rhythm. No rubs, murmurs,  or gallops. Abdomen: Soft, nontender, nondistended. No organomegaly noted, normoactive bowel sounds. Musculoskeletal: No edema, cyanosis, or clubbing. Neuro: Alert, answering all questions appropriately. Cranial nerves grossly intact. Skin: No rashes or petechiae noted. Psych: Normal affect.  LAB RESULTS:  Lab Results  Component Value Date   NA 137 12/17/2016   K 3.7 12/17/2016   CL 107 12/17/2016   CO2 22 12/17/2016    GLUCOSE 102 (H) 12/17/2016   BUN 7 12/17/2016   CREATININE 0.61 12/17/2016   CALCIUM 9.1 12/17/2016   PROT 7.3 12/17/2016   ALBUMIN 3.8 12/17/2016   AST 22 12/17/2016   ALT 11 (L) 12/17/2016   ALKPHOS 54 12/17/2016   BILITOT 0.4 12/17/2016   GFRNONAA >60 12/17/2016   GFRAA >60 12/17/2016    Lab Results  Component Value Date   WBC 9.3 10/15/2017   NEUTROABS 5.1 10/15/2017   HGB 13.1 10/15/2017   HCT 39.7 10/15/2017   MCV 77.0 (L) 10/15/2017   PLT 297 10/15/2017   Lab Results  Component Value Date   IRON 92 10/15/2017   TIBC 368 10/15/2017   IRONPCTSAT 25 10/15/2017    Lab Results  Component Value Date   FERRITIN 62 10/15/2017     STUDIES: No results found.  ASSESSMENT: Anemia affecting pregnancy in the third trimester.  PLAN:  1. Anemia affecting pregnancy in the third trimester: Patient's hemoglobin and iron stores are now within normal limits.  Patient last received IV Feraheme on May 26, 2017.  No intervention is needed at this time.  Patient does not require additional IV Feraheme.  After discussion with the patient, it was agreed upon that no further follow-up is necessary.  Please refer patient back if there are any questions or concerns.   2.  Decreased MCV: Hemoglobinopathy profile was previously reported as normal. 3.  Pregnancy: By report, patient had an uncomplicated C-section on June 27, 2017.  Approximately 20 minutes was spent in discussion of which greater than 50% was consultation.  Patient expressed understanding and was in agreement with this plan. She also understands that She can call clinic at any time with any questions, concerns, or complaints.    Jeralyn Ruths, MD   10/18/2017 8:21 AM

## 2017-10-15 ENCOUNTER — Inpatient Hospital Stay: Payer: BLUE CROSS/BLUE SHIELD

## 2017-10-15 ENCOUNTER — Other Ambulatory Visit: Payer: Self-pay

## 2017-10-15 ENCOUNTER — Inpatient Hospital Stay (HOSPITAL_BASED_OUTPATIENT_CLINIC_OR_DEPARTMENT_OTHER): Payer: BLUE CROSS/BLUE SHIELD | Admitting: Oncology

## 2017-10-15 ENCOUNTER — Other Ambulatory Visit: Payer: Self-pay | Admitting: Physician Assistant

## 2017-10-15 ENCOUNTER — Inpatient Hospital Stay: Payer: BLUE CROSS/BLUE SHIELD | Attending: Oncology

## 2017-10-15 VITALS — BP 125/83 | HR 91 | Temp 96.6°F | Resp 18 | Wt 214.6 lb

## 2017-10-15 DIAGNOSIS — D509 Iron deficiency anemia, unspecified: Secondary | ICD-10-CM

## 2017-10-15 DIAGNOSIS — O99013 Anemia complicating pregnancy, third trimester: Secondary | ICD-10-CM

## 2017-10-15 DIAGNOSIS — F419 Anxiety disorder, unspecified: Secondary | ICD-10-CM

## 2017-10-15 LAB — CBC WITH DIFFERENTIAL/PLATELET
BASOS ABS: 0 10*3/uL (ref 0–0.1)
Basophils Relative: 0 %
EOS ABS: 0.1 10*3/uL (ref 0–0.7)
Eosinophils Relative: 1 %
HCT: 39.7 % (ref 35.0–47.0)
Hemoglobin: 13.1 g/dL (ref 12.0–16.0)
LYMPHS ABS: 3.5 10*3/uL (ref 1.0–3.6)
Lymphocytes Relative: 37 %
MCH: 25.5 pg — AB (ref 26.0–34.0)
MCHC: 33.1 g/dL (ref 32.0–36.0)
MCV: 77 fL — ABNORMAL LOW (ref 80.0–100.0)
Monocytes Absolute: 0.6 10*3/uL (ref 0.2–0.9)
Monocytes Relative: 7 %
Neutro Abs: 5.1 10*3/uL (ref 1.4–6.5)
Neutrophils Relative %: 55 %
PLATELETS: 297 10*3/uL (ref 150–440)
RBC: 5.15 MIL/uL (ref 3.80–5.20)
RDW: 14.6 % — ABNORMAL HIGH (ref 11.5–14.5)
WBC: 9.3 10*3/uL (ref 3.6–11.0)

## 2017-10-15 LAB — IRON AND TIBC
Iron: 92 ug/dL (ref 28–170)
Saturation Ratios: 25 % (ref 10.4–31.8)
TIBC: 368 ug/dL (ref 250–450)
UIBC: 276 ug/dL

## 2017-10-15 LAB — FERRITIN: FERRITIN: 62 ng/mL (ref 11–307)

## 2017-10-15 NOTE — Progress Notes (Signed)
Here for follow up. Weepy and given [facility] walk in info. Stated that she has appt July 9 @ [facility] w provider [place]. Pt denied suicidal ideation-admitted to feeling overwhelmed w new baby " and everything " . Call to [facility] and pt given walk in hours-agrees to go to walk in as needed. Agrees to go to ER if self harm ideation (which she denies at this time )

## 2017-10-29 ENCOUNTER — Ambulatory Visit (INDEPENDENT_AMBULATORY_CARE_PROVIDER_SITE_OTHER): Payer: BLUE CROSS/BLUE SHIELD | Admitting: Obstetrics and Gynecology

## 2017-10-29 ENCOUNTER — Encounter: Payer: Self-pay | Admitting: Obstetrics and Gynecology

## 2017-10-29 VITALS — BP 110/70 | HR 59 | Ht 66.0 in | Wt 217.0 lb

## 2017-10-29 DIAGNOSIS — N912 Amenorrhea, unspecified: Secondary | ICD-10-CM | POA: Diagnosis not present

## 2017-10-29 DIAGNOSIS — N911 Secondary amenorrhea: Secondary | ICD-10-CM | POA: Diagnosis not present

## 2017-10-29 DIAGNOSIS — Z3202 Encounter for pregnancy test, result negative: Secondary | ICD-10-CM | POA: Diagnosis not present

## 2017-10-29 LAB — POCT URINE PREGNANCY: Preg Test, Ur: NEGATIVE

## 2017-10-29 MED ORDER — MEDROXYPROGESTERONE ACETATE 10 MG PO TABS: 10.0000 mg | ORAL_TABLET | Freq: Every day | ORAL | 0 refills | Status: DC

## 2017-10-29 NOTE — Progress Notes (Signed)
Obstetrics & Gynecology Office Visit   Chief Complaint:  Chief Complaint  Patient presents with  . Amenorrhea    C/S 06/2017, no cycle since 07/2017    History of Present Illness: 27 y.o. G2P1011 presenting for the evaluation of absent menstruation.  The patient report Patient's last menstrual period was 08/03/2017.Marland Kitchen.  Preceding the current episode of amenorrhea, the patient's menstrual cycles had been regular monthly.  The patient is currently sexually active and is using condoms for contraception.  She does not have any contributing past medical history.  The patient has not started any new medications coinciding with the onset of her symptoms.  The patient is not interested in conceiving in the near future.  She does not exercise excessively.  PCOS/Cushings Wt Change: no Hirsutism: no Acne: no Balding: no Lipodystrophy:  no Acanthosis nigricans:  no Striae:  no  Hyperprolactinemia Galactorrhea: no but did have nipple discharge as recently as 2 months ago Headaches: no Vision Changes:  no Prior obstetric hemorrhage: no  Thyroid Temperature Intolerance: no Constipation or Diarrhea: no Hair Thinning:  no Palpitations:  no Outlet Obstruction: Prior D&C: no Prior myomectomy: no Prior LEEP or CKC: no   Review of Systems: Review of Systems  Constitutional: Positive for malaise/fatigue. Negative for chills, diaphoresis, fever and weight loss.  HENT: Negative.   Eyes: Negative.   Respiratory: Negative.   Cardiovascular: Negative.   Gastrointestinal: Negative.   Genitourinary: Negative.   Musculoskeletal: Negative.   Skin: Negative.   Endo/Heme/Allergies: Negative.   Psychiatric/Behavioral: Negative for depression, hallucinations, memory loss, substance abuse and suicidal ideas. The patient is nervous/anxious. The patient does not have insomnia.      Past Medical History:  Past Medical History:  Diagnosis Date  . Abdominal pain   . Amenorrhea   . Anemia   .  Chlamydia 2012   tx'd  . Family history of ovarian cancer 07/2015   genetic testing letter sent  . Hemorrhoids   . Low-lying placenta in third trimester   . [facility]    Past Surgical History:  Past Surgical History:  Procedure Laterality Date  . CESAREAN SECTION N/A 06/27/2017   Procedure: CESAREAN SECTION;  Surgeon: Vena AustriaStaebler, Roshan Salamon, MD;  Location: [facility] ORS;  Service: Obstetrics;  Laterality: N/A;  . DILATION AND CURETTAGE OF UTERUS  06/01/2014  . EYE SURGERY Left    when an infant    Gynecologic History: Patient's last menstrual period was 08/03/2017.  Obstetric History: G2P1011  Family History:  Family History  Problem Relation Age of Onset  . Diabetes Maternal Grandmother   . Ovarian cancer Maternal Grandmother 60  . Cancer Maternal Grandmother        Ovarian cancer  . Anemia Mother   . Anemia Sister     Social History:  Social History   Socioeconomic History  . Marital status: Married    Spouse name: Not on file  . Number of children: 0  . Years of education: 8712  . Highest education level: Not on file  Occupational History  . Occupation: [employer]  Social Needs  . Financial resource strain: Not on file  . Food insecurity:    Worry: Not on file    Inability: Not on file  . Transportation needs:    Medical: Not on file    Non-medical: Not on file  Tobacco Use  . Smoking status: Never Smoker  . Smokeless tobacco: Never Used  Substance and Sexual Activity  .  Alcohol use: No  . Drug use: No  . Sexual activity: Yes  Lifestyle  . Physical activity:    Days per week: Not on file    Minutes per session: Not on file  . Stress: Not on file  Relationships  . Social connections:    Talks on phone: Not on file    Gets together: Not on file    Attends religious service: Not on file    Active member of club or organization: Not on file    Attends meetings of clubs or organizations: Not on file    Relationship status: Not on file    . Intimate partner violence:    Fear of current or ex partner: Not on file    Emotionally abused: Not on file    Physically abused: Not on file    Forced sexual activity: Not on file  Other Topics Concern  . Not on file  Social History Narrative  . Not on file    Allergies:  Allergies  Allergen Reactions  . Ibuprofen Swelling    Suspected--unsure if it is a TRUE allergy.    Medications: Prior to Admission medications   Medication Sig Start Date End Date Taking? Authorizing Provider  medroxyPROGESTERone (PROVERA) 10 MG tablet Take 1 tablet (10 mg total) by mouth daily. Use for ten days 10/29/17   [name], MD    Physical Exam Blood pressure 110/70, pulse (!) 59, height 5\' 6"  (1.676 m), weight 217 lb (98.4 kg), last menstrual period 08/03/2017, not currently breastfeeding. Patient's last menstrual period was 08/03/2017. Body mass index is 35.02 kg/m.  General: NAD HEENT: normocephalic, anicteric Pulmonary: No increased work of breathing Abdomen: NABS, soft, non-tender, non-distended.  Umbilicus without lesions.  No hepatomegaly, splenomegaly or masses palpable. No evidence of hernia C-section scar well healed Extremities: no edema, erythema, or tenderness Neurologic: Grossly intact Psychiatric: mood appropriate, affect full  Female chaperone present for pelvic portion of the physical exam  Assessment: 26 y.o. G2P1011 presenting with secondary amenorrhea  Plan: Problem List Items Addressed This Visit    None    Visit Diagnoses    Amenorrhea    -  Primary   Relevant Orders   POCT urine pregnancy (Completed)   Secondary amenorrhea       Relevant Orders   TSH   Prolactin   Follicle stimulating hormone   Estradiol      1) The risk long term risk of amenorrhea were discussed with the patient.  The role of unopposed estrogen in causing amenorrhea and the possible development of endometrial hyperplasia or carcinoma is discussed.  The risk of endometrial  hyperplasia is linearly correlated with increasing BMI given the production of estrone by adipose tissue.    2) Although amenorrhea is the patients presenting complaint, we discussed that rather than simply addressing her symptom the goal of our work up would be aimed at identifying the underlying cause.  Disruption in the axis of any number of endocrine systems may evidence itself in the form of amenorrhea.  3) Patient will be started on a 10 day provera challenge to assess for the presence of estrogen and rule out outlet obstruction.    4) Interested in [facility] for contraception but would like to ensure nothing underlying that may need to be addressed prior to starting OCP  5) Return in about 1 year (around 10/30/2018) for annual.   [name], MD, [facility], [facility] 10/29/2017, 5:38 PM

## 2017-10-29 NOTE — Progress Notes (Signed)
Secondary amenorrha No HA, vision changes, some galactorrhea as recently as 2 months ago

## 2017-10-30 ENCOUNTER — Encounter (INDEPENDENT_AMBULATORY_CARE_PROVIDER_SITE_OTHER): Payer: Self-pay

## 2017-10-30 LAB — ESTRADIOL: Estradiol: 42.4 pg/mL

## 2017-10-30 LAB — FOLLICLE STIMULATING HORMONE: FSH: 9.8 m[IU]/mL

## 2017-10-30 LAB — TSH: TSH: 1.97 u[IU]/mL (ref 0.450–4.500)

## 2017-10-30 LAB — PROLACTIN: PROLACTIN: 1.6 ng/mL — AB (ref 4.8–23.3)

## 2017-11-26 ENCOUNTER — Ambulatory Visit: Payer: BLUE CROSS/BLUE SHIELD | Admitting: Licensed Clinical Social Worker

## 2017-11-29 DIAGNOSIS — H5203 Hypermetropia, bilateral: Secondary | ICD-10-CM | POA: Diagnosis not present

## 2017-12-20 IMAGING — US US OB TRANSVAGINAL
1 series · 14 of 28 positions shown · non-contrast
Comparison: None.

CLINICAL DATA: Pelvic pain for 1 week

EXAM:
OBSTETRIC <14 WK US AND TRANSVAGINAL OB US
TECHNIQUE: Both transabdominal and transvaginal ultrasound examinations were
performed for complete evaluation of the gestation as well as the
maternal uterus, adnexal regions, and pelvic cul-de-sac.
Transvaginal technique was performed to assess early pregnancy.

[Series 1: us ob transvaginal · 0.22mm/px · 14 of 57 slices shown]
[im 3/57]
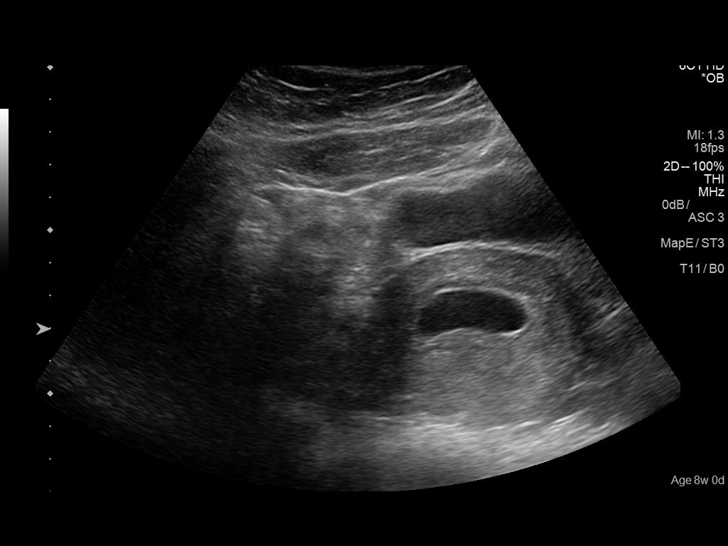
[im 7/57]
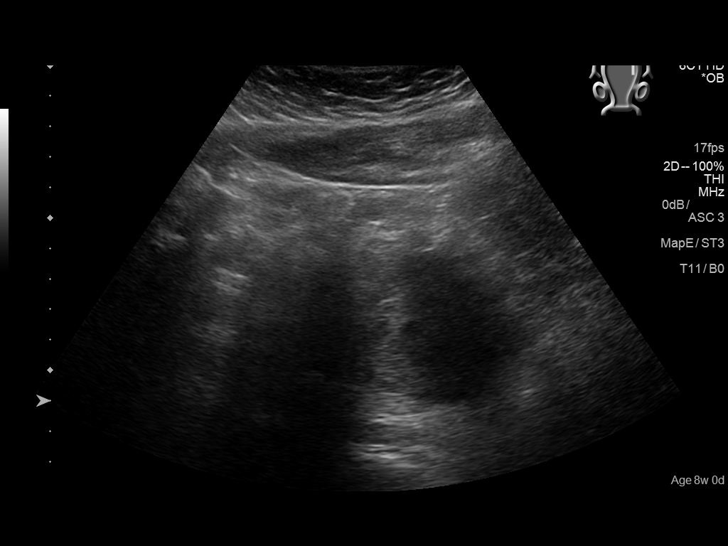
[im 11/57]
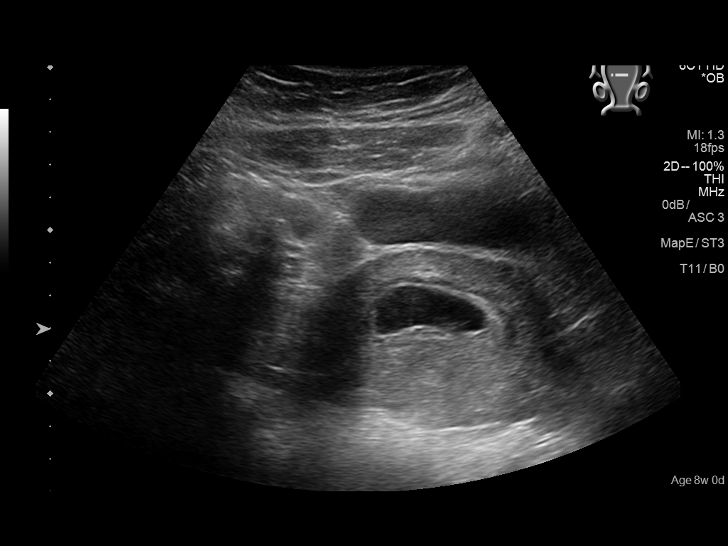
[im 15/57]
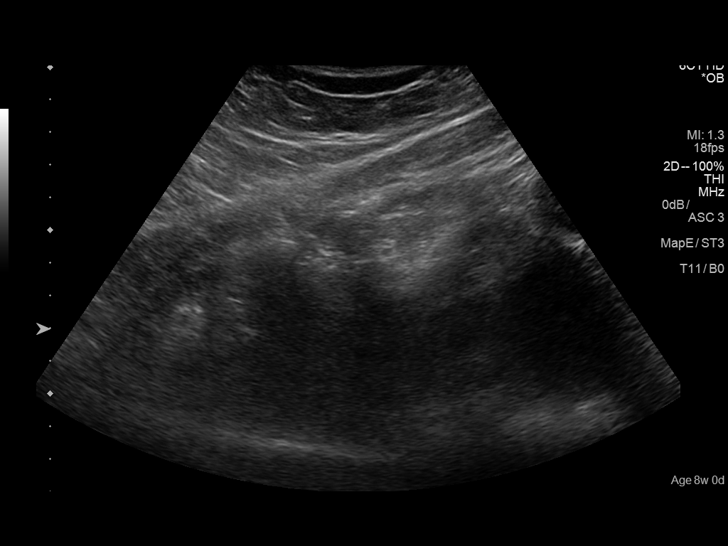
[im 19/57]
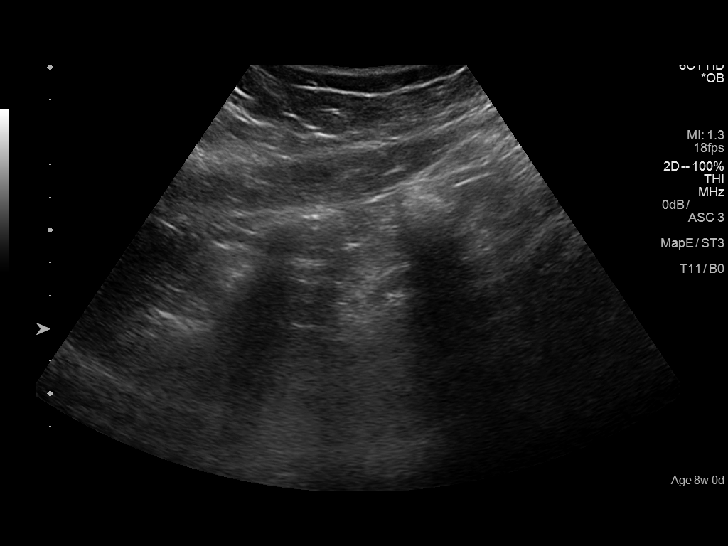
[im 23/57]
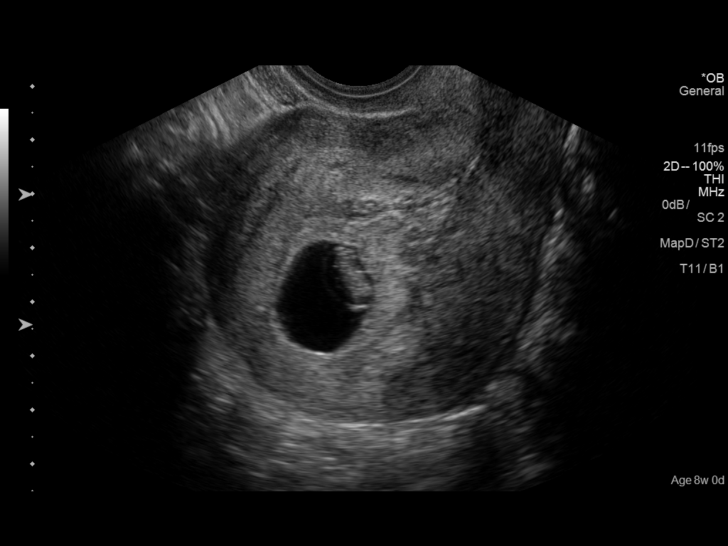
[im 27/57]
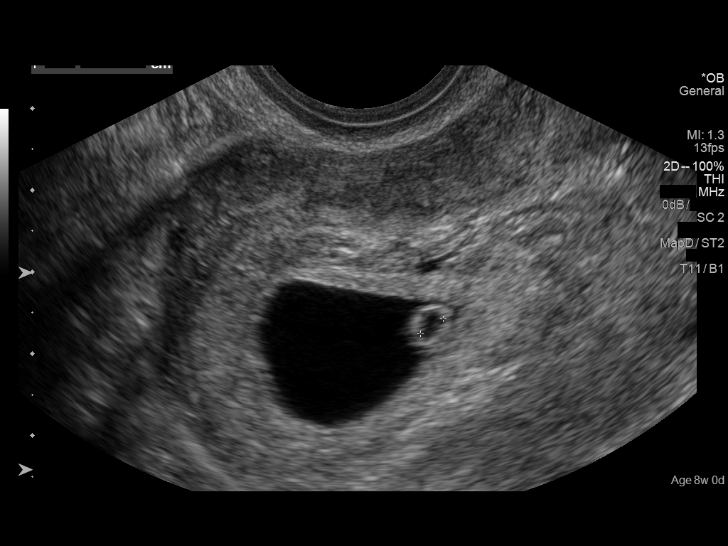
[im 32/57]
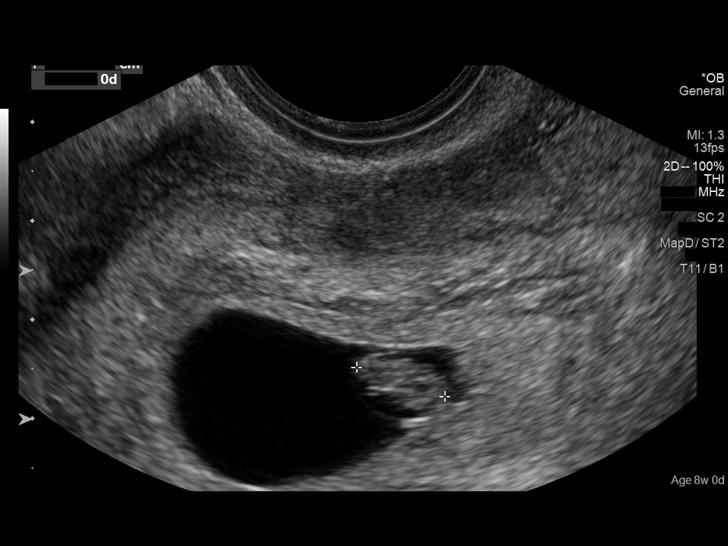
[im 36/57]
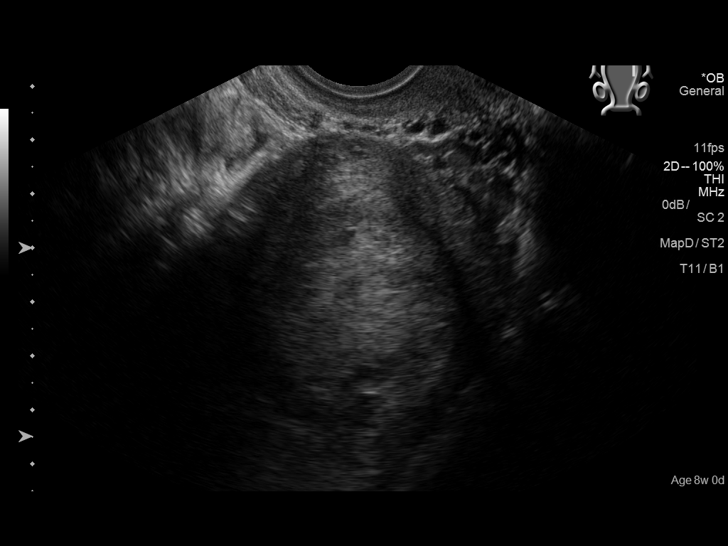
[im 40/57]
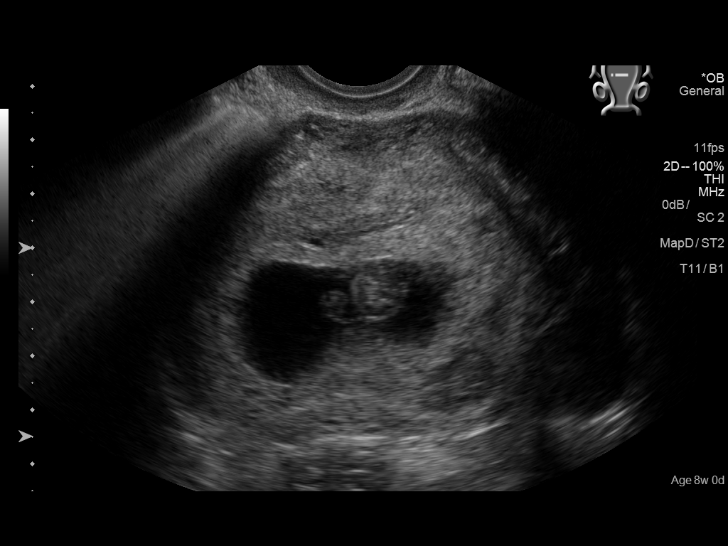
[im 44/57]
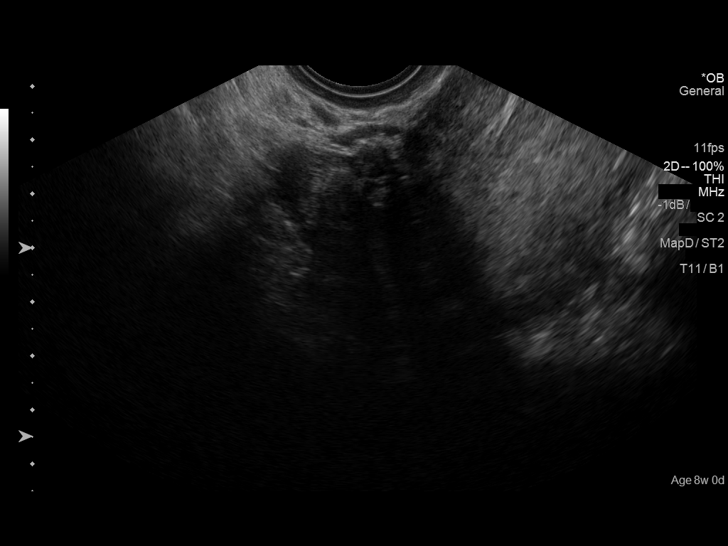
[im 48/57]
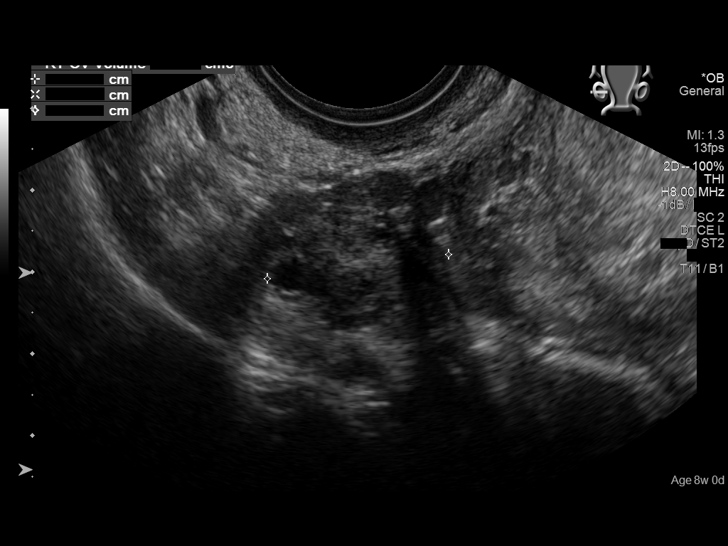
[im 52/57]
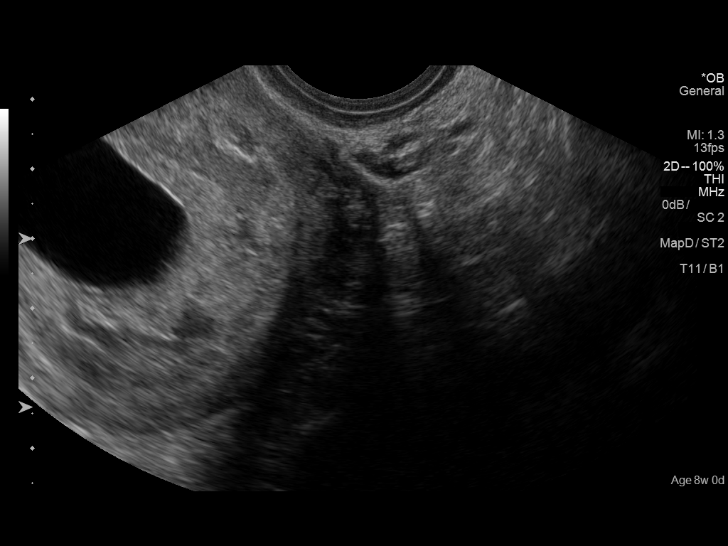
[im 57/57]
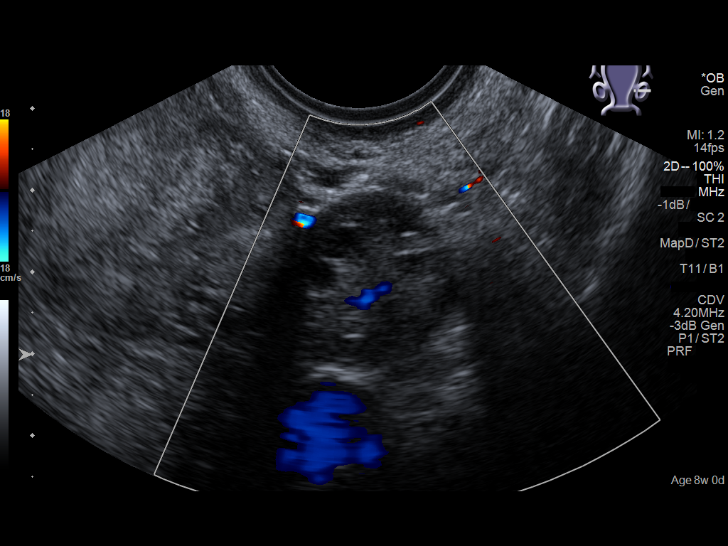

[14 of 28 positions shown; findings below may reference images not displayed]

FINDINGS: Intrauterine gestational sac: Single

Yolk sac:  Visible

Embryo:  Visible

Cardiac Activity: Visible

Heart Rate: 153  bpm

MSD:   mm    w     d

CRL:  9.7  mm   7 w   0 d                  US EDC: 07/05/2017

Subchorionic hemorrhage:  None visualized.

Maternal uterus/adnexae: Normal ovaries. No abnormal pelvic fluid
collections.
IMPRESSION: Single living intrauterine gestation measuring 7 weeks 0 days by
crown-rump length.

## 2018-02-24 ENCOUNTER — Ambulatory Visit: Payer: BLUE CROSS/BLUE SHIELD | Admitting: Obstetrics and Gynecology

## 2018-02-27 ENCOUNTER — Encounter: Payer: Self-pay | Admitting: Obstetrics and Gynecology

## 2018-02-27 ENCOUNTER — Ambulatory Visit (INDEPENDENT_AMBULATORY_CARE_PROVIDER_SITE_OTHER): Payer: BLUE CROSS/BLUE SHIELD | Admitting: Obstetrics and Gynecology

## 2018-02-27 VITALS — BP 128/76 | HR 102 | Ht 66.0 in | Wt 236.0 lb

## 2018-02-27 DIAGNOSIS — N912 Amenorrhea, unspecified: Secondary | ICD-10-CM

## 2018-02-27 DIAGNOSIS — Z23 Encounter for immunization: Secondary | ICD-10-CM | POA: Diagnosis not present

## 2018-02-27 DIAGNOSIS — E669 Obesity, unspecified: Secondary | ICD-10-CM | POA: Diagnosis not present

## 2018-02-27 DIAGNOSIS — Z6838 Body mass index (BMI) 38.0-38.9, adult: Secondary | ICD-10-CM

## 2018-02-27 DIAGNOSIS — Z3202 Encounter for pregnancy test, result negative: Secondary | ICD-10-CM

## 2018-02-27 LAB — POCT URINE PREGNANCY: PREG TEST UR: NEGATIVE

## 2018-02-27 NOTE — Progress Notes (Signed)
Obstetrics & Gynecology Office Visit   Chief Complaint:  Chief Complaint  Patient presents with  . Amenorrhea    Negative home pregnancy test  . Immunizations    History of Present Illness: 27 y.o. G2P1011 presenting for the evaluation of absent menstruation.  The patient report Patient's last menstrual period was 12/24/2017 (exact date)..  Preceding the current episode of amenorrhea, the patient's menstrual cycles had been irregular.  The patient is currently sexually active and is not using contraception.  She does not have any contributing past medical history.  The patient has not started any new medications coinciding with the onset of her symptoms.  The patient is interested in conceiving in the near future.  She does not exercise excessively.  PCOS/Cushings Wt Change: yes, started at 4 years ago (40lbs) Hirsutism: yes Acne: yes Balding: no Lipodystrophy:  no Acanthosis nigricans:  no Striae:  no  Hyperprolactinemia Galactorrhea: no Headaches: no Vision Changes:  no Prior obstetric hemorrhage: no  Thyroid Temperature Intolerance: no Constipation or Diarrhea: no Hair Thinning:  no Palpitations:  no  Outlet Obstruction: Prior D&C: no Prior myomectomy: no Prior LEEP or CKC: no   Review of Systems: Review of Systems  Constitutional: Negative.   Gastrointestinal: Negative.   Genitourinary: Negative.   Skin: Negative.      Past Medical History:  Past Medical History:  Diagnosis Date  . Abdominal pain   . Amenorrhea   . Anemia   . Chlamydia 2012   tx'd  . Family history of ovarian cancer 07/2015   genetic testing letter sent  . Hemorrhoids   . Low-lying placenta in third trimester   . Missed abortion 06/01/2014   [facility]    Past Surgical History:  Past Surgical History:  Procedure Laterality Date  . CESAREAN SECTION N/A 06/27/2017   Procedure: CESAREAN SECTION;  Surgeon: [name], MD;  Location: [facility] ORS;  Service: Obstetrics;   Laterality: N/A;  . DILATION AND CURETTAGE OF UTERUS  06/01/2014  . EYE SURGERY Left    when an infant    Gynecologic History: Patient's last menstrual period was 12/24/2017 (exact date).  Obstetric History: G2P1011  Family History:  Family History  Problem Relation Age of Onset  . Diabetes Maternal Grandmother   . Ovarian cancer Maternal Grandmother 60  . Cancer Maternal Grandmother        Ovarian cancer  . Anemia Mother   . Anemia Sister     Social History:  Social History   Socioeconomic History  . Marital status: Married    Spouse name: Not on file  . Number of children: 0  . Years of education: 67  . Highest education level: Not on file  Occupational History  . Occupation: RETAIL Airline pilot  Social Needs  . Financial resource strain: Not on file  . Food insecurity:    Worry: Not on file    Inability: Not on file  . Transportation needs:    Medical: Not on file    Non-medical: Not on file  Tobacco Use  . Smoking status: Never Smoker  . Smokeless tobacco: Never Used  Substance and Sexual Activity  . Alcohol use: No  . Drug use: No  . Sexual activity: Yes  Lifestyle  . Physical activity:    Days per week: Not on file    Minutes per session: Not on file  . Stress: Not on file  Relationships  . Social connections:    Talks on phone: Not on  file    Gets together: Not on file    Attends religious service: Not on file    Active member of club or organization: Not on file    Attends meetings of clubs or organizations: Not on file    Relationship status: Not on file  . Intimate partner violence:    Fear of current or ex partner: Not on file    Emotionally abused: Not on file    Physically abused: Not on file    Forced sexual activity: Not on file  Other Topics Concern  . Not on file  Social History Narrative  . Not on file    Allergies:  Allergies  Allergen Reactions  . Ibuprofen Swelling    Suspected--unsure if it is a TRUE allergy.     Medications: Prior to Admission medications   Medication Sig Start Date End Date Taking? Authorizing Provider  medroxyPROGESTERone (PROVERA) 10 MG tablet Take 1 tablet (10 mg total) by mouth daily. Use for ten days 10/29/17  Yes [name], MD    Physical Exam Blood pressure 128/76, pulse (!) 102, height 5\' 6"  (1.676 m), weight 236 lb (107 kg), last menstrual period 12/24/2017, not currently breastfeeding. Patient's last menstrual period was 12/24/2017 (exact date). Body mass index is 38.09 kg/m.  40lbs weight gain since 2015  General: NAD HEENT: normocephalic, anicteric Pulmonary: no increased work of breathing Neurologic: Grossly intact Psychiatric: mood appropriate, affect full  Assessment: 27 y.o. G2P1011 presenting with secondary amenorrhea  Plan: Problem List Items Addressed This Visit    None    Visit Diagnoses    Amenorrhea    -  Primary   Relevant Orders   POCT urine pregnancy (Completed)   Flu vaccine need       Relevant Orders   Flu Vaccine QUAD 36+ mos IM (Completed)   Class 2 obesity without serious comorbidity with body mass index (BMI) of 38.0 to 38.9 in adult, unspecified obesity type          1) The risk long term risk of amenorrhea were discussed with the patient.  The role of unopposed estrogen in causing amenorrhea and the possible development of endometrial hyperplasia or carcinoma is discussed.  The risk of endometrial hyperplasia is linearly correlated with increasing BMI given the production of estrone by adipose tissue.    2) Basic lab work up negative.  Given amenorrhea, clinical evidence of elevated androgen (acne, hirsutism).  PCOS is the most likely diagnosis.  We discussed OCP for cycle regulation, ovulation induction for patients attempting to conceive, and weight loss.  Patient is most interested in attempting weight loss.  We discussed options for weight loss management.  The patient will attempt to achieve weight loss on her own  before consider medical weight loss management.  She declines referral to nutritionist  3) Patient did have successful withdrawal bleed on provera  4) Influenza vaccination today  5) A total of 15 minutes were spent in face-to-face contact with the patient during this encounter with over half of that time devoted to counseling and coordination of care.  6) Return in about 2 months (around 04/29/2018) for weight loss follow up.   [name], MD, [facility], [facility] 02/27/2018, 3:26 PM

## 2018-02-27 NOTE — Patient Instructions (Signed)
FAST FACTS . Body Mass Index (BMI) is one measurement that your doctor may use to discuss your weight . BMI is an estimate of body fat. Individuals with a BMI of 25.0-29.9 are considered overweight. Those with a BMI above 30.0 are considered obese . Obesity is a risk factor for many cancers, especially endometrial cancer. In fact, if you are obese, your risk for endometrial cancer may be 10 times higher. . Obesity may affect how your cancer is treated (surgery, chemotherapy, and/or radiation). . If you are overweight or obese, ask your doctor for information about diet and exercise programs.  EXERCISE Here is a list of resources for exercise recommendations and programs that can help you get started. Be sure to look for exercise programs and classes in your neighborhood to get personal support.  American Cancer Society (ACS): Eat Healthy and Get Active www.cancer.org/healthy/eathealthygetactive/ The site provides details about the importance of exercise in cancer prevention as well as resources providing exercise guidelines and tools to set goals and manage physical activity  American Council on Exercise (ACE): Get Fit www.acefitness.org/acefit  This site is full of fitness programs including personalized training workouts and a Engineering geologist of exercise programs. Links to local exercise trainers are provided.  American Heart Association: Getting Healthy - Physical Activity 192837465738 This site provides the American Heart Association guidelines for physical activity, tips for getting started and tips for long term success.   Calorie Counting for Weight Loss Calories are units of energy. Your body needs a certain amount of calories from food to keep you going throughout the day. When you eat more calories than your body needs, your body stores the extra calories as fat. When you eat fewer calories than your  body needs, your body burns fat to get the energy it needs. Calorie counting means keeping track of how many calories you eat and drink each day. Calorie counting can be helpful if you need to lose weight. If you make sure to eat fewer calories than your body needs, you should lose weight. Ask your health care provider what a healthy weight is for you. For calorie counting to work, you will need to eat the right number of calories in a day in order to lose a healthy amount of weight per week. A dietitian can help you determine how many calories you need in a day and will give you suggestions on how to reach your calorie goal.  A healthy amount of weight to lose per week is usually 1-2 lb (0.5-0.9 kg). This usually means that your daily calorie intake should be reduced by 500-750 calories.  Eating 1,200 - 1,500 calories per day can help most women lose weight.  Eating 1,500 - 1,800 calories per day can help most men lose weight.  What is my plan? My goal is to have __________ calories per day. If I have this many calories per day, I should lose around __________ pounds per week. What do I need to know about calorie counting? In order to meet your daily calorie goal, you will need to:  Find out how many calories are in each food you would like to eat. Try to do this before you eat.  Decide how much of the food you plan to eat.  Write down what you ate and how many calories it had. Doing this is called keeping a food log.  To successfully lose weight, it is important to balance calorie counting with a healthy lifestyle that includes regular activity. Aim  for 150 minutes of moderate exercise (such as walking) or 75 minutes of vigorous exercise (such as running) each week. Where do I find calorie information?  The number of calories in a food can be found on a Nutrition Facts label. If a food does not have a Nutrition Facts label, try to look up the calories online or ask your dietitian for  help. Remember that calories are listed per serving. If you choose to have more than one serving of a food, you will have to multiply the calories per serving by the amount of servings you plan to eat. For example, the label on a package of bread might say that a serving size is 1 slice and that there are 90 calories in a serving. If you eat 1 slice, you will have eaten 90 calories. If you eat 2 slices, you will have eaten 180 calories. How do I keep a food log? Immediately after each meal, record the following information in your food log:  What you ate. Don't forget to include toppings, sauces, and other extras on the food.  How much you ate. This can be measured in cups, ounces, or number of items.  How many calories each food and drink had.  The total number of calories in the meal.  Keep your food log near you, such as in a small notebook in your pocket, or use a mobile app or website. Some programs will calculate calories for you and show you how many calories you have left for the day to meet your goal. What are some calorie counting tips?  Use your calories on foods and drinks that will fill you up and not leave you hungry: ? Some examples of foods that fill you up are nuts and nut butters, vegetables, lean proteins, and high-fiber foods like whole grains. High-fiber foods are foods with more than 5 g fiber per serving. ? Drinks such as sodas, specialty coffee drinks, alcohol, and juices have a lot of calories, yet do not fill you up.  Eat nutritious foods and avoid empty calories. Empty calories are calories you get from foods or beverages that do not have many vitamins or protein, such as candy, sweets, and soda. It is better to have a nutritious high-calorie food (such as an avocado) than a food with few nutrients (such as a bag of chips).  Know how many calories are in the foods you eat most often. This will help you calculate calorie counts faster.  Pay attention to calories in  drinks. Low-calorie drinks include water and unsweetened drinks.  Pay attention to nutrition labels for "low fat" or "fat free" foods. These foods sometimes have the same amount of calories or more calories than the full fat versions. They also often have added sugar, starch, or salt, to make up for flavor that was removed with the fat.  Find a way of tracking calories that works for you. Get creative. Try different apps or programs if writing down calories does not work for you. What are some portion control tips?  Know how many calories are in a serving. This will help you know how many servings of a certain food you can have.  Use a measuring cup to measure serving sizes. You could also try weighing out portions on a kitchen scale. With time, you will be able to estimate serving sizes for some foods.  Take some time to put servings of different foods on your favorite plates, bowls, and cups so you  know what a serving looks like.  Try not to eat straight from a bag or box. Doing this can lead to overeating. Put the amount you would like to eat in a cup or on a plate to make sure you are eating the right portion.  Use smaller plates, glasses, and bowls to prevent overeating.  Try not to multitask (for example, watch TV or use your computer) while eating. If it is time to eat, sit down at a table and enjoy your food. This will help you to know when you are full. It will also help you to be aware of what you are eating and how much you are eating. What are tips for following this plan? Reading food labels  Check the calorie count compared to the serving size. The serving size may be smaller than what you are used to eating.  Check the source of the calories. Make sure the food you are eating is high in vitamins and protein and low in saturated and trans fats. Shopping  Read nutrition labels while you shop. This will help you make healthy decisions before you decide to purchase your  food.  Make a grocery list and stick to it. Cooking  Try to cook your favorite foods in a healthier way. For example, try baking instead of frying.  Use low-fat dairy products. Meal planning  Use more fruits and vegetables. Half of your plate should be fruits and vegetables.  Include lean proteins like poultry and fish. How do I count calories when eating out?  Ask for smaller portion sizes.  Consider sharing an entree and sides instead of getting your own entree.  If you get your own entree, eat only half. Ask for a box at the beginning of your meal and put the rest of your entree in it so you are not tempted to eat it.  If calories are listed on the menu, choose the lower calorie options.  Choose dishes that include vegetables, fruits, whole grains, low-fat dairy products, and lean protein.  Choose items that are boiled, broiled, grilled, or steamed. Stay away from items that are buttered, battered, fried, or served with cream sauce. Items labeled "crispy" are usually fried, unless stated otherwise.  Choose water, low-fat milk, unsweetened iced tea, or other drinks without added sugar. If you want an alcoholic beverage, choose a lower calorie option such as a glass of wine or light beer.  Ask for dressings, sauces, and syrups on the side. These are usually high in calories, so you should limit the amount you eat.  If you want a salad, choose a garden salad and ask for grilled meats. Avoid extra toppings like bacon, cheese, or fried items. Ask for the dressing on the side, or ask for olive oil and vinegar or lemon to use as dressing.  Estimate how many servings of a food you are given. For example, a serving of cooked rice is  cup or about the size of half a baseball. Knowing serving sizes will help you be aware of how much food you are eating at restaurants. The list below tells you how big or small some common portion sizes are based on everyday objects: ? 1 oz-4 stacked  dice. ? 3 oz-1 deck of cards. ? 1 tsp-1 die. ? 1 Tbsp- a ping-pong ball. ? 2 Tbsp-1 ping-pong ball. ?  cup- baseball. ? 1 cup-1 baseball. Summary  Calorie counting means keeping track of how many calories you eat and drink each day.  If you eat fewer calories than your body needs, you should lose weight.  A healthy amount of weight to lose per week is usually 1-2 lb (0.5-0.9 kg). This usually means reducing your daily calorie intake by 500-750 calories.  The number of calories in a food can be found on a Nutrition Facts label. If a food does not have a Nutrition Facts label, try to look up the calories online or ask your dietitian for help.  Use your calories on foods and drinks that will fill you up, and not on foods and drinks that will leave you hungry.  Use smaller plates, glasses, and bowls to prevent overeating. This information is not intended to replace advice given to you by your health care provider. Make sure you discuss any questions you have with your health care provider. Document Released: 05/07/2005 Document Revised: 04/06/2016 Document Reviewed: 04/06/2016 Elsevier Interactive Patient Education  2018 [employer]. Exercising to [employer] Exercising can help you to lose weight. In order to lose weight through exercise, you need to do vigorous-intensity exercise. You can tell that you are exercising with vigorous intensity if you are breathing very hard and fast and cannot hold a conversation while exercising. Moderate-intensity exercise helps to maintain your current weight. You can tell that you are exercising at a moderate level if you have a higher heart rate and faster breathing, but you are still able to hold a conversation. How often should I exercise? Choose an activity that you enjoy and set realistic goals. Your health care provider can help you to make an activity plan that works for you. Exercise regularly as directed by your health care provider. This may  include:  Doing resistance training twice each week, such as: ? Push-ups. ? Sit-ups. ? Lifting weights. ? Using resistance bands.  Doing a given intensity of exercise for a given amount of time. Choose from these options: ? 150 minutes of moderate-intensity exercise every week. ? 75 minutes of vigorous-intensity exercise every week. ? A mix of moderate-intensity and vigorous-intensity exercise every week.  Children, pregnant women, people who are out of shape, people who are overweight, and older adults may need to consult a health care provider for individual recommendations. If you have any sort of medical condition, be sure to consult your health care provider before starting a new exercise program. What are some activities that can help me to lose weight?  Walking at a rate of at least 4.5 miles an hour.  Jogging or running at a rate of 5 miles per hour.  Biking at a rate of at least 10 miles per hour.  Lap swimming.  Roller-skating or in-line skating.  Cross-country skiing.  Vigorous competitive sports, such as football, basketball, and soccer.  Jumping rope.  Aerobic dancing. How can I be more active in my day-to-day activities?  Use the stairs instead of the elevator.  Take a walk during your lunch break.  If you drive, park your car farther away from work or school.  If you take public transportation, get off one stop early and walk the rest of the way.  Make all of your phone calls while standing up and walking around.  Get up, stretch, and walk around every 30 minutes throughout the day. What guidelines should I follow while exercising?  Do not exercise so much that you hurt yourself, feel dizzy, or get very short of breath.  Consult your health care provider prior to starting a new exercise program.  Wear  comfortable clothes and shoes with good support.  Drink plenty of water while you exercise to prevent dehydration or heat stroke. Body water is lost  during exercise and must be replaced.  Work out until you breathe faster and your heart beats faster. This information is not intended to replace advice given to you by your health care provider. Make sure you discuss any questions you have with your health care provider. Document Released: 06/09/2010 Document Revised: 10/13/2015 Document Reviewed: 10/08/2013 Elsevier Interactive Patient Education  [employer].

## 2018-04-28 ENCOUNTER — Encounter: Payer: Self-pay | Admitting: Obstetrics and Gynecology

## 2018-04-28 ENCOUNTER — Ambulatory Visit (INDEPENDENT_AMBULATORY_CARE_PROVIDER_SITE_OTHER): Payer: BLUE CROSS/BLUE SHIELD | Admitting: Obstetrics and Gynecology

## 2018-04-28 VITALS — BP 110/64 | HR 86 | Ht 66.0 in | Wt 233.0 lb

## 2018-04-28 DIAGNOSIS — Z6837 Body mass index (BMI) 37.0-37.9, adult: Secondary | ICD-10-CM | POA: Diagnosis not present

## 2018-04-28 DIAGNOSIS — E282 Polycystic ovarian syndrome: Secondary | ICD-10-CM

## 2018-04-28 DIAGNOSIS — E668 Other obesity: Secondary | ICD-10-CM | POA: Diagnosis not present

## 2018-04-28 DIAGNOSIS — E669 Obesity, unspecified: Secondary | ICD-10-CM

## 2018-04-28 MED ORDER — PHENTERMINE HCL 37.5 MG PO TABS: 37.5000 mg | ORAL_TABLET | Freq: Every day | ORAL | 0 refills | Status: DC

## 2018-04-28 NOTE — Progress Notes (Signed)
Gynecology Office Visit  Chief Complaint:  Chief Complaint  Patient presents with  . Follow-up    weight loss check    History of Present Illness: Patientis a 27 y.o. 172P1011 female, who presents for the evaluation of the desire to lose weight. She has lost 3 pounds on her own in the past 2 months but is interested in starting medical weight loss therapy and discuss those options further.  She is interested in persuing medical weight loss also because of recent diagnosis of PCOS. We had previously discussed medical options to achieve weight loss and she interested in pursuing these further.  Review of Systems: 10 point review of systems negative unless otherwise noted in HPI  Past Medical History:  Past Medical History:  Diagnosis Date  . Abdominal pain   . Amenorrhea   . Anemia   . Chlamydia 2012   tx'd  . Family history of ovarian cancer 07/2015   genetic testing letter sent  . Hemorrhoids   . Low-lying placenta in third trimester   . [facility]    Past Surgical History:  Past Surgical History:  Procedure Laterality Date  . CESAREAN SECTION N/A 06/27/2017   Procedure: CESAREAN SECTION;  Surgeon: Vena AustriaStaebler, Koree Schopf, MD;  Location: [facility] ORS;  Service: Obstetrics;  Laterality: N/A;  . DILATION AND CURETTAGE OF UTERUS  06/01/2014  . EYE SURGERY Left    when an infant    Gynecologic History: Patient's last menstrual period was 03/03/2018 (exact date).  Obstetric History: G2P1011  Family History:  Family History  Problem Relation Age of Onset  . Diabetes Maternal Grandmother   . Ovarian cancer Maternal Grandmother 60  . Cancer Maternal Grandmother        Ovarian cancer  . Anemia Mother   . Anemia Sister     Social History:  Social History   Socioeconomic History  . Marital status: Married    Spouse name: Not on file  . Number of children: 0  . Years of education: 8112  . Highest education level: Not on file  Occupational History  .  Occupation: [employer]  Social Needs  . Financial resource strain: Not on file  . Food insecurity:    Worry: Not on file    Inability: Not on file  . Transportation needs:    Medical: Not on file    Non-medical: Not on file  Tobacco Use  . Smoking status: Never Smoker  . Smokeless tobacco: Never Used  Substance and Sexual Activity  . Alcohol use: No  . Drug use: No  . Sexual activity: Yes  Lifestyle  . Physical activity:    Days per week: Not on file    Minutes per session: Not on file  . Stress: Not on file  Relationships  . Social connections:    Talks on phone: Not on file    Gets together: Not on file    Attends religious service: Not on file    Active member of club or organization: Not on file    Attends meetings of clubs or organizations: Not on file    Relationship status: Not on file  . Intimate partner violence:    Fear of current or ex partner: Not on file    Emotionally abused: Not on file    Physically abused: Not on file    Forced sexual activity: Not on file  Other Topics Concern  . Not on file  Social History Narrative  .  Not on file    Allergies:  Allergies  Allergen Reactions  . Ibuprofen Swelling    Suspected--unsure if it is a TRUE allergy.    Medications: Prior to Admission medications   Not on File    Physical Exam Blood pressure 110/64, pulse 86, height 5\' 6"  (1.676 m), weight 233 lb (105.7 kg), last menstrual period 03/03/2018, not currently breastfeeding. Wt Readings from Last 3 Encounters:  04/28/18 233 lb (105.7 kg)  02/27/18 236 lb (107 kg)  10/29/17 217 lb (98.4 kg)  Body mass index is 37.61 kg/m.   General: NAD HEENT: normocephalic, anicteric Thyroid: no enlargement Pulmonary: no increased work of breathing Neurologic: Grossly intact Psychiatric: mood appropriate, affect full  Assessment: 27 y.o. G2P1011 No problem-specific Assessment & Plan notes found for this encounter.   Plan: Problem List Items Addressed  This Visit    None    Visit Diagnoses    Class 2 obesity without serious comorbidity with body mass index (BMI) of 37.0 to 37.9 in adult, unspecified obesity type    -  Primary   Relevant Medications   phentermine (ADIPEX-P) 37.5 MG tablet   PCOS (polycystic ovarian syndrome)          1) 1500 Calorie ADA Diet  2) Patient education given regarding appropriate lifestyle changes for weight loss including: regular physical activity, healthy coping strategies, caloric restriction and healthy eating patterns.  3) Patient will be started on weight loss medication. The risks and benefits and side effects of medication, such as Adipex (Phenteramine) ,  Tenuate (Diethylproprion), Belviq (lorcarsin), Contrave (buproprion/naltrexone), Qsymia (phentermine/topiramate), and Saxenda (liraglutide) is discussed. The pros and cons of suppressing appetite and boosting metabolism is discussed. Risks of tolerence and addiction is discussed for selected agents discussed. Use of medicine will ne short term, such as 3-4 months at a time followed by a period of time off of the medicine to avoid these risks and side effects for Adipex, Qsymia, and Tenuate discussed. Pt to call with any negative side effects and agrees to keep follow up appts.  4) Patient to take medication, with the benefits of appetite suppression and metabolism boost d/w pt, along with the side effects and risk factors of long term use that will be avoided with our use of short bursts of therapy. Rx provided.    5) 15 minutes face-to-face; with counseling/coordination of care > 50 percent of visit related to obesity and ongoing management/treatment   6)  Return in about 4 weeks (around 05/26/2018) for wt check.    [name], MD, [facility], [facility] 04/28/2018, 9:33 AM

## 2018-05-01 ENCOUNTER — Encounter: Payer: Self-pay | Admitting: Obstetrics and Gynecology

## 2018-05-01 NOTE — Telephone Encounter (Signed)
Patient is calling to follow up on note. Patient would like to know if it will be ready tomorrow or if you could send it thru mychart. Please advise

## 2018-05-01 NOTE — Telephone Encounter (Signed)
Not in chart

## 2018-05-26 ENCOUNTER — Ambulatory Visit (INDEPENDENT_AMBULATORY_CARE_PROVIDER_SITE_OTHER): Payer: BLUE CROSS/BLUE SHIELD | Admitting: Obstetrics and Gynecology

## 2018-05-26 ENCOUNTER — Encounter: Payer: Self-pay | Admitting: Obstetrics and Gynecology

## 2018-05-26 VITALS — BP 104/68 | Wt 226.0 lb

## 2018-05-26 DIAGNOSIS — Z6836 Body mass index (BMI) 36.0-36.9, adult: Secondary | ICD-10-CM | POA: Diagnosis not present

## 2018-05-26 DIAGNOSIS — E669 Obesity, unspecified: Secondary | ICD-10-CM | POA: Diagnosis not present

## 2018-05-26 MED ORDER — PHENTERMINE HCL 37.5 MG PO TABS: 37.5000 mg | ORAL_TABLET | Freq: Every day | ORAL | 0 refills | Status: DC

## 2018-05-26 NOTE — Progress Notes (Signed)
Gynecology Office Visit  Chief Complaint:  Chief Complaint  Patient presents with  . Follow-up    weight check    History of Present Illness: Patientis a 28 y.o. G71P1011 female, who presents for the evaluation of the desire to lose weight. She has lost 7 pounds 1 months. The patient states the following symptoms since starting her weight loss therapy: appetite suppression, energy, and weight loss.  The patient also reports no other ill effects. The patient specifically denies heart palpitations, anxiety, and insomnia.    Review of Systems: 10 point review of systems negative unless otherwise noted in HPI  Past Medical History:  Past Medical History:  Diagnosis Date  . Abdominal pain   . Amenorrhea   . Anemia   . Chlamydia 2012   tx'd  . Family history of ovarian cancer 07/2015   genetic testing letter sent  . Hemorrhoids   . Low-lying placenta in third trimester   . Missed abortion 06/01/2014   [facility]    Past Surgical History:  Past Surgical History:  Procedure Laterality Date  . CESAREAN SECTION N/A 06/27/2017   Procedure: CESAREAN SECTION;  Surgeon: [name], MD;  Location: [facility] ORS;  Service: Obstetrics;  Laterality: N/A;  . DILATION AND CURETTAGE OF UTERUS  06/01/2014  . EYE SURGERY Left    when an infant    Gynecologic History: Patient's last menstrual period was 05/22/2018.  Obstetric History: G2P1011  Family History:  Family History  Problem Relation Age of Onset  . Diabetes Maternal Grandmother   . Ovarian cancer Maternal Grandmother 60  . Cancer Maternal Grandmother        Ovarian cancer  . Anemia Mother   . Anemia Sister     Social History:  Social History   Socioeconomic History  . Marital status: Married    Spouse name: Not on file  . Number of children: 0  . Years of education: 27  . Highest education level: Not on file  Occupational History  . Occupation: RETAIL Airline pilot  Social Needs  . Financial resource strain: Not on file    . Food insecurity:    Worry: Not on file    Inability: Not on file  . Transportation needs:    Medical: Not on file    Non-medical: Not on file  Tobacco Use  . Smoking status: Never Smoker  . Smokeless tobacco: Never Used  Substance and Sexual Activity  . Alcohol use: No  . Drug use: No  . Sexual activity: Yes  Lifestyle  . Physical activity:    Days per week: Not on file    Minutes per session: Not on file  . Stress: Not on file  Relationships  . Social connections:    Talks on phone: Not on file    Gets together: Not on file    Attends religious service: Not on file    Active member of club or organization: Not on file    Attends meetings of clubs or organizations: Not on file    Relationship status: Not on file  . Intimate partner violence:    Fear of current or ex partner: Not on file    Emotionally abused: Not on file    Physically abused: Not on file    Forced sexual activity: Not on file  Other Topics Concern  . Not on file  Social History Narrative  . Not on file    Allergies:  Allergies  Allergen Reactions  . Ibuprofen  Swelling    Suspected--unsure if it is a TRUE allergy.    Medications: Prior to Admission medications   Medication Sig Start Date End Date Taking? Authorizing Provider  phentermine (ADIPEX-P) 37.5 MG tablet Take 1 tablet (37.5 mg total) by mouth daily before breakfast. 04/28/18  Yes [name], MD    Physical Exam Blood pressure 104/68, weight 226 lb (102.5 kg), last menstrual period 05/22/2018, not currently breastfeeding. Body mass index is 36.48 kg/m.   Wt Readings from Last 3 Encounters:  05/26/18 226 lb (102.5 kg)  04/28/18 233 lb (105.7 kg)  02/27/18 236 lb (107 kg)    General: NAD HEENT: normocephalic, anicteric Thyroid: no enlargement Pulmonary: no increased work of breathing Neurologic: Grossly intact Psychiatric: mood appropriate, affect full  Assessment: 28 y.o. G2P1011 medical weight loss follow  up  Plan: Problem List Items Addressed This Visit    None    Visit Diagnoses    Class 2 obesity without serious comorbidity with body mass index (BMI) of 36.0 to 36.9 in adult, unspecified obesity type    -  Primary   Relevant Medications   phentermine (ADIPEX-P) 37.5 MG tablet      1) 1500 Calorie ADA Diet  2) Patient education given regarding appropriate lifestyle changes for weight loss including: regular physical activity, healthy coping strategies, caloric restriction and healthy eating patterns.  3) Patient will be started on weight loss medication. The risks and benefits and side effects of medication, such as Adipex (Phenteramine) ,  Tenuate (Diethylproprion), Belviq (lorcarsin), Contrave (buproprion/naltrexone), Qsymia (phentermine/topiramate), and Saxenda (liraglutide) is discussed. The pros and cons of suppressing appetite and boosting metabolism is discussed. Risks of tolerence and addiction is discussed for selected agents discussed. Use of medicine will ne short term, such as 3-4 months at a time followed by a period of time off of the medicine to avoid these risks and side effects for Adipex, Qsymia, and Tenuate discussed. Pt to call with any negative side effects and agrees to keep follow up appts.  4) Patient to take medication, with the benefits of appetite suppression and metabolism boost d/w pt, along with the side effects and risk factors of long term use that will be avoided with our use of short bursts of therapy. Rx provided.    5) 15 minutes face-to-face; with counseling/coordination of care > 50 percent of visit related to obesity and ongoing management/treatment   6) PCOS - did have menses this month  7)  Return in about 4 weeks (around 06/23/2018) for medication follow up.    [name], MD, [facility], [facility] 05/26/2018, 2:19 PM

## 2018-06-23 ENCOUNTER — Encounter: Payer: Self-pay | Admitting: Obstetrics and Gynecology

## 2018-06-23 ENCOUNTER — Ambulatory Visit (INDEPENDENT_AMBULATORY_CARE_PROVIDER_SITE_OTHER): Payer: BLUE CROSS/BLUE SHIELD | Admitting: Obstetrics and Gynecology

## 2018-06-23 VITALS — BP 110/70 | HR 98 | Wt 220.0 lb

## 2018-06-23 DIAGNOSIS — Z6835 Body mass index (BMI) 35.0-35.9, adult: Secondary | ICD-10-CM

## 2018-06-23 DIAGNOSIS — E669 Obesity, unspecified: Secondary | ICD-10-CM

## 2018-06-23 MED ORDER — PHENTERMINE HCL 37.5 MG PO TABS: 37.5000 mg | ORAL_TABLET | Freq: Every day | ORAL | 0 refills | Status: DC

## 2018-06-23 NOTE — Progress Notes (Signed)
Gynecology Office Visit  Chief Complaint:  Chief Complaint  Patient presents with  . Weight Check    History of Present Illness: Patientis a 28 y.o. 502P1011 female, who presents for the evaluation of the desire to lose weight. She has lost 6 pounds 1 months, 13lbs for 2 month course. The patient states the following symptoms since starting her weight loss therapy: appetite suppression, energy, and weight loss.  The patient also reports no other ill effects. The patient specifically denies heart palpitations, anxiety, and insomnia.    Review of Systems: 10 point review of systems negative unless otherwise noted in HPI  Past Medical History:  Past Medical History:  Diagnosis Date  . Abdominal pain   . Amenorrhea   . Anemia   . Chlamydia 2012   tx'd  . Family history of ovarian cancer 07/2015   genetic testing letter sent  . Hemorrhoids   . Low-lying placenta in third trimester   . [facility] abortion 06[facility]01/2014   [facility]    Past Surgical History:  Past Surgical History:  Procedure Laterality Date  . CESAREAN SECTION N/A 06/27/2017   Procedure: CESAREAN SECTION;  Surgeon: Vena AustriaStaebler, Alyne Martinson, MD;  Location: [facility] ORS;  Service: Obstetrics;  Laterality: N/A;  . DILATION AND CURETTAGE OF UTERUS  06/01/2014  . EYE SURGERY Left    when an infant    Gynecologic History: Patient's last menstrual period was 06/23/2018.  Obstetric History: G2P1011  Family History:  Family History  Problem Relation Age of Onset  . Diabetes Maternal Grandmother   . Ovarian cancer Maternal Grandmother 60  . Cancer Maternal Grandmother        Ovarian cancer  . Anemia Mother   . Anemia Sister     Social History:  Social History   Socioeconomic History  . Marital status: Married    Spouse name: Not on file  . Number of children: 0  . Years of education: 3912  . Highest education level: Not on file  Occupational History  . Occupation: [employer] pilot[employer]  Social Needs  . Financial resource strain:  Not on file  . Food insecurity:    Worry: Not on file    Inability: Not on file  . Transportation needs:    Medical: Not on file    Non-medical: Not on file  Tobacco Use  . Smoking status: Never Smoker  . Smokeless tobacco: Never Used  Substance and Sexual Activity  . Alcohol use: No  . Drug use: No  . Sexual activity: Yes  Lifestyle  . Physical activity:    Days per week: Not on file    Minutes per session: Not on file  . Stress: Not on file  Relationships  . Social connections:    Talks on phone: Not on file    Gets together: Not on file    Attends religious service: Not on file    Active member of club or organization: Not on file    Attends meetings of clubs or organizations: Not on file    Relationship status: Not on file  . Intimate partner violence:    Fear of current or ex partner: Not on file    Emotionally abused: Not on file    Physically abused: Not on file    Forced sexual activity: Not on file  Other Topics Concern  . Not on file  Social History Narrative  . Not on file    Allergies:  Allergies  Allergen Reactions  . Ibuprofen  Swelling    Suspected--unsure if it is a TRUE allergy.    Medications: Prior to Admission medications   Medication Sig Start Date End Date Taking? Authorizing Provider  phentermine (ADIPEX-P) 37.5 MG tablet Take 1 tablet (37.5 mg total) by mouth daily before breakfast. 05/26/18  Yes [name], MD    Physical Exam Blood pressure 110/70, pulse 98, weight 220 lb (99.8 kg), last menstrual period 06/23/2018, not currently breastfeeding. Wt Readings from Last 3 Encounters:  06/23/18 220 lb (99.8 kg)  05/26/18 226 lb (102.5 kg)  04/28/18 233 lb (105.7 kg)  Body mass index is 35.51 kg/m.   General: NAD HEENT: normocephalic, anicteric Thyroid: no enlargement Pulmonary: no increased work of breathing Neurologic: Grossly intact Psychiatric: mood appropriate, affect full  Assessment: 28 y.o. G2P1011 No problem-specific  Assessment & Plan notes found for this encounter.   Plan: Problem List Items Addressed This Visit    None    Visit Diagnoses    Class 2 obesity without serious comorbidity with body mass index (BMI) of 35.0 to 35.9 in adult, unspecified obesity type    -  Primary   Relevant Medications   phentermine (ADIPEX-P) 37.5 MG tablet      1) 1500 Calorie ADA Diet  2) Patient education given regarding appropriate lifestyle changes for weight loss including: regular physical activity, healthy coping strategies, caloric restriction and healthy eating patterns.  3) Patient will be started on weight loss medication. The risks and benefits and side effects of medication, such as Adipex (Phenteramine) ,  Tenuate (Diethylproprion), Belviq (lorcarsin), Contrave (buproprion/naltrexone), Qsymia (phentermine/topiramate), and Saxenda (liraglutide) is discussed. The pros and cons of suppressing appetite and boosting metabolism is discussed. Risks of tolerence and addiction is discussed for selected agents discussed. Use of medicine will ne short term, such as 3-4 months at a time followed by a period of time off of the medicine to avoid these risks and side effects for Adipex, Qsymia, and Tenuate discussed. Pt to call with any negative side effects and agrees to keep follow up appts.  4) Patient to take medication, with the benefits of appetite suppression and metabolism boost d/w pt, along with the side effects and risk factors of long term use that will be avoided with our use of short bursts of therapy. Rx provided.    5) 15 minutes face-to-face; with counseling/coordination of care > 50 percent of visit related to obesity and ongoing management/treatment   6)  Return in about 4 weeks (around 07/21/2018) for medication follow up.   [name], MD, [facility], [facility] 06/23/2018, 3:06 PM

## 2018-06-26 ENCOUNTER — Emergency Department: Payer: BLUE CROSS/BLUE SHIELD

## 2018-06-26 ENCOUNTER — Emergency Department
Admission: EM | Admit: 2018-06-26 | Discharge: 2018-06-26 | Disposition: A | Discharge status: Home / Self Care | Payer: BLUE CROSS/BLUE SHIELD | Attending: Emergency Medicine | Admitting: Emergency Medicine

## 2018-06-26 ENCOUNTER — Encounter: Payer: Self-pay | Admitting: *Deleted

## 2018-06-26 ENCOUNTER — Other Ambulatory Visit: Payer: Self-pay

## 2018-06-26 DIAGNOSIS — S0990XA Unspecified injury of head, initial encounter: Secondary | ICD-10-CM

## 2018-06-26 DIAGNOSIS — M546 Pain in thoracic spine: Secondary | ICD-10-CM | POA: Diagnosis not present

## 2018-06-26 DIAGNOSIS — S060X0A Concussion without loss of consciousness, initial encounter: Secondary | ICD-10-CM

## 2018-06-26 DIAGNOSIS — Y999 Unspecified external cause status: Secondary | ICD-10-CM | POA: Insufficient documentation

## 2018-06-26 DIAGNOSIS — S0003XA Contusion of scalp, initial encounter: Secondary | ICD-10-CM | POA: Diagnosis not present

## 2018-06-26 DIAGNOSIS — Y929 Unspecified place or not applicable: Secondary | ICD-10-CM | POA: Diagnosis not present

## 2018-06-26 DIAGNOSIS — W01198A Fall on same level from slipping, tripping and stumbling with subsequent striking against other object, initial encounter: Secondary | ICD-10-CM | POA: Insufficient documentation

## 2018-06-26 DIAGNOSIS — Y9389 Activity, other specified: Secondary | ICD-10-CM | POA: Insufficient documentation

## 2018-06-26 DIAGNOSIS — S299XXA Unspecified injury of thorax, initial encounter: Secondary | ICD-10-CM | POA: Diagnosis not present

## 2018-06-26 DIAGNOSIS — Z79899 Other long term (current) drug therapy: Secondary | ICD-10-CM | POA: Insufficient documentation

## 2018-06-26 DIAGNOSIS — M5489 Other dorsalgia: Secondary | ICD-10-CM | POA: Diagnosis not present

## 2018-06-26 DIAGNOSIS — R51 Headache: Secondary | ICD-10-CM | POA: Diagnosis not present

## 2018-06-26 DIAGNOSIS — R52 Pain, unspecified: Secondary | ICD-10-CM | POA: Diagnosis not present

## 2018-06-26 MED ORDER — TRAMADOL HCL 50 MG PO TABS: 50.0000 mg | ORAL_TABLET | Freq: Every evening | ORAL | 0 refills | Status: DC | PRN

## 2018-06-26 MED ORDER — NAPROXEN 500 MG PO TABS: 500.0000 mg | ORAL_TABLET | Freq: Two times a day (BID) | ORAL | 0 refills | Status: DC

## 2018-06-26 MED ORDER — ACETAMINOPHEN 325 MG PO TABS: 650.0000 mg | ORAL_TABLET | Freq: Four times a day (QID) | ORAL | 0 refills | Status: DC | PRN

## 2018-06-26 MED ORDER — LIDOCAINE 5 % EX PTCH
1.0000 | MEDICATED_PATCH | CUTANEOUS | Status: DC
  Administered 2018-06-26: 1 via TRANSDERMAL
  Filled 2018-06-26: qty 1

## 2018-06-26 MED ORDER — TRAMADOL HCL 50 MG PO TABS
50.0000 mg | ORAL_TABLET | Freq: Once | ORAL | Status: AC
  Administered 2018-06-26: 50 mg via ORAL
  Filled 2018-06-26: qty 1

## 2018-06-26 NOTE — ED Triage Notes (Signed)
Pt arrived via EMS after slipping on her wet porch steps while carrying her baby. She hit the steps with her back and has pain and bruising at mid back level and her tailbone. She also hit her head and has a goose egg swelling to her lower left side of her head. Pt was standing when EMS arrived and walked to the truck

## 2018-06-26 NOTE — ED Provider Notes (Signed)
-----------------------------------------   9:02 AM on 06/26/2018 ----------------------------------------- Thoracic x-ray and CT scan of the head unremarkable.  Vital signs remain normal.  Patient is calm.  On reexamination, she has specific tenderness in the right mid back in the soft tissue which very much exacerbates and reproduces her back pain.  Doubt occult spinal injury.  Had an extensive discussion with the patient about her symptoms which are indicative of concussion.  Advised rest and avoiding any strenuous or potentially dangerous activity.  Her infant sleeps through the night and is bottle-fed, no breast-feeding.  In the circumstances, I recommended NSAIDs with naproxen and Tylenol during the day when she needs maximum alertness to care for her infant, and adding tramadol as needed to help with pain control and sleep at night only.  Work note provided.  She works as a Industrial/product designer, next workday is in 4 days at which point I think that she should be able to return to duty.  Recommended she follow-up with primary care if she still having any symptoms at that point.  Counseled patient on dangers of opioid pain medication including tramadol and the potentially sedating side effects.  Also warned her about her potential for decreased alertness or reaction time or spatial processing with her concussion.   Sharman Cheek, MD 06/26/18 [phone]

## 2018-06-26 NOTE — ED Provider Notes (Signed)
[facility] Emergency Department Provider Note   First MD Initiated Contact with Patient 06/26/18 [phone]     (approximate)  I have reviewed the triage vital signs and the nursing notes.   HISTORY  Chief Complaint Fall    HPI Deborah House is a 28 y.o. female with list of previous medical conditions presents to the emergency department via EMS following accidental slip and fall while carrying her infant with resultant occipital head injury and upper mid back pain that is currently 9 out of 10.  Patient denies any loss of consciousness but however admits to nausea and dizziness with one episode of vomiting.   Past Medical History:  Diagnosis Date  . Abdominal pain   . Amenorrhea   . Anemia   . Chlamydia 2012   tx'd  . Family history of ovarian cancer 07/2015   genetic testing letter sent  . Hemorrhoids   . Low-lying placenta in third trimester   . Missed abortion 06/01/2014   [facility]    Patient Active Problem List   Diagnosis Date Noted  . Anemia affecting pregnancy 05/15/2017    Past Surgical History:  Procedure Laterality Date  . CESAREAN SECTION N/A 06/27/2017   Procedure: CESAREAN SECTION;  Surgeon: Vena AustriaStaebler, Andreas, MD;  Location: [facility] ORS;  Service: Obstetrics;  Laterality: N/A;  . DILATION AND CURETTAGE OF UTERUS  06/01/2014  . EYE SURGERY Left    when an infant    Prior to Admission medications   Medication Sig Start Date End Date Taking? Authorizing Provider  phentermine (ADIPEX-P) 37.5 MG tablet Take 1 tablet (37.5 mg total) by mouth daily before breakfast. 06/23/18   Vena AustriaStaebler, Andreas, MD    Allergies Ibuprofen  Family History  Problem Relation Age of Onset  . Diabetes Maternal Grandmother   . Ovarian cancer Maternal Grandmother 60  . Cancer Maternal Grandmother        Ovarian cancer  . Anemia Mother   . Anemia Sister     Social History Social History   Tobacco Use  . Smoking status: Never Smoker  . Smokeless  tobacco: Never Used  Substance Use Topics  . Alcohol use: No  . Drug use: No    Review of Systems Constitutional: No fever/chills Eyes: No visual changes. ENT: No sore throat. Cardiovascular: Denies chest pain. Respiratory: Denies shortness of breath. Gastrointestinal: No abdominal pain.  Positive for nausea, no vomiting.  No diarrhea.  No constipation. Genitourinary: Negative for dysuria. Musculoskeletal: Negative for neck pain.  Negative for back pain. Integumentary: Negative for rash. Neurological: Positive for occipital head injury, focal weakness or numbness.   ____________________________________________   PHYSICAL EXAM:  VITAL SIGNS: ED Triage Vitals  Enc Vitals Group     BP 06/26/18 0629 115/76     Pulse Rate 06/26/18 0629 62     Resp 06/26/18 0629 17     Temp 06/26/18 0629 97.8 F (36.6 C)     Temp Source 06/26/18 0629 Oral     SpO2 06/26/18 0629 100 %     Weight 06/26/18 0632 99.8 kg (220 lb)     Height 06/26/18 0632 1.676 m (5\' 6" )     Head Circumference --      Peak Flow --      Pain Score 06/26/18 0630 7     Pain Loc --      Pain Edu? --      Excl. in GC? --     Constitutional: Alert and oriented. Well  appearing and in no acute distress. Eyes: Conjunctivae are normal. PERRL. EOMI. Head: Occipital scalp swelling tender to touch Mouth/Throat: Mucous membranes are moist.  Oropharynx non-erythematous. Neck: No stridor. No cervical spine tenderness to palpation. Cardiovascular: Normal rate, regular rhythm. Good peripheral circulation. Grossly normal heart sounds. Respiratory: Normal respiratory effort.  No retractions. Lungs CTAB. Gastrointestinal: Soft and nontender. No distention.  Musculoskeletal: No lower extremity tenderness nor edema. No gross deformities of extremities. Neurologic:  Normal speech and language. No gross focal neurologic deficits are appreciated.  Skin:  Skin is warm, dry and intact. No rash noted. Psychiatric: Mood and affect are  normal. Speech and behavior are normal.  _________  RADIOLOGY I, [place] Julias Mould, personally viewed and evaluated these images (plain radiographs) as part of my medical decision making, as well as reviewing the written report by the radiologist.  ED MD interpretation: No evidence of intracranial injury on CT scalp contusion noted  Official radiology report(s): Ct Head Wo Contrast  Result Date: 06/26/2018 CLINICAL DATA:  Fall with head injury.  Initial encounter. EXAM: CT HEAD WITHOUT CONTRAST TECHNIQUE: Contiguous axial images were obtained from the base of the skull through the vertex without intravenous contrast. COMPARISON:  None. FINDINGS: Brain: No evidence of infarction, hemorrhage, hydrocephalus, extra-axial collection or mass lesion/mass effect. Vascular: Negative Skull: Left posterior scalp contusion without calvarial fracture Sinuses/Orbits: Negative for injury IMPRESSION: 1. No evidence of intracranial injury. 2. Scalp contusion without calvarial fracture. Electronically Signed   By: Marnee Spring M.D.   On: 06/26/2018 07:02      Procedures   ____________________________________________   INITIAL IMPRESSION / ASSESSMENT AND PLAN / ED COURSE  As part of my medical decision making, I reviewed the following data within the electronic MEDICAL RECORD NUMBER  28 year old female presented with above-stated history and physical exam secondary to accidental slip and fall.  Suspect concussion versus other potential intracranial abnormality or/skull fracture however CT head revealed scalp contusion with no intracranial abnormality.  Thoracic spine x-rays pending at this time.  If x-ray is negative suspect patient will be able to be discharged home.  Patient's care transferred to Dr. Scotty Court ____________________________________________  FINAL CLINICAL IMPRESSION(S) / ED DIAGNOSES  Final diagnoses:  Injury of head, initial encounter  Contusion of scalp, initial encounter  Concussion  without loss of consciousness, initial encounter     MEDICATIONS GIVEN DURING THIS VISIT:  Medications  lidocaine (LIDODERM) 5 % 1 patch (1 patch Transdermal Patch Applied 06/26/18 0645)  traMADol (ULTRAM) tablet 50 mg (50 mg Oral Given 06/26/18 0645)     ED Discharge Orders    None       Note:  This document was prepared using Dragon voice recognition software and may include unintentional dictation errors.   Darci Current, MD 06/26/18 [phone]

## 2018-07-21 ENCOUNTER — Encounter: Payer: Self-pay | Admitting: Obstetrics and Gynecology

## 2018-07-21 ENCOUNTER — Ambulatory Visit (INDEPENDENT_AMBULATORY_CARE_PROVIDER_SITE_OTHER): Payer: BLUE CROSS/BLUE SHIELD | Admitting: Obstetrics and Gynecology

## 2018-07-21 VITALS — BP 128/66 | HR 121 | Wt 218.0 lb

## 2018-07-21 DIAGNOSIS — Z6835 Body mass index (BMI) 35.0-35.9, adult: Secondary | ICD-10-CM | POA: Diagnosis not present

## 2018-07-21 DIAGNOSIS — E669 Obesity, unspecified: Secondary | ICD-10-CM

## 2018-07-21 NOTE — Progress Notes (Signed)
Gynecology Office Visit  Chief Complaint:  Chief Complaint  Deborah House presents with  . Weight Check    History of Present Illness: Patientis a 28 y.o. G19P1011 female, who presents for the evaluation of the desire to lose weight. She has lost 2 pounds 1 months, 3 month wight loss of 15lbs. The Deborah House states the following symptoms since starting her weight loss therapy: appetite suppression, energy, and weight loss.  The Deborah House also reports no other ill effects. The Deborah House specifically denies heart palpitations, anxiety, and insomnia.    Review of Systems: 10 point review of systems negative unless otherwise noted in HPI  Past Medical History:  Past Medical History:  Diagnosis Date  . Abdominal pain   . Amenorrhea   . Anemia   . Chlamydia 2012   tx'd  . Family history of ovarian cancer 07/2015   genetic testing letter sent  . Hemorrhoids   . Low-lying placenta in third trimester   . Missed abortion 06/01/2014   [facility]    Past Surgical History:  Past Surgical History:  Procedure Laterality Date  . CESAREAN SECTION N/A 06/27/2017   Procedure: CESAREAN SECTION;  Surgeon: [name], MD;  Location: [facility] ORS;  Service: Obstetrics;  Laterality: N/A;  . DILATION AND CURETTAGE OF UTERUS  06/01/2014  . EYE SURGERY Left    when an infant    Gynecologic History: Deborah House's last menstrual period was 06/23/2018.  Obstetric History: G2P1011  Family History:  Family History  Problem Relation Age of Onset  . Diabetes Maternal Grandmother   . Ovarian cancer Maternal Grandmother 60  . Cancer Maternal Grandmother        Ovarian cancer  . Anemia Mother   . Anemia Sister     Social History:  Social History   Socioeconomic History  . Marital status: Married    Spouse name: Not on file  . Number of children: 0  . Years of education: 74  . Highest education level: Not on file  Occupational History  . Occupation: RETAIL Airline pilot  Social Needs  . Financial resource  strain: Not on file  . Food insecurity:    Worry: Not on file    Inability: Not on file  . Transportation needs:    Medical: Not on file    Non-medical: Not on file  Tobacco Use  . Smoking status: Never Smoker  . Smokeless tobacco: Never Used  Substance and Sexual Activity  . Alcohol use: No  . Drug use: No  . Sexual activity: Yes  Lifestyle  . Physical activity:    Days per week: Not on file    Minutes per session: Not on file  . Stress: Not on file  Relationships  . Social connections:    Talks on phone: Not on file    Gets together: Not on file    Attends religious service: Not on file    Active member of club or organization: Not on file    Attends meetings of clubs or organizations: Not on file    Relationship status: Not on file  . Intimate partner violence:    Fear of current or ex partner: Not on file    Emotionally abused: Not on file    Physically abused: Not on file    Forced sexual activity: Not on file  Other Topics Concern  . Not on file  Social History Narrative  . Not on file    Allergies:  Allergies  Allergen Reactions  .  Ibuprofen Swelling    Suspected--unsure if it is a TRUE allergy.    Medications: Prior to Admission medications   Medication Sig Start Date End Date Taking? Authorizing Provider  phentermine (ADIPEX-P) 37.5 MG tablet Take 1 tablet (37.5 mg total) by mouth daily before breakfast. 06/23/18  Yes [name], MD  acetaminophen (TYLENOL) 325 MG tablet Take 2 tablets (650 mg total) by mouth every 6 (six) hours as needed. Deborah House not taking: Reported on 07/21/2018 06/26/18   Sharman Cheek, MD  naproxen (NAPROSYN) 500 MG tablet Take 1 tablet (500 mg total) by mouth 2 (two) times daily with a meal. Deborah House not taking: Reported on 07/21/2018 06/26/18   Sharman Cheek, MD  traMADol (ULTRAM) 50 MG tablet Take 1 tablet (50 mg total) by mouth at bedtime as needed. Deborah House not taking: Reported on 07/21/2018 06/26/18   Sharman Cheek, MD     Physical Exam Blood pressure 128/66, pulse (!) 121, weight 218 lb (98.9 kg), last menstrual period 06/23/2018, not currently breastfeeding. Wt Readings from Last 3 Encounters:  07/21/18 218 lb (98.9 kg)  06/26/18 220 lb (99.8 kg)  06/23/18 220 lb (99.8 kg)  Body mass index is 35.19 kg/m.   General: NAD HEENT: normocephalic, anicteric Thyroid: no enlargement Pulmonary: no increased work of breathing Neurologic: Grossly intact Psychiatric: mood appropriate, affect full  Assessment: 28 y.o. G2P1011 medical weight loss follow up Plan: Problem List Items Addressed This Visit    None    Visit Diagnoses    Class 2 obesity without serious comorbidity with body mass index (BMI) of 35.0 to 35.9 in adult, unspecified obesity type    -  Primary      1) 1500 Calorie ADA Diet  2) Deborah House education given regarding appropriate lifestyle changes for weight loss including: regular physical activity, healthy coping strategies, caloric restriction and healthy eating patterns.  3) Deborah House will be started on weight loss medication. The risks and benefits and side effects of medication, such as Adipex (Phenteramine) ,  Tenuate (Diethylproprion), Belviq (lorcarsin), Contrave (buproprion/naltrexone), Qsymia (phentermine/topiramate), and Saxenda (liraglutide) is discussed. The pros and cons of suppressing appetite and boosting metabolism is discussed. Risks of tolerence and addiction is discussed for selected agents discussed. Use of medicine will ne short term, such as 3-4 months at a time followed by a period of time off of the medicine to avoid these risks and side effects for Adipex, Qsymia, and Tenuate discussed. Pt to call with any negative side effects and agrees to keep follow up appts.  4) Deborah House to take medication, with the benefits of appetite suppression and metabolism boost d/w pt, along with the side effects and risk factors of long term use that will be avoided with our use of short  bursts of therapy. Rx provided.    5) 15 minutes face-to-face; with counseling/coordination of care > 50 percent of visit related to obesity and ongoing management/treatment   6)  Return in about 3 months (around 10/21/2018) for medication follow up.    [name], MD, [facility], [facility] 07/21/2018, 3:33 PM

## 2018-10-22 NOTE — Progress Notes (Deleted)
Gynecology Office Visit  Chief Complaint: No chief complaint on file.   History of Present Illness: Patientis a 28 y.o. 442P1011 female, who presents for the evaluation of the desire to lose weight. She has lost *** pounds {Time; days to months:17703}. The patient states the following symptoms since starting her weight loss therapy: appetite suppression, energy, and weight loss.  The patient also reports no other ill effects. The patient specifically denies heart palpitations, anxiety, and insomnia.    Review of Systems: 10 point review of systems negative unless otherwise noted in HPI  Past Medical History:  Past Medical History:  Diagnosis Date  . Abdominal pain   . Amenorrhea   . Anemia   . Chlamydia 2012   tx'd  . Family history of ovarian cancer 07/2015   genetic testing letter sent  . Hemorrhoids   . Low-lying placenta in third trimester   . [facility]    Past Surgical History:  Past Surgical History:  Procedure Laterality Date  . CESAREAN SECTION N/A 06/27/2017   Procedure: CESAREAN SECTION;  Surgeon: Vena AustriaStaebler, Jamerion Cabello, MD;  Location: [facility] ORS;  Service: Obstetrics;  Laterality: N/A;  . DILATION AND CURETTAGE OF UTERUS  06/01/2014  . EYE SURGERY Left    when an infant    Gynecologic History: No LMP recorded.  Obstetric History: G2P1011  Family History:  Family History  Problem Relation Age of Onset  . Diabetes Maternal Grandmother   . Ovarian cancer Maternal Grandmother 60  . Cancer Maternal Grandmother        Ovarian cancer  . Anemia Mother   . Anemia Sister     Social History:  Social History   Socioeconomic History  . Marital status: Married    Spouse name: Not on file  . Number of children: 0  . Years of education: 9412  . Highest education level: Not on file  Occupational History  . Occupation: [employer]AIL [employer]  Social Needs  . Financial resource strain: Not on file  . Food insecurity:    Worry: Not on file   Inability: Not on file  . Transportation needs:    Medical: Not on file    Non-medical: Not on file  Tobacco Use  . Smoking status: Never Smoker  . Smokeless tobacco: Never Used  Substance and Sexual Activity  . Alcohol use: No  . Drug use: No  . Sexual activity: Yes  Lifestyle  . Physical activity:    Days per week: Not on file    Minutes per session: Not on file  . Stress: Not on file  Relationships  . Social connections:    Talks on phone: Not on file    Gets together: Not on file    Attends religious service: Not on file    Active member of club or organization: Not on file    Attends meetings of clubs or organizations: Not on file    Relationship status: Not on file  . Intimate partner violence:    Fear of current or ex partner: Not on file    Emotionally abused: Not on file    Physically abused: Not on file    Forced sexual activity: Not on file  Other Topics Concern  . Not on file  Social History Narrative  . Not on file    Allergies:  Allergies  Allergen Reactions  . Ibuprofen Swelling    Suspected--unsure if it is a TRUE allergy.    Medications:  Prior to Admission medications   Medication Sig Start Date End Date Taking? Authorizing Provider  acetaminophen (TYLENOL) 325 MG tablet Take 2 tablets (650 mg total) by mouth every 6 (six) hours as needed. Patient not taking: Reported on 07/21/2018 06/26/18   Sharman Cheek, MD  naproxen (NAPROSYN) 500 MG tablet Take 1 tablet (500 mg total) by mouth 2 (two) times daily with a meal. Patient not taking: Reported on 07/21/2018 06/26/18   Sharman Cheek, MD  phentermine (ADIPEX-P) 37.5 MG tablet Take 1 tablet (37.5 mg total) by mouth daily before breakfast. 06/23/18   [name], MD  traMADol (ULTRAM) 50 MG tablet Take 1 tablet (50 mg total) by mouth at bedtime as needed. Patient not taking: Reported on 07/21/2018 06/26/18   Sharman Cheek, MD    Physical Exam Vitals *** not currently breastfeeding. Wt Readings  from Last 3 Encounters:  07/21/18 218 lb (98.9 kg)  06/26/18 220 lb (99.8 kg)  06/23/18 220 lb (99.8 kg)    General: NAD HEENT: normocephalic, anicteric Thyroid: no enlargement Pulmonary: no increased work of breathing Neurologic: Grossly intact Psychiatric: mood appropriate, affect full  Assessment: 28 y.o. [id number] No problem-specific Assessment & Plan notes found for this encounter.   Plan: Problem List Items Addressed This Visit    None      1) 1500 Calorie ADA Diet  2) Patient education given regarding appropriate lifestyle changes for weight loss including: regular physical activity, healthy coping strategies, caloric restriction and healthy eating patterns.  3) Patient will be started on weight loss medication. The risks and benefits and side effects of medication, such as Adipex (Phenteramine) ,  Tenuate (Diethylproprion), Belviq (lorcarsin), Contrave (buproprion/naltrexone), Qsymia (phentermine/topiramate), and Saxenda (liraglutide) is discussed. The pros and cons of suppressing appetite and boosting metabolism is discussed. Risks of tolerence and addiction is discussed for selected agents discussed. Use of medicine will ne short term, such as 3-4 months at a time followed by a period of time off of the medicine to avoid these risks and side effects for Adipex, Qsymia, and Tenuate discussed. Pt to call with any negative side effects and agrees to keep follow up appts.  4) Patient to take medication, with the benefits of appetite suppression and metabolism boost d/w pt, along with the side effects and risk factors of long term use that will be avoided with our use of short bursts of therapy. Rx provided.    5) 15 minutes face-to-face; with counseling/coordination of care > 50 percent of visit related to obesity and ongoing management/treatment   6)    [name], MD, [facility], [facility] 10/22/2018, 7:19 PM

## 2018-10-23 ENCOUNTER — Ambulatory Visit: Payer: BLUE CROSS/BLUE SHIELD | Admitting: Obstetrics and Gynecology

## 2018-11-05 ENCOUNTER — Ambulatory Visit (INDEPENDENT_AMBULATORY_CARE_PROVIDER_SITE_OTHER): Payer: BC Managed Care – PPO | Admitting: Obstetrics and Gynecology

## 2018-11-05 ENCOUNTER — Encounter: Payer: Self-pay | Admitting: Obstetrics and Gynecology

## 2018-11-05 ENCOUNTER — Other Ambulatory Visit: Payer: Self-pay

## 2018-11-05 ENCOUNTER — Other Ambulatory Visit (HOSPITAL_COMMUNITY)
Admission: RE | Admit: 2018-11-05 | Discharge: 2018-11-05 | Disposition: A | Discharge status: Home / Self Care | Payer: BC Managed Care – PPO | Source: Ambulatory Visit | Attending: Obstetrics and Gynecology | Admitting: Obstetrics and Gynecology

## 2018-11-05 VITALS — BP 104/70 | Wt 210.0 lb

## 2018-11-05 DIAGNOSIS — Z3480 Encounter for supervision of other normal pregnancy, unspecified trimester: Secondary | ICD-10-CM

## 2018-11-05 DIAGNOSIS — Z113 Encounter for screening for infections with a predominantly sexual mode of transmission: Secondary | ICD-10-CM

## 2018-11-05 DIAGNOSIS — O99210 Obesity complicating pregnancy, unspecified trimester: Secondary | ICD-10-CM

## 2018-11-05 DIAGNOSIS — Z3A01 Less than 8 weeks gestation of pregnancy: Secondary | ICD-10-CM | POA: Diagnosis not present

## 2018-11-05 DIAGNOSIS — Z3689 Encounter for other specified antenatal screening: Secondary | ICD-10-CM

## 2018-11-05 DIAGNOSIS — O34219 Maternal care for unspecified type scar from previous cesarean delivery: Secondary | ICD-10-CM

## 2018-11-05 DIAGNOSIS — O99211 Obesity complicating pregnancy, first trimester: Secondary | ICD-10-CM

## 2018-11-05 MED ORDER — BONJESTA 20-20 MG PO TBCR: 1.0000 | EXTENDED_RELEASE_TABLET | Freq: Two times a day (BID) | ORAL | 2 refills | Status: AC

## 2018-11-05 MED ORDER — ONDANSETRON 4 MG PO TBDP: 4.0000 mg | ORAL_TABLET | Freq: Four times a day (QID) | ORAL | 0 refills | Status: DC | PRN

## 2018-11-05 NOTE — Progress Notes (Signed)
New Obstetric Patient H&P    Chief Complaint: "Desires prenatal care"   History of Present Illness: Patient is a 28 y.o. [id number] Not Hispanic or Latino female, presents with amenorrhea and positive home pregnancy test. Patient's last menstrual period was 09/19/2018 (exact date). and based on her  LMP, her EDD is Estimated Date of Delivery: 06/26/19 and her EGA is [redacted]w[redacted]d. Her last pap smear was 11/07/2016 and was no abnormalities.   Her last menstrual period was normal. Since her LMP she claims she has experienced fatigue, breast tenderness, nausea. She denies vaginal bleeding. Her past medical history is noncontributory. Her prior pregnancies are notable for 06/2017 C-section secondary to low lying placenta  Since her LMP, she admits to the use of tobacco products  no She claims she has gained   no pounds since the start of her pregnancy.  There are cats in the home in the home  no  She admits close contact with children on a regular basis  yes  She has had chicken pox in the past yes She has had Tuberculosis exposures, symptoms, or previously tested positive for TB   no Current or past history of domestic violence. no  Genetic Screening/Teratology Counseling: (Includes patient, baby's father, or anyone in either family with:)   27. Patient's age >/= 76 at [facility]  no 2. Thalassemia ([place], [place], [place], or Asian background): MCV<80  no 3. Neural tube defect (meningomyelocele, spina bifida, anencephaly)  no 4. Congenital heart defect  no  5. Down syndrome  no 6. Tay-Sachs (Jewish, [place])  no 7. Canavan's Disease  no 8. Sickle cell disease or trait (African)  no  9. Hemophilia or other blood disorders  no  10. Muscular dystrophy  no  11. Cystic fibrosis  no  12. Huntington's Chorea  no  13. Mental retardation/autism  no 14. Other inherited genetic or chromosomal disorder  no 15. Maternal metabolic disorder (DM, PKU, etc)  no 16. Patient or FOB with a child with a birth  defect not listed above no  16a. Patient or FOB with a birth defect themselves no 17. Recurrent pregnancy loss, or stillbirth  no  18. Any medications since LMP other than prenatal vitamins (include vitamins, supplements, OTC meds, drugs, alcohol)  no 19. Any other genetic/environmental exposure to discuss  no  Infection History:   1. Lives with someone with TB or TB exposed  no  2. Patient or partner has history of genital herpes  no 3. Rash or viral illness since LMP  no 4. History of STI (GC, CT, HPV, syphilis, HIV)  no 5. History of recent travel :  no  Other pertinent information:  no     Review of Systems:10 point review of systems negative unless otherwise noted in HPI  Past Medical History:  Past Medical History:  Diagnosis Date  . Abdominal pain   . Amenorrhea   . Anemia   . Chlamydia 2012   tx'd  . Family history of ovarian cancer 07/2015   genetic testing letter sent  . Hemorrhoids   . Low-lying placenta in third trimester   . Missed abortion 06/01/2014   [facility]    Past Surgical History:  Past Surgical History:  Procedure Laterality Date  . CESAREAN SECTION N/A 06/27/2017   Procedure: CESAREAN SECTION;  Surgeon: Malachy Mood, MD;  Location: [facility] ORS;  Service: Obstetrics;  Laterality: N/A;  . DILATION AND CURETTAGE OF UTERUS  06/01/2014  . EYE SURGERY Left    when  an infant    Gynecologic History: Patient's last menstrual period was 09/19/2018 (exact date).  Obstetric History: G3P1011  Family History:  Family History  Problem Relation Age of Onset  . Diabetes Maternal Grandmother   . Ovarian cancer Maternal Grandmother 60  . Cancer Maternal Grandmother        Ovarian cancer  . Anemia Mother   . Anemia Sister     Social History:  Social History   Socioeconomic History  . Marital status: Married    Spouse name: Not on file  . Number of children: 0  . Years of education: 3912  . Highest education level: Not on file  Occupational History  .  Occupation: [employer]  Social Needs  . Financial resource strain: Not on file  . Food insecurity    Worry: Not on file    Inability: Not on file  . Transportation needs    Medical: Not on file    Non-medical: Not on file  Tobacco Use  . Smoking status: Never Smoker  . Smokeless tobacco: Never Used  Substance and Sexual Activity  . Alcohol use: No  . Drug use: No  . Sexual activity: Yes    Birth control/protection: None  Lifestyle  . Physical activity    Days per week: Not on file    Minutes per session: Not on file  . Stress: Not on file  Relationships  . Social Musicianconnections    Talks on phone: Not on file    Gets together: Not on file    Attends religious service: Not on file    Active member of club or organization: Not on file    Attends meetings of clubs or organizations: Not on file    Relationship status: Not on file  . Intimate partner violence    Fear of current or ex partner: Not on file    Emotionally abused: Not on file    Physically abused: Not on file    Forced sexual activity: Not on file  Other Topics Concern  . Not on file  Social History Narrative  . Not on file    Allergies:  Allergies  Allergen Reactions  . Ibuprofen Swelling    Suspected--unsure if it is a TRUE allergy.    Medications: Prior to Admission medications   Medication Sig Start Date End Date Taking? Authorizing Provider  Prenatal MV-Min-FA-Omega-3 (PRENATAL GUMMIES/DHA & FA) 0.4-32.5 MG CHEW Chew by mouth.   Yes [provider]  acetaminophen (TYLENOL) 325 MG tablet Take 2 tablets (650 mg total) by mouth every 6 (six) hours as needed. Patient not taking: Reported on 07/21/2018 06/26/18   Sharman CheekStafford, Phillip, MD  naproxen (NAPROSYN) 500 MG tablet Take 1 tablet (500 mg total) by mouth 2 (two) times daily with a meal. Patient not taking: Reported on 07/21/2018 06/26/18   Sharman CheekStafford, Phillip, MD  phentermine (ADIPEX-P) 37.5 MG tablet Take 1 tablet (37.5 mg total) by mouth daily before  breakfast. Patient not taking: Reported on 11/05/2018 06/23/18   Vena AustriaStaebler, Byford Schools, MD  traMADol (ULTRAM) 50 MG tablet Take 1 tablet (50 mg total) by mouth at bedtime as needed. Patient not taking: Reported on 07/21/2018 06/26/18   Sharman CheekStafford, Phillip, MD    Physical Exam Vitals: Blood pressure 104/70, weight 210 lb (95.3 kg), last menstrual period 09/19/2018, not currently breastfeeding. Body mass index is 33.89 kg/m.  General: NAD HEENT: normocephalic, anicteric Thyroid: no enlargement, no palpable nodules Pulmonary: No increased work of breathing, CTAB Cardiovascular: RRR, distal pulses  2+ Abdomen: NABS, soft, non-tender, non-distended.  Umbilicus without lesions.  No hepatomegaly, splenomegaly or masses palpable. No evidence of hernia  Genitourinary:  External: Normal external female genitalia.  Normal urethral meatus, normal  Bartholin's and Skene's glands.    Vagina: Normal vaginal mucosa, no evidence of prolapse.    Cervix: Grossly normal in appearance, no bleeding  Uterus:  Non-enlarged, mobile, normal contour.  No CMT  Adnexa: ovaries non-enlarged, no adnexal masses  Rectal: deferred Extremities: no edema, erythema, or tenderness Neurologic: Grossly intact Psychiatric: mood appropriate, affect full   Assessment: 28 y.o. G3P1011 at 6436w5d presenting to initiate prenatal care  Plan: 1) Avoid alcoholic beverages. 2) Patient encouraged not to smoke.  3) Discontinue the use of all non-medicinal drugs and chemicals.  4) Take prenatal vitamins daily.  5) Nutrition, food safety (fish, cheese advisories, and high nitrite foods) and exercise discussed. 6) Hospital and practice style discussed with cross coverage system.  7) Genetic Screening, such as with 1st Trimester Screening, cell free fetal DNA, AFP testing, and Ultrasound, as well as with amniocentesis and CVS as appropriate, is discussed with patient. At the conclusion of today's visit patient requested genetic testing 8) Patient is  asked about travel to areas at risk for the Zika virus, and counseled to avoid travel and exposure to mosquitoes or sexual partners who may have themselves been exposed to the virus. Testing is discussed, and will be ordered as appropriate.   Vena AustriaAndreas Larnce Schnackenberg, MD, Evern CoreFACOG [facility], [facility] 11/05/2018, 3:08 PM

## 2018-11-05 NOTE — Progress Notes (Signed)
NOB  Positive home test on 10/22/18 C/o nausea and vomiting

## 2018-11-07 LAB — RPR+RH+ABO+RUB AB+AB SCR+CB...
Antibody Screen: NEGATIVE
HIV Screen 4th Generation wRfx: NONREACTIVE
Hematocrit: 38 % (ref 34.0–46.6)
Hemoglobin: 12.5 g/dL (ref 11.1–15.9)
Hepatitis B Surface Ag: NEGATIVE
MCH: 26.2 pg — ABNORMAL LOW (ref 26.6–33.0)
MCHC: 32.9 g/dL (ref 31.5–35.7)
MCV: 80 fL (ref 79–97)
Platelets: 345 10*3/uL (ref 150–450)
RBC: 4.77 x10E6/uL (ref 3.77–5.28)
RDW: 13.1 % (ref 11.7–15.4)
RPR Ser Ql: NONREACTIVE
Rh Factor: NEGATIVE
Rubella Antibodies, IGG: 2.06 index (ref >0.99)
Varicella zoster IgG: 2092 index (ref >165)
WBC: 10.5 10*3/uL (ref 3.4–10.8)

## 2018-11-07 LAB — CERVICOVAGINAL ANCILLARY ONLY
Chlamydia: NEGATIVE
Neisseria Gonorrhea: NEGATIVE

## 2018-11-07 LAB — URINE CULTURE

## 2018-11-10 DIAGNOSIS — O99210 Obesity complicating pregnancy, unspecified trimester: Secondary | ICD-10-CM | POA: Insufficient documentation

## 2018-11-10 DIAGNOSIS — Z3480 Encounter for supervision of other normal pregnancy, unspecified trimester: Secondary | ICD-10-CM | POA: Insufficient documentation

## 2018-11-10 DIAGNOSIS — O34219 Maternal care for unspecified type scar from previous cesarean delivery: Secondary | ICD-10-CM | POA: Insufficient documentation

## 2018-11-13 ENCOUNTER — Encounter: Payer: Self-pay | Admitting: Advanced Practice Midwife

## 2018-11-13 ENCOUNTER — Other Ambulatory Visit: Payer: Self-pay

## 2018-11-13 ENCOUNTER — Ambulatory Visit (INDEPENDENT_AMBULATORY_CARE_PROVIDER_SITE_OTHER): Payer: BC Managed Care – PPO | Admitting: Advanced Practice Midwife

## 2018-11-13 ENCOUNTER — Ambulatory Visit (INDEPENDENT_AMBULATORY_CARE_PROVIDER_SITE_OTHER): Payer: BC Managed Care – PPO

## 2018-11-13 VITALS — BP 100/64 | Wt 211.0 lb

## 2018-11-13 DIAGNOSIS — Z3A01 Less than 8 weeks gestation of pregnancy: Secondary | ICD-10-CM | POA: Diagnosis not present

## 2018-11-13 DIAGNOSIS — Z3480 Encounter for supervision of other normal pregnancy, unspecified trimester: Secondary | ICD-10-CM

## 2018-11-13 DIAGNOSIS — O99210 Obesity complicating pregnancy, unspecified trimester: Secondary | ICD-10-CM

## 2018-11-13 DIAGNOSIS — N8312 Corpus luteum cyst of left ovary: Secondary | ICD-10-CM | POA: Diagnosis not present

## 2018-11-13 DIAGNOSIS — O99211 Obesity complicating pregnancy, first trimester: Secondary | ICD-10-CM

## 2018-11-13 DIAGNOSIS — Z3689 Encounter for other specified antenatal screening: Secondary | ICD-10-CM

## 2018-11-13 DIAGNOSIS — O3481 Maternal care for other abnormalities of pelvic organs, first trimester: Secondary | ICD-10-CM | POA: Diagnosis not present

## 2018-11-13 NOTE — Progress Notes (Signed)
ROB/US C/o Nausea not getting any better

## 2018-11-13 NOTE — Progress Notes (Addendum)
  Routine Prenatal Care Visit  Subjective  Deborah House is a 28 y.o. G3P1011 at [redacted]w[redacted]d being seen today for ongoing prenatal care.  She is currently monitored for the following issues for this high-risk pregnancy and has Anemia affecting pregnancy; Supervision of other normal pregnancy, antepartum; Pregnancy with history of cesarean section, antepartum; and Obesity affecting pregnancy, antepartum on their problem list.  ----------------------------------------------------------------------------------- Patient reports daily nausea without vomiting. Deborah House has not been helping and she was reluctant to start zofran. Reviewed comfort measures and encouraged to use zofran as needed.    . Vag. Bleeding: None.   . Denies leaking of fluid.  ----------------------------------------------------------------------------------- The following portions of the patient's history were reviewed and updated as appropriate: allergies, current medications, past family history, past medical history, past social history, past surgical history and problem list. Problem list updated.   Objective  Blood pressure 100/64, weight 211 lb (95.7 kg), last menstrual period 09/19/2018, not currently breastfeeding. Pregravid weight 210 lb (95.3 kg) Total Weight Gain 1 lb (0.454 kg) Urinalysis: Urine Protein    Urine Glucose    Fetal Status: Fetal Heart Rate (bpm): 129         EDD adjusted for 6 day difference at 7 weeks   General:  Alert, oriented and cooperative. Patient is in no acute distress.  Skin: Skin is warm and dry. No rash noted.   Cardiovascular: Normal heart rate noted  Respiratory: Normal respiratory effort, no problems with respiration noted  Abdomen: Soft, gravid, appropriate for gestational age.       Pelvic:  Cervical exam deferred        Extremities: Normal range of motion.     Mental Status: Normal mood and affect. Normal behavior. Normal judgment and thought content.   Assessment   28 y.o.  G3P1011 at [redacted]w[redacted]d by  07/02/2019, by Ultrasound presenting for routine prenatal visit  Plan   Pregnancy #3 Problems (from 09/19/18 to present)    Problem Noted Resolved   Supervision of other normal pregnancy, antepartum 11/10/2018 by Malachy Mood, MD No   Pregnancy with history of cesarean section, antepartum 11/10/2018 by Malachy Mood, MD No       Preterm labor symptoms and general obstetric precautions including but not limited to vaginal bleeding, contractions, leaking of fluid and fetal movement were reviewed in detail with the patient. Please refer to After Visit Summary for other counseling recommendations.   Nausea: use zofran as needed, small frequent meals, hydration, sea bands  Return in about 4 weeks (around 12/11/2018) for 1 hr gtt/other NOB labs/Mat21 and rob.  Rod Can, CNM 11/13/2018 12:40 PM

## 2018-11-13 NOTE — Patient Instructions (Signed)

## 2018-12-09 ENCOUNTER — Other Ambulatory Visit: Payer: BC Managed Care – PPO

## 2018-12-09 ENCOUNTER — Other Ambulatory Visit: Payer: Self-pay

## 2018-12-09 ENCOUNTER — Ambulatory Visit (INDEPENDENT_AMBULATORY_CARE_PROVIDER_SITE_OTHER): Payer: BC Managed Care – PPO | Admitting: Obstetrics and Gynecology

## 2018-12-09 VITALS — BP 112/60 | Wt 214.0 lb

## 2018-12-09 DIAGNOSIS — O99211 Obesity complicating pregnancy, first trimester: Secondary | ICD-10-CM

## 2018-12-09 DIAGNOSIS — Z6791 Unspecified blood type, Rh negative: Secondary | ICD-10-CM

## 2018-12-09 DIAGNOSIS — O26891 Other specified pregnancy related conditions, first trimester: Secondary | ICD-10-CM

## 2018-12-09 DIAGNOSIS — O99013 Anemia complicating pregnancy, third trimester: Secondary | ICD-10-CM

## 2018-12-09 DIAGNOSIS — Z1379 Encounter for other screening for genetic and chromosomal anomalies: Secondary | ICD-10-CM | POA: Diagnosis not present

## 2018-12-09 DIAGNOSIS — O99210 Obesity complicating pregnancy, unspecified trimester: Secondary | ICD-10-CM

## 2018-12-09 DIAGNOSIS — O26899 Other specified pregnancy related conditions, unspecified trimester: Secondary | ICD-10-CM | POA: Insufficient documentation

## 2018-12-09 DIAGNOSIS — O34219 Maternal care for unspecified type scar from previous cesarean delivery: Secondary | ICD-10-CM

## 2018-12-09 DIAGNOSIS — Z3A10 10 weeks gestation of pregnancy: Secondary | ICD-10-CM

## 2018-12-09 DIAGNOSIS — Z3480 Encounter for supervision of other normal pregnancy, unspecified trimester: Secondary | ICD-10-CM | POA: Diagnosis not present

## 2018-12-09 DIAGNOSIS — O99011 Anemia complicating pregnancy, first trimester: Secondary | ICD-10-CM

## 2018-12-09 MED ORDER — ONDANSETRON 4 MG PO TBDP: 4.0000 mg | ORAL_TABLET | Freq: Four times a day (QID) | ORAL | 0 refills | Status: DC | PRN

## 2018-12-09 NOTE — Progress Notes (Signed)
ROB Early GTT Refill Zofran Anxiety/GAD-7 given

## 2018-12-09 NOTE — Progress Notes (Signed)
Routine Prenatal Care Visit  Subjective  Deborah House is a 28 y.o. G3P1011 at [redacted]w[redacted]d being seen today for ongoing prenatal care.  She is currently monitored for the following issues for this low-risk pregnancy and has Anemia affecting pregnancy; Supervision of other normal pregnancy, antepartum; Pregnancy with history of cesarean section, antepartum; Obesity affecting pregnancy, antepartum; Rh negative state in antepartum period; and History of cesarean section complicating pregnancy on their problem list.  ----------------------------------------------------------------------------------- Patient reports no complaints.   Contractions: Not present. Vag. Bleeding: None.  Movement: Absent. Denies leaking of fluid.  ----------------------------------------------------------------------------------- The following portions of the patient's history were reviewed and updated as appropriate: allergies, current medications, past family history, past medical history, past social history, past surgical history and problem list. Problem list updated.   Objective  Blood pressure 112/60, weight 214 lb (97.1 kg), last menstrual period 09/19/2018, not currently breastfeeding. Pregravid weight 210 lb (95.3 kg) Total Weight Gain 4 lb (1.814 kg) Urinalysis:      Fetal Status: Fetal Heart Rate (bpm): 150   Movement: Absent     General:  Alert, oriented and cooperative. Patient is in no acute distress.  Skin: Skin is warm and dry. No rash noted.   Cardiovascular: Normal heart rate noted  Respiratory: Normal respiratory effort, no problems with respiration noted  Abdomen: Soft, gravid, appropriate for gestational age. Pain/Pressure: Absent     Pelvic:  Cervical exam deferred        Extremities: Normal range of motion.     ental Status: Normal mood and affect. Normal behavior. Normal judgment and thought content.     Assessment   28 y.o. [id number] at [redacted]w[redacted]d by  07/02/2019, by Ultrasound presenting  for routine prenatal visit  Plan   Pregnancy #3 Problems (from 09/19/18 to present)    Problem Noted Resolved   Rh negative state in antepartum period 12/09/2018 by Malachy Mood, MD No   History of cesarean section complicating pregnancy 2/95/1884 by Malachy Mood, MD No   Supervision of other normal pregnancy, antepartum 11/10/2018 by Malachy Mood, MD No   Overview Addendum 12/09/2018 10:02 PM by Malachy Mood, MD    Clinic [facility] Prenatal Labs  Dating  Blood type: O/Negative/-- (06/17 1536)   Genetic Screen 1 Screen:    AFP:     Quad:     NIPS: Antibody:Negative (06/17 1536)  Anatomic [place]  Rubella: 2.06 (06/17 1536) Varicella: Immune  GTT Early:               Third trimester:  RPR: Non Reactive (06/17 1536)   Rhogam  HBsAg: Negative (06/17 1536)   TDaP vaccine                       Flu Shot: HIV: Non Reactive (06/17 1536)   Baby Food                                GBS:   Contraception  Pap: 11/07/2016 NIL  CBB     CS/VBAC    Support Person Deborah House           Pregnancy with history of cesarean section, antepartum 11/10/2018 by Malachy Mood, MD No      Maternity 21 today Early 1-hr today Gestational age appropriate obstetric precautions including but not limited to vaginal bleeding, contractions, leaking of fluid and fetal movement were reviewed in detail with the patient.  Return in about 4 weeks (around 01/06/2019) for ROB.  Vena AustriaAndreas Rosielee Corporan, MD, Evern CoreFACOG [facility], [facility] 12/09/2018, 10:04 PM

## 2018-12-10 LAB — GLUCOSE, 1 HOUR GESTATIONAL: Gestational Diabetes Screen: 94 mg/dL (ref 65–139)

## 2018-12-13 LAB — MATERNIT 21 PLUS CORE, BLOOD
Fetal Fraction: 4
Result (T21): NEGATIVE
Trisomy 13 (Patau syndrome): NEGATIVE
Trisomy 18 (Edwards syndrome): NEGATIVE
Trisomy 21 (Down syndrome): NEGATIVE

## 2018-12-17 ENCOUNTER — Encounter: Payer: Self-pay | Admitting: Obstetrics and Gynecology

## 2019-01-06 ENCOUNTER — Ambulatory Visit (INDEPENDENT_AMBULATORY_CARE_PROVIDER_SITE_OTHER): Payer: BC Managed Care – PPO | Admitting: Maternal Newborn

## 2019-01-06 ENCOUNTER — Other Ambulatory Visit: Payer: Self-pay

## 2019-01-06 VITALS — BP 110/70 | Wt 219.0 lb

## 2019-01-06 DIAGNOSIS — Z3480 Encounter for supervision of other normal pregnancy, unspecified trimester: Secondary | ICD-10-CM

## 2019-01-06 DIAGNOSIS — O26892 Other specified pregnancy related conditions, second trimester: Secondary | ICD-10-CM

## 2019-01-06 DIAGNOSIS — O34219 Maternal care for unspecified type scar from previous cesarean delivery: Secondary | ICD-10-CM

## 2019-01-06 DIAGNOSIS — Z3A14 14 weeks gestation of pregnancy: Secondary | ICD-10-CM

## 2019-01-06 DIAGNOSIS — Z6791 Unspecified blood type, Rh negative: Secondary | ICD-10-CM

## 2019-01-06 DIAGNOSIS — Z3689 Encounter for other specified antenatal screening: Secondary | ICD-10-CM

## 2019-01-06 LAB — POCT URINALYSIS DIPSTICK OB
Glucose, UA: NEGATIVE
POC,PROTEIN,UA: NEGATIVE

## 2019-01-06 NOTE — Patient Instructions (Signed)

## 2019-01-06 NOTE — Addendum Note (Signed)
Addended by: Quintella Baton D on: 01/06/2019 03:32 PM   Modules accepted: Orders

## 2019-01-06 NOTE — Progress Notes (Signed)
Routine Prenatal Care Visit  Subjective  Deborah House is a 28 y.o. G3P1011 at [redacted]w[redacted]d being seen today for ongoing prenatal care.  She is currently monitored for the following issues for this low-risk pregnancy and has Anemia affecting pregnancy; Supervision of other normal pregnancy, antepartum; Pregnancy with history of cesarean section, antepartum; Obesity affecting pregnancy, antepartum; Rh negative state in antepartum period; and History of cesarean section complicating pregnancy on their problem list.  ----------------------------------------------------------------------------------- Patient reports nausea has improved some, usually only taking Diclegis now. She has indigestion with frequent burping. Feels anxious about having a miscarriage because she has had one in the past. She says that this resolved with her last pregnancy after she could feel the baby moving. Some pain in her groin when she is on her feet a lot or sitting up from a supine position. Contractions: Not present. Vag. Bleeding: None.  Movement: Absent. No leaking of fluid.  ----------------------------------------------------------------------------------- The following portions of the patient's history were reviewed and updated as appropriate: allergies, current medications, past family history, past medical history, past social history, past surgical history and problem list. Problem list updated.   Objective  Blood pressure 110/70, weight 219 lb (99.3 kg), last menstrual period 09/19/2018, not currently breastfeeding. Pregravid weight 210 lb (95.3 kg) Total Weight Gain 9 lb (4.082 kg)  Fetal Status: Fetal Heart Rate (bpm): 145   Movement: Absent     General:  Alert, oriented and cooperative. Patient is in no acute distress.  Skin: Skin is warm and dry. No rash noted.   Cardiovascular: Normal heart rate noted  Respiratory: Normal respiratory effort, no problems with respiration noted  Abdomen: Soft,  gravid, appropriate for gestational age. Pain/Pressure: Absent     Pelvic:  Cervical exam deferred        Extremities: Normal range of motion.     Mental Status: Normal mood and affect. Normal behavior. Normal judgment and thought content.     Assessment   28 y.o. [id number] at [redacted]w[redacted]d, EDD 07/02/2019 by Ultrasound presenting for a routine prenatal visit.  Plan   Pregnancy #3 Problems (from 09/19/18 to present)    Problem Noted Resolved   Rh negative state in antepartum period 12/09/2018 by Malachy Mood, MD No   History of cesarean section complicating pregnancy 8/56/3149 by Malachy Mood, MD No   Supervision of other normal pregnancy, antepartum 11/10/2018 by Malachy Mood, MD No   Overview Addendum 12/13/2018  9:30 PM by Malachy Mood, MD    [facility]inic [facility] Prenatal Labs  Dating 7 week [place] Blood type: O/Negative/-- (06/17 1536)   Genetic Screen 1 Screen:    AFP:     Quad:     NIPS:Normal XX Antibody:Negative (06/17 1536)  Anatomic [place]  Rubella: 2.06 (06/17 1536) Varicella: Immune  GTT Early:               Third trimester:  RPR: Non Reactive (06/17 1536)   Rhogam [ ]  28 weeks HBsAg: Negative (06/17 1536)   TDaP vaccine                       Flu Shot: HIV: Non Reactive (06/17 1536)   Baby Food                                GBS:   Contraception  Pap: 11/07/2016 NIL  CBB     CS/VBAC [ ]  Undecided  Support Person Jeanmychal           Pregnancy with history of cesarean section, antepartum 11/10/2018 by Vena AustriaStaebler, Andreas, MD No    Recommended OTC Pepcid for GI symptoms.  Anatomy scan next visit.  Please refer to After Visit Summary for other counseling recommendations.   Return in about 4 weeks (around 02/03/2019) for ROB and anatomy scan.  Marcelyn BruinsJacelyn Schmid, CNM 01/06/2019  2:30 PM

## 2019-01-20 ENCOUNTER — Telehealth: Payer: Self-pay | Admitting: Maternal Newborn

## 2019-01-20 ENCOUNTER — Ambulatory Visit (INDEPENDENT_AMBULATORY_CARE_PROVIDER_SITE_OTHER): Payer: BC Managed Care – PPO | Admitting: Maternal Newborn

## 2019-01-20 ENCOUNTER — Other Ambulatory Visit: Payer: Self-pay

## 2019-01-20 ENCOUNTER — Encounter: Payer: Self-pay | Admitting: Maternal Newborn

## 2019-01-20 VITALS — BP 128/88

## 2019-01-20 DIAGNOSIS — Z3A16 16 weeks gestation of pregnancy: Secondary | ICD-10-CM

## 2019-01-20 DIAGNOSIS — Z3482 Encounter for supervision of other normal pregnancy, second trimester: Secondary | ICD-10-CM

## 2019-01-20 DIAGNOSIS — Z3480 Encounter for supervision of other normal pregnancy, unspecified trimester: Secondary | ICD-10-CM

## 2019-01-20 NOTE — Patient Instructions (Signed)

## 2019-01-20 NOTE — Telephone Encounter (Signed)
Patient is schedule today with Jaci for fetal heart rate check 01/20/19

## 2019-01-20 NOTE — Telephone Encounter (Signed)
It is fine if she comes in for [facility] check.

## 2019-01-20 NOTE — Progress Notes (Signed)
Routine Prenatal Care Visit  Subjective  Deborah House is a 28 y.o. G3P1011 at 7357w5d being seen today for ongoing prenatal care.  She is currently monitored for the following issues for this low-risk pregnancy and has Anemia affecting pregnancy; Supervision of other normal pregnancy, antepartum; Pregnancy with history of cesarean section, antepartum; Obesity affecting pregnancy, antepartum; Rh negative state in antepartum period; and History of cesarean section complicating pregnancy on their problem list.  ----------------------------------------------------------------------------------- Patient is tearful and anxious, concerned because she is not yet feeling baby moving and would like to hear FHT today. Contractions: Not present. Vag. Bleeding: None.  Movement: Absent. No leaking of fluid.  ----------------------------------------------------------------------------------- The following portions of the patient's history were reviewed and updated as appropriate: allergies, current medications, past family history, past medical history, past social history, past surgical history and problem list. Problem list updated.   Objective  Blood pressure 128/88, last menstrual period 09/19/2018, not currently breastfeeding. Pregravid weight 210 lb (95.3 kg) Total Weight Gain 9 lb (4.082 kg)  Fetal Status: Fetal Heart Rate (bpm): 140   Movement: Absent     General:  Alert, oriented and cooperative. Patient is in no acute distress.  Skin: Skin is warm and dry. No rash noted.   Cardiovascular: Normal heart rate noted  Respiratory: Normal respiratory effort, no problems with respiration noted  Abdomen: Soft, gravid, appropriate for gestational age. Pain/Pressure: Absent     Pelvic:  Cervical exam deferred        Extremities: Normal range of motion.  Edema: None  Mental Status: Normal mood and affect. Normal behavior. Normal judgment and thought content.     Assessment   28 y.o.  [id number] at 6857w5d, EDD 07/02/2019 by Ultrasound presenting for a work-in prenatal visit.  Plan   Pregnancy #3 Problems (from 09/19/18 to present)    Problem Noted Resolved   Rh negative state in antepartum period 12/09/2018 by Vena AustriaStaebler, Andreas, MD No   History of cesarean section complicating pregnancy 12/09/2018 by Vena AustriaStaebler, Andreas, MD No   Supervision of other normal pregnancy, antepartum 11/10/2018 by Vena AustriaStaebler, Andreas, MD No   Overview Addendum 12/13/2018  9:30 PM by Vena AustriaStaebler, Andreas, MD    [facility]inic [facility] Prenatal Labs  Dating 7 week US Blood type: O/Negative/-- (06/17 1536)   Genetic Screen 1 Screen:    AFP:     Quad:     NIPS:Normal XX Antibody:Negative (06/17 1536)  Anatomic US  Rubella: 2.06 (06/17 1536) Varicella: Immune  GTT Early:               Third trimester:  RPR: Non Reactive (06/17 1536)   Rhogam [ ]  28 weeks HBsAg: Negative (06/17 1536)   TDaP vaccine                       Flu Shot: HIV: Non Reactive (06/17 1536)   Baby Food                                GBS:   Contraception  Pap: 11/07/2016 NIL  CBB     CS/VBAC [ ]  Undecided   Support Person Jeanmychal           Pregnancy with history of cesarean section, antepartum 11/10/2018 by Vena AustriaStaebler, Andreas, MD No    Discussed that she may not feel baby movements for a few more weeks. Likely has placental placement that is anterior and this  would make it harder to feel fetal movements. Good FHT heard today.  Asked about medication for anxiety; she states that it is situational and does not desire pharmaceutical intervention now. Advised relaxation strategies.  Please refer to After Visit Summary for other counseling recommendations.   Keep scheduled ROB with anatomy scan in 2 weeks.  Avel Sensor, CNM 01/20/2019  4:33 PM

## 2019-01-20 NOTE — Progress Notes (Signed)
Pt having bad anxiety today, crying a lot. Pt is afraid something is wrong.  Wanting to hear FHT no vb. No lof.

## 2019-01-20 NOTE — Telephone Encounter (Signed)
Patient is calling with anxiety and requesting an appointment for her to come in to hear the heart beat. Please advise you have an opening at 4

## 2019-01-21 ENCOUNTER — Encounter: Payer: BC Managed Care – PPO | Admitting: Advanced Practice Midwife

## 2019-02-03 ENCOUNTER — Other Ambulatory Visit: Payer: Self-pay

## 2019-02-03 ENCOUNTER — Ambulatory Visit (INDEPENDENT_AMBULATORY_CARE_PROVIDER_SITE_OTHER): Payer: BC Managed Care – PPO | Admitting: Obstetrics and Gynecology

## 2019-02-03 ENCOUNTER — Ambulatory Visit (INDEPENDENT_AMBULATORY_CARE_PROVIDER_SITE_OTHER): Payer: BC Managed Care – PPO

## 2019-02-03 VITALS — BP 112/56 | Wt 226.0 lb

## 2019-02-03 DIAGNOSIS — O26899 Other specified pregnancy related conditions, unspecified trimester: Secondary | ICD-10-CM

## 2019-02-03 DIAGNOSIS — Z363 Encounter for antenatal screening for malformations: Secondary | ICD-10-CM

## 2019-02-03 DIAGNOSIS — O99210 Obesity complicating pregnancy, unspecified trimester: Secondary | ICD-10-CM

## 2019-02-03 DIAGNOSIS — Z3A18 18 weeks gestation of pregnancy: Secondary | ICD-10-CM

## 2019-02-03 DIAGNOSIS — Z3480 Encounter for supervision of other normal pregnancy, unspecified trimester: Secondary | ICD-10-CM

## 2019-02-03 DIAGNOSIS — O26892 Other specified pregnancy related conditions, second trimester: Secondary | ICD-10-CM

## 2019-02-03 DIAGNOSIS — O99013 Anemia complicating pregnancy, third trimester: Secondary | ICD-10-CM

## 2019-02-03 DIAGNOSIS — O99012 Anemia complicating pregnancy, second trimester: Secondary | ICD-10-CM

## 2019-02-03 DIAGNOSIS — Z0489 Encounter for examination and observation for other specified reasons: Secondary | ICD-10-CM

## 2019-02-03 DIAGNOSIS — O99212 Obesity complicating pregnancy, second trimester: Secondary | ICD-10-CM

## 2019-02-03 DIAGNOSIS — O34219 Maternal care for unspecified type scar from previous cesarean delivery: Secondary | ICD-10-CM

## 2019-02-03 DIAGNOSIS — Z6791 Unspecified blood type, Rh negative: Secondary | ICD-10-CM

## 2019-02-03 DIAGNOSIS — Z3689 Encounter for other specified antenatal screening: Secondary | ICD-10-CM

## 2019-02-03 DIAGNOSIS — IMO0002 Reserved for concepts with insufficient information to code with codable children: Secondary | ICD-10-CM

## 2019-02-03 LAB — POCT URINALYSIS DIPSTICK OB
Glucose, UA: NEGATIVE
POC,PROTEIN,UA: NEGATIVE

## 2019-02-03 MED ORDER — ONDANSETRON 4 MG PO TBDP: 4.0000 mg | ORAL_TABLET | Freq: Four times a day (QID) | ORAL | 0 refills | Status: DC | PRN

## 2019-02-03 MED ORDER — DOXYLAMINE-PYRIDOXINE 10-10 MG PO TBEC: 2.0000 | DELAYED_RELEASE_TABLET | Freq: Every day | ORAL | 5 refills | Status: DC

## 2019-02-03 NOTE — Progress Notes (Signed)
ROB Anatomy scan today It is a GIRL 

## 2019-02-04 NOTE — Progress Notes (Signed)
Routine Prenatal Care Visit  Subjective  Deborah House is a 28 y.o. G3P1011 at 1516w6d being seen today for ongoing prenatal care.  She is currently monitored for the following issues for this low-risk pregnancy and has Anemia affecting pregnancy; Supervision of other normal pregnancy, antepartum; Pregnancy with history of cesarean section, antepartum; Obesity affecting pregnancy, antepartum; Rh negative state in antepartum period; and History of cesarean section complicating pregnancy on their problem list.  ----------------------------------------------------------------------------------- Patient reports no complaints.   Contractions: Not present. Vag. Bleeding: None.  Movement: Absent. Denies leaking of fluid.  ----------------------------------------------------------------------------------- The following portions of the patient's history were reviewed and updated as appropriate: allergies, current medications, past family history, past medical history, past social history, past surgical history and problem list. Problem list updated.   Objective  Blood pressure (!) 112/56, weight 226 lb (102.5 kg), last menstrual period 09/19/2018, not currently breastfeeding. Pregravid weight 210 lb (95.3 kg) Total Weight Gain 16 lb (7.258 kg) Urinalysis:      Fetal Status: Fetal Heart Rate (bpm): 135   Movement: Absent     General:  Alert, oriented and cooperative. Patient is in no acute distress.  Skin: Skin is warm and dry. No rash noted.   Cardiovascular: Normal heart rate noted  Respiratory: Normal respiratory effort, no problems with respiration noted  Abdomen: Soft, gravid, appropriate for gestational age. Pain/Pressure: Absent     Pelvic:  Cervical exam deferred        Extremities: Normal range of motion.     ental Status: Normal mood and affect. Normal behavior. Normal judgment and thought content.   [place] Ob Comp + 14 Wk  Result Date: 02/03/2019 Patient Name: Ulyess MortShakima Torain  Turney DOB: December 23, 1990 MRN: [id number] ULTRASOUND REPORT Location: [facility] Date of Service: 02/03/2019 Indications:Anatomy Ultrasound Findings: Mason JimSingleton intrauterine pregnancy is visualized with FHR at 129 BPM. Biometrics give an (U/S) Gestational age of 2345w4d and an (U/S) EDD of 07/03/2019; this correlates with the clinically established Estimated Date of Delivery: 07/02/19 Fetal presentation is Breech. EFW: 242g (9oz). Placenta: anterior. Grade: 0. Cervix: 3.2cm. AFI: subjectively normal. Anatomic survey is suboptimal for 4CH,outflow tracts and N/L due to body habitus and early gestational age; Gender - female.  Right Ovary: is normal in appearance. Left Ovary: is normal appearance. Survey of the adnexa demonstrates no adnexal masses. There is no free peritoneal fluid in the cul de sac. Impression: 1. 6364w5d Viable Singleton Intrauterine pregnancy by U/S. 2. (U/S) EDD is consistent with Clinically established Estimated Date of Delivery: 07/02/19 . 3. Normal Anatomy Scan, follow up 4CH/outflow tracts and nose/lips. Recommendations: 1.Clinical correlation with the patient's History and Physical Exam. Darlina GuysAbby M Clarke, RT  There is a singleton gestation with subjectively normal amniotic fluid volume. The fetal biometry correlates with established dating. Detailed evaluation of the fetal anatomy was performed.The fetal anatomical survey appears within normal limits within the resolution of ultrasound as described above.  4 chamber hear, outflow tracts, nose/lips incomplete.It must be noted that a normal ultrasound is unable to rule out fetal aneuploidy, subtle defects such as small ASD or VDS may also not be visible on imaging.  Vena AustriaAndreas Zell Doucette, MD, Evern CoreFACOG [facility], [facility] 02/03/2019, 3:43 PM    Assessment   28 y.o. G3P1011 at 6916w6d by  07/02/2019, by Ultrasound presenting for routine prenatal visit  Plan   Pregnancy #3 Problems (from 09/19/18 to present)    Problem Noted Resolved    Rh negative state in antepartum period 12/09/2018  by Malachy Mood, MD No   History of cesarean section complicating pregnancy 8/41/6606 by Malachy Mood, MD No   Supervision of other normal pregnancy, antepartum 11/10/2018 by Malachy Mood, MD No   Overview Addendum 12/13/2018  9:30 PM by Malachy Mood, MD    Clinic [facility] Prenatal Labs  Dating 7 week [place] Blood type: O/Negative/-- (06/17 1536)   Genetic Screen 1 Screen:    AFP:     Quad:     NIPS:Normal XX Antibody:Negative (06/17 1536)  Anatomic [place]  Rubella: 2.06 (06/17 1536) Varicella: Immune  GTT Early:               Third trimester:  RPR: Non Reactive (06/17 1536)   Rhogam [ ]  28 weeks HBsAg: Negative (06/17 1536)   TDaP vaccine                       Flu Shot: HIV: Non Reactive (06/17 1536)   Baby Food                                GBS:   Contraception  Pap: 11/07/2016 NIL  CBB     CS/VBAC [ ]  Undecided   Support Person Jeanmychal           Pregnancy with history of cesarean section, antepartum 11/10/2018 by Malachy Mood, MD No       Gestational age appropriate obstetric precautions including but not limited to vaginal bleeding, contractions, leaking of fluid and fetal movement were reviewed in detail with the patient.    - anatomy scan incomplete today follow up in 4 weeks  Return in about 4 weeks (around 03/03/2019) for ROB and follow up anatomy scan.  Malachy Mood, MD, [facility], [place]

## 2019-03-04 ENCOUNTER — Encounter: Payer: BC Managed Care – PPO | Admitting: Maternal Newborn

## 2019-03-04 ENCOUNTER — Ambulatory Visit: Payer: BC Managed Care – PPO

## 2019-03-11 ENCOUNTER — Ambulatory Visit (INDEPENDENT_AMBULATORY_CARE_PROVIDER_SITE_OTHER): Payer: BC Managed Care – PPO | Admitting: Advanced Practice Midwife

## 2019-03-11 ENCOUNTER — Encounter: Payer: Self-pay | Admitting: Advanced Practice Midwife

## 2019-03-11 ENCOUNTER — Other Ambulatory Visit: Payer: Self-pay

## 2019-03-11 ENCOUNTER — Ambulatory Visit (INDEPENDENT_AMBULATORY_CARE_PROVIDER_SITE_OTHER): Payer: BC Managed Care – PPO

## 2019-03-11 VITALS — BP 100/60 | Wt 235.0 lb

## 2019-03-11 DIAGNOSIS — Z113 Encounter for screening for infections with a predominantly sexual mode of transmission: Secondary | ICD-10-CM

## 2019-03-11 DIAGNOSIS — O34219 Maternal care for unspecified type scar from previous cesarean delivery: Secondary | ICD-10-CM

## 2019-03-11 DIAGNOSIS — Z3A23 23 weeks gestation of pregnancy: Secondary | ICD-10-CM

## 2019-03-11 DIAGNOSIS — Z3480 Encounter for supervision of other normal pregnancy, unspecified trimester: Secondary | ICD-10-CM

## 2019-03-11 DIAGNOSIS — Z362 Encounter for other antenatal screening follow-up: Secondary | ICD-10-CM

## 2019-03-11 DIAGNOSIS — Z130 Encounter for screening for diseases of the blood and blood-forming organs and certain disorders involving the immune mechanism: Secondary | ICD-10-CM

## 2019-03-11 DIAGNOSIS — Z131 Encounter for screening for diabetes mellitus: Secondary | ICD-10-CM

## 2019-03-11 LAB — POCT URINALYSIS DIPSTICK OB
Glucose, UA: NEGATIVE
POC,PROTEIN,UA: NEGATIVE

## 2019-03-11 NOTE — Progress Notes (Signed)
Routine Prenatal Care Visit  Subjective  Deborah House is a 28 y.o. G3P1011 at [redacted]w[redacted]d being seen today for ongoing prenatal care.  She is currently monitored for the following issues for this low-risk pregnancy and has Anemia affecting pregnancy; Supervision of other normal pregnancy, antepartum; Pregnancy with history of cesarean section, antepartum; Obesity affecting pregnancy, antepartum; Rh negative state in antepartum period; and History of cesarean section complicating pregnancy on their problem list.  ----------------------------------------------------------------------------------- Patient reports having more energy than she did in first trimester but still having fatigue. She is not taking iron because it makes her sick. Discussed increasing iron rich foods and trying to take PNV with Fe, 28 wk labs at next visit.   Contractions: Not present. Vag. Bleeding: None.  Movement: Present. Leaking Fluid denies.  ----------------------------------------------------------------------------------- The following portions of the patient's history were reviewed and updated as appropriate: allergies, current medications, past family history, past medical history, past social history, past surgical history and problem list. Problem list updated.  Objective  Blood pressure 100/60, weight 235 lb (106.6 kg), last menstrual period 09/19/2018, not currently breastfeeding. Pregravid weight 210 lb (95.3 kg) Total Weight Gain 25 lb (11.3 kg) Urinalysis: Urine Protein Negative  Urine Glucose Negative  Fetal Status: Fetal Heart Rate (bpm): 132 Fundal Height: 25 cm Movement: Present  Presentation: Transverse   Follow up anatomy scan today is complete and normal.  General:  Alert, oriented and cooperative. Patient is in no acute distress.  Skin: Skin is warm and dry. No rash noted.   Cardiovascular: Normal heart rate noted  Respiratory: Normal respiratory effort, no problems with respiration noted   Abdomen: Soft, gravid, appropriate for gestational age. Pain/Pressure: Absent     Pelvic:  Cervical exam deferred        Extremities: Normal range of motion.  Edema: None  Mental Status: Normal mood and affect. Normal behavior. Normal judgment and thought content.   Assessment   28 y.o. G3P1011 at [redacted]w[redacted]d by  07/02/2019, by Ultrasound presenting for routine prenatal visit  Plan   Pregnancy #3 Problems (from 09/19/18 to present)    Problem Noted Resolved   Rh negative state in antepartum period 12/09/2018 by [name], MD No   History of cesarean section complicating pregnancy 12/09/2018 by [name], MD No   Supervision of other normal pregnancy, antepartum 11/10/2018 by [name], MD No   Overview Addendum 12/13/2018  9:30 PM by [name], MD    Clinic [facility] Prenatal Labs  Dating 7 week [place] Blood type: O/Negative/-- (06/17 1536)   Genetic Screen 1 Screen:    AFP:     Quad:     NIPS:Normal XX Antibody:Negative (06/17 1536)  Anatomic [place]  Rubella: 2.06 (06/17 1536) Varicella: Immune  GTT Early:               Third trimester:  RPR: Non Reactive (06/17 1536)   Rhogam [ ]  28 weeks HBsAg: Negative (06/17 1536)   TDaP vaccine                       Flu Shot: HIV: Non Reactive (06/17 1536)   Baby Food                                GBS:   Contraception  Pap: 11/07/2016 NIL  CBB     CS/VBAC [ ]  Undecided   Support Person 11/09/2016  Pregnancy with history of cesarean section, antepartum 11/10/2018 by Malachy Mood, MD No       Preterm labor symptoms and general obstetric precautions including but not limited to vaginal bleeding, contractions, leaking of fluid and fetal movement were reviewed in detail with the patient. Fatigue: take PNV with Fe, increase Fe rich foods  Return in about 4 weeks (around 04/08/2019) for 28 wk labs and rob.  Rod Can, CNM 03/11/2019 11:23 AM

## 2019-03-18 ENCOUNTER — Encounter: Payer: Self-pay | Admitting: Maternal Newborn

## 2019-03-18 ENCOUNTER — Ambulatory Visit (INDEPENDENT_AMBULATORY_CARE_PROVIDER_SITE_OTHER): Payer: BC Managed Care – PPO | Admitting: Maternal Newborn

## 2019-03-18 ENCOUNTER — Other Ambulatory Visit: Payer: Self-pay

## 2019-03-18 VITALS — BP 110/60 | Wt 235.0 lb

## 2019-03-18 DIAGNOSIS — Z3A24 24 weeks gestation of pregnancy: Secondary | ICD-10-CM

## 2019-03-18 DIAGNOSIS — Z0289 Encounter for other administrative examinations: Secondary | ICD-10-CM

## 2019-03-18 DIAGNOSIS — O99012 Anemia complicating pregnancy, second trimester: Secondary | ICD-10-CM

## 2019-03-18 LAB — POCT URINALYSIS DIPSTICK OB
Glucose, UA: NEGATIVE
POC,PROTEIN,UA: NEGATIVE

## 2019-03-18 LAB — POCT HEMOGLOBIN: Hemoglobin: 10.4 g/dL — AB (ref 11–14.6)

## 2019-03-18 MED ORDER — FUSION 65-65-25-30 MG PO CAPS: 1.0000 | ORAL_CAPSULE | Freq: Every day | ORAL | 4 refills | Status: DC

## 2019-03-18 NOTE — Progress Notes (Signed)
Prenatal Problem Visit  Subjective  Deborah House is a 28 y.o. G3P1011 at [redacted]w[redacted]d being seen today for ongoing prenatal care.  She is currently monitored for the following issues for this low-risk pregnancy and has Anemia affecting pregnancy; Supervision of other normal pregnancy, antepartum; Pregnancy with history of cesarean section, antepartum; Obesity affecting pregnancy, antepartum; Rh negative state in antepartum period; and History of cesarean section complicating pregnancy on their problem list.  ----------------------------------------------------------------------------------- Patient reports dizziness and the feeling that she might pass out. This has been happening during the past several days, mostly at work, but recently also at home. Some cravings to chew ice. Reports that she needed iron infusions in her last pregnancy. Contractions: Not present. Vag. Bleeding: None.  Movement: Present. No leaking of fluid.  ----------------------------------------------------------------------------------- The following portions of the patient's history were reviewed and updated as appropriate: allergies, current medications, past family history, past medical history, past social history, past surgical history and problem list. Problem list updated.  Objective  Blood pressure 110/60, weight 235 lb (106.6 kg), last menstrual period 09/19/2018. Pregravid weight 210 lb (95.3 kg) Total Weight Gain 25 lb (11.3 kg) Urinalysis: Urine dipstick shows negative for glucose, protein. Fetal Status: Fetal Heart Rate (bpm): 145   Movement: Present     General:  Alert, oriented and cooperative. Patient is in no acute distress.  Skin: Skin is warm and dry. No rash noted.   Cardiovascular: Normal heart rate noted  Respiratory: Normal respiratory effort, no problems with respiration noted  Abdomen: Soft, gravid, appropriate for gestational age. Pain/Pressure: Absent     Pelvic:  Cervical exam deferred         Extremities: Normal range of motion.     Mental Status: Normal mood and affect. Normal behavior. Normal judgment and thought content.     Assessment   28 y.o. [id number] at [redacted]w[redacted]d EDD 07/02/2019, by Ultrasound presenting for a work-in prenatal visit.  Plan   Pregnancy #3 Problems (from 09/19/18 to present)    Problem Noted Resolved   Rh negative state in antepartum period 12/09/2018 by [name], MD No   History of cesarean section complicating pregnancy 12/09/2018 by [name], MD No   Supervision of other normal pregnancy, antepartum 11/10/2018 by [name], MD No   Overview Addendum 12/13/2018  9:30 PM by [name], MD    Clinic [facility] Prenatal Labs  Dating 7 week [place] Blood type: O/Negative/-- (06/17 1536)   Genetic Screen 1 Screen:    AFP:     Quad:     NIPS:Normal XX Antibody:Negative (06/17 1536)  Anatomic [place]  Rubella: 2.06 (06/17 1536) Varicella: Immune  GTT Early:               Third trimester:  RPR: Non Reactive (06/17 1536)   Rhogam [ ]  28 weeks HBsAg: Negative (06/17 1536)   TDaP vaccine                       Flu Shot: HIV: Non Reactive (06/17 1536)   Baby Food                                GBS:   Contraception  Pap: 11/07/2016 NIL  CBB     CS/VBAC [ ]  Undecided   Support Person Jeanmychal           Pregnancy with history of cesarean section, antepartum 11/10/2018 by  Malachy Mood, MD No      POC Hgb was 10.4. Discussed various causes of dizziness including anemia, hypoglycemia, hypotension, overheating. Advised measures to help such as rising slowly, good hydration, and small frequent meals. She has been unable to tolerate iron supplements as they cause stomach pain. Will try Fusion Plus; sample given and Rx sent.  Please refer to After Visit Summary for other counseling recommendations.   Keep scheduled ROB on 04/08/2019.  Avel Sensor, CNM 03/18/2019  3:23 PM

## 2019-03-18 NOTE — Progress Notes (Signed)
C/o dizziness - every other day feels like she's going to pass out.  Is starting to get a H/[id number]

## 2019-03-19 ENCOUNTER — Telehealth: Payer: Self-pay

## 2019-03-19 NOTE — Telephone Encounter (Signed)
FMLA/DISABILITY form for [employer] filled out, signature obtained, and given to KT for processing.

## 2019-04-08 ENCOUNTER — Encounter: Payer: Self-pay | Admitting: Advanced Practice Midwife

## 2019-04-08 ENCOUNTER — Other Ambulatory Visit: Payer: BC Managed Care – PPO

## 2019-04-08 ENCOUNTER — Other Ambulatory Visit: Payer: Self-pay

## 2019-04-08 ENCOUNTER — Ambulatory Visit (INDEPENDENT_AMBULATORY_CARE_PROVIDER_SITE_OTHER): Payer: BC Managed Care – PPO | Admitting: Advanced Practice Midwife

## 2019-04-08 VITALS — BP 100/60 | Wt 240.0 lb

## 2019-04-08 DIAGNOSIS — Z113 Encounter for screening for infections with a predominantly sexual mode of transmission: Secondary | ICD-10-CM | POA: Diagnosis not present

## 2019-04-08 DIAGNOSIS — Z3480 Encounter for supervision of other normal pregnancy, unspecified trimester: Secondary | ICD-10-CM

## 2019-04-08 DIAGNOSIS — Z131 Encounter for screening for diabetes mellitus: Secondary | ICD-10-CM | POA: Diagnosis not present

## 2019-04-08 DIAGNOSIS — Z3A27 27 weeks gestation of pregnancy: Secondary | ICD-10-CM | POA: Diagnosis not present

## 2019-04-08 DIAGNOSIS — Z6791 Unspecified blood type, Rh negative: Secondary | ICD-10-CM

## 2019-04-08 DIAGNOSIS — Z130 Encounter for screening for diseases of the blood and blood-forming organs and certain disorders involving the immune mechanism: Secondary | ICD-10-CM | POA: Diagnosis not present

## 2019-04-08 DIAGNOSIS — Z23 Encounter for immunization: Secondary | ICD-10-CM | POA: Diagnosis not present

## 2019-04-08 DIAGNOSIS — O26892 Other specified pregnancy related conditions, second trimester: Secondary | ICD-10-CM

## 2019-04-08 DIAGNOSIS — O99212 Obesity complicating pregnancy, second trimester: Secondary | ICD-10-CM

## 2019-04-08 DIAGNOSIS — O34219 Maternal care for unspecified type scar from previous cesarean delivery: Secondary | ICD-10-CM

## 2019-04-08 MED ORDER — RHO D IMMUNE GLOBULIN 1500 UNIT/2ML IJ SOSY
300.0000 ug | PREFILLED_SYRINGE | Freq: Once | INTRAMUSCULAR | Status: AC
  Administered 2019-04-08: 300 ug via INTRAMUSCULAR

## 2019-04-08 NOTE — Addendum Note (Signed)
Addended by: Quintella Baton D on: 04/08/2019 03:56 PM   Modules accepted: Orders

## 2019-04-08 NOTE — Patient Instructions (Signed)
Third Trimester of Pregnancy The third trimester is from week 28 through week 40 (months 7 through 9). The third trimester is a time when the unborn baby (fetus) is growing rapidly. At the end of the ninth month, the fetus is about 20 inches in length and weighs 6-10 pounds. Body changes during your third trimester Your body will continue to go through many changes during pregnancy. The changes vary from woman to woman. During the third trimester:  Your weight will continue to increase. You can expect to gain 25-35 pounds (11-16 kg) by the end of the pregnancy.  You may begin to get stretch marks on your hips, abdomen, and breasts.  You may urinate more often because the fetus is moving lower into your pelvis and pressing on your bladder.  You may develop or continue to have heartburn. This is caused by increased hormones that slow down muscles in the digestive tract.  You may develop or continue to have constipation because increased hormones slow digestion and cause the muscles that push waste through your intestines to relax.  You may develop hemorrhoids. These are swollen veins (varicose veins) in the rectum that can itch or be painful.  You may develop swollen, bulging veins (varicose veins) in your legs.  You may have increased body aches in the pelvis, back, or thighs. This is due to weight gain and increased hormones that are relaxing your joints.  You may have changes in your hair. These can include thickening of your hair, rapid growth, and changes in texture. Some women also have hair loss during or after pregnancy, or hair that feels dry or thin. Your hair will most likely return to normal after your baby is born.  Your breasts will continue to grow and they will continue to become tender. A yellow fluid (colostrum) may leak from your breasts. This is the first milk you are producing for your baby.  Your belly button may stick out.  You may notice more swelling in your hands,  face, or ankles.  You may have increased tingling or numbness in your hands, arms, and legs. The skin on your belly may also feel numb.  You may feel short of breath because of your expanding uterus.  You may have more problems sleeping. This can be caused by the size of your belly, increased need to urinate, and an increase in your body's metabolism.  You may notice the fetus "dropping," or moving lower in your abdomen (lightening).  You may have increased vaginal discharge.  You may notice your joints feel loose and you may have pain around your pelvic bone. What to expect at prenatal visits You will have prenatal exams every 2 weeks until week 36. Then you will have weekly prenatal exams. During a routine prenatal visit:  You will be weighed to make sure you and the baby are growing normally.  Your blood pressure will be taken.  Your abdomen will be measured to track your baby's growth.  The fetal heartbeat will be listened to.  Any test results from the previous visit will be discussed.  You may have a cervical check near your due date to see if your cervix has softened or thinned (effaced).  You will be tested for Group B streptococcus. This happens between 35 and 37 weeks. Your health care provider may ask you:  What your birth plan is.  How you are feeling.  If you are feeling the baby move.  If you have had any abnormal   symptoms, such as leaking fluid, bleeding, severe headaches, or abdominal cramping.  If you are using any tobacco products, including cigarettes, chewing tobacco, and electronic cigarettes.  If you have any questions. Other tests or screenings that may be performed during your third trimester include:  Blood tests that check for low iron levels (anemia).  Fetal testing to check the health, activity level, and growth of the fetus. Testing is done if you have certain medical conditions or if there are problems during the pregnancy.  Nonstress test  (NST). This test checks the health of your baby to make sure there are no signs of problems, such as the baby not getting enough oxygen. During this test, a belt is placed around your belly. The baby is made to move, and its heart rate is monitored during movement. What is false labor? False labor is a condition in which you feel small, irregular tightenings of the muscles in the womb (contractions) that usually go away with rest, changing position, or drinking water. These are called Braxton Hicks contractions. Contractions may last for hours, days, or even weeks before true labor sets in. If contractions come at regular intervals, become more frequent, increase in intensity, or become painful, you should see your health care provider. What are the signs of labor?  Abdominal cramps.  Regular contractions that start at 10 minutes apart and become stronger and more frequent with time.  Contractions that start on the top of the uterus and spread down to the lower abdomen and back.  Increased pelvic pressure and dull back pain.  A watery or bloody mucus discharge that comes from the vagina.  Leaking of amniotic fluid. This is also known as your "water breaking." It could be a slow trickle or a gush. Let your health care provider know if it has a color or strange odor. If you have any of these signs, call your health care provider right away, even if it is before your due date. Follow these instructions at home: Medicines  Follow your health care provider's instructions regarding medicine use. Specific medicines may be either safe or unsafe to take during pregnancy.  Take a prenatal vitamin that contains at least 600 micrograms (mcg) of folic acid.  If you develop constipation, try taking a stool softener if your health care provider approves. Eating and drinking   Eat a balanced diet that includes fresh fruits and vegetables, whole grains, good sources of protein such as meat, eggs, or tofu,  and low-fat dairy. Your health care provider will help you determine the amount of weight gain that is right for you.  Avoid raw meat and uncooked cheese. These carry germs that can cause birth defects in the baby.  If you have low calcium intake from food, talk to your health care provider about whether you should take a daily calcium supplement.  Eat four or five small meals rather than three large meals a day.  Limit foods that are high in fat and processed sugars, such as fried and sweet foods.  To prevent constipation: ? Drink enough fluid to keep your urine clear or pale yellow. ? Eat foods that are high in fiber, such as fresh fruits and vegetables, whole grains, and beans. Activity  Exercise only as directed by your health care provider. Most women can continue their usual exercise routine during pregnancy. Try to exercise for 30 minutes at least 5 days a week. Stop exercising if you experience uterine contractions.  Avoid heavy lifting.  Do   not exercise in extreme heat or humidity, or at high altitudes.  Wear low-heel, comfortable shoes.  Practice good posture.  You may continue to have sex unless your health care provider tells you otherwise. Relieving pain and discomfort  Take frequent breaks and rest with your legs elevated if you have leg cramps or low back pain.  Take warm sitz baths to soothe any pain or discomfort caused by hemorrhoids. Use hemorrhoid cream if your health care provider approves.  Wear a good support bra to prevent discomfort from breast tenderness.  If you develop varicose veins: ? Wear support pantyhose or compression stockings as told by your healthcare provider. ? Elevate your feet for 15 minutes, 3-4 times a day. Prenatal care  Write down your questions. Take them to your prenatal visits.  Keep all your prenatal visits as told by your health care provider. This is important. Safety  Wear your seat belt at all times when driving.  Make  a list of emergency phone numbers, including numbers for family, friends, the hospital, and police and fire departments. General instructions  Avoid cat litter boxes and soil used by cats. These carry germs that can cause birth defects in the baby. If you have a cat, ask someone to clean the litter box for you.  Do not travel far distances unless it is absolutely necessary and only with the approval of your health care provider.  Do not use hot tubs, steam rooms, or saunas.  Do not drink alcohol.  Do not use any products that contain nicotine or tobacco, such as cigarettes and e-cigarettes. If you need help quitting, ask your health care provider.  Do not use any medicinal herbs or unprescribed drugs. These chemicals affect the formation and growth of the baby.  Do not douche or use tampons or scented sanitary pads.  Do not cross your legs for long periods of time.  To prepare for the arrival of your baby: ? Take prenatal classes to understand, practice, and ask questions about labor and delivery. ? Make a trial run to the hospital. ? Visit the hospital and tour the maternity area. ? Arrange for maternity or paternity leave through employers. ? Arrange for family and friends to take care of pets while you are in the hospital. ? Purchase a rear-facing car seat and make sure you know how to install it in your car. ? Pack your hospital bag. ? Prepare the baby's nursery. Make sure to remove all pillows and stuffed animals from the baby's crib to prevent suffocation.  Visit your dentist if you have not gone during your pregnancy. Use a soft toothbrush to brush your teeth and be gentle when you floss. Contact a health care provider if:  You are unsure if you are in labor or if your water has broken.  You become dizzy.  You have mild pelvic cramps, pelvic pressure, or nagging pain in your abdominal area.  You have lower back pain.  You have persistent nausea, vomiting, or diarrhea.   You have an unusual or bad smelling vaginal discharge.  You have pain when you urinate. Get help right away if:  Your water breaks before 37 weeks.  You have regular contractions less than 5 minutes apart before 37 weeks.  You have a fever.  You are leaking fluid from your vagina.  You have spotting or bleeding from your vagina.  You have severe abdominal pain or cramping.  You have rapid weight loss or weight gain.  You have   shortness of breath with chest pain.  You notice sudden or extreme swelling of your face, hands, ankles, feet, or legs.  Your baby makes fewer than 10 movements in 2 hours.  You have severe headaches that do not go away when you take medicine.  You have vision changes. Summary  The third trimester is from week 28 through week 40, months 7 through 9. The third trimester is a time when the unborn baby (fetus) is growing rapidly.  During the third trimester, your discomfort may increase as you and your baby continue to gain weight. You may have abdominal, leg, and back pain, sleeping problems, and an increased need to urinate.  During the third trimester your breasts will keep growing and they will continue to become tender. A yellow fluid (colostrum) may leak from your breasts. This is the first milk you are producing for your baby.  False labor is a condition in which you feel small, irregular tightenings of the muscles in the womb (contractions) that eventually go away. These are called Braxton Hicks contractions. Contractions may last for hours, days, or even weeks before true labor sets in.  Signs of labor can include: abdominal cramps; regular contractions that start at 10 minutes apart and become stronger and more frequent with time; watery or bloody mucus discharge that comes from the vagina; increased pelvic pressure and dull back pain; and leaking of amniotic fluid. This information is not intended to replace advice given to you by your health  care provider. Make sure you discuss any questions you have with your health care provider. Document Released: 05/01/2001 Document Revised: 08/28/2018 Document Reviewed: 06/12/2016 Elsevier Patient Education  2020 [employer]  

## 2019-04-08 NOTE — Progress Notes (Signed)
Routine Prenatal Care Visit  Subjective  Deborah House is a 28 y.o. G3P1011 at [redacted]w[redacted]d being seen today for ongoing prenatal care.  She is currently monitored for the following issues for this low-risk pregnancy and has Anemia affecting pregnancy; Supervision of other normal pregnancy, antepartum; Pregnancy with history of cesarean section, antepartum; Obesity affecting pregnancy, antepartum; Rh negative state in antepartum period; and History of cesarean section complicating pregnancy on their problem list.  ----------------------------------------------------------------------------------- Patient reports small skin tags on her areola.  She denies any itching, pain or discharge on her areola. Contractions: Not present. Vag. Bleeding: None.  Movement: Present. Leaking Fluid denies.  ----------------------------------------------------------------------------------- The following portions of the patient's history were reviewed and updated as appropriate: allergies, current medications, past family history, past medical history, past social history, past surgical history and problem list. Problem list updated.  Objective  Blood pressure 100/60, weight 240 lb (108.9 kg), last menstrual period 09/19/2018, not currently breastfeeding. Pregravid weight 210 lb (95.3 kg) Total Weight Gain 30 lb (13.6 kg) Urinalysis: Urine Protein    Urine Glucose    Fetal Status: Fetal Heart Rate (bpm): 128 Fundal Height: 29 cm Movement: Present     General:  Alert, oriented and cooperative. Patient is in no acute distress.  Skin: Skin is warm and dry. No rash noted. Breast: bilateral areola with small (less than 1 mm each) skin tags? 2 on left and 1 on right. Appear flat/same color as surrounding skin  Cardiovascular: Normal heart rate noted  Respiratory: Normal respiratory effort, no problems with respiration noted  Abdomen: Soft, gravid, appropriate for gestational age. Pain/Pressure: Absent     Pelvic:   Cervical exam deferred        Extremities: Normal range of motion.  Edema: None  Mental Status: Normal mood and affect. Normal behavior. Normal judgment and thought content.   Assessment   28 y.o. G3P1011 at [redacted]w[redacted]d by  07/02/2019, by Ultrasound presenting for routine prenatal visit  Plan   Pregnancy #3 Problems (from 09/19/18 to present)    Problem Noted Resolved   Rh negative state in antepartum period 12/09/2018 by [name], MD No   History of cesarean section complicating pregnancy 12/09/2018 by [name], MD No   Supervision of other normal pregnancy, antepartum 11/10/2018 by [name], MD No   Overview Addendum 04/08/2019  2:59 PM by Tresea Mall, CNM    Clinic [facility] Prenatal Labs  Dating 7 week [place] Blood type: O/Negative/-- (06/17 1536)   Genetic Screen 1 Screen:    AFP:     Quad:     NIPS:Normal XX Antibody:Negative (06/17 1536)  Anatomic [place]  Rubella: 2.06 (06/17 1536) Varicella: Immune  GTT Early:               Third trimester:  RPR: Non Reactive (06/17 1536)   Rhogam [x]  28 weeks 11/18 HBsAg: Negative (06/17 1536)   Vaccines TDAP:                       Flu Shot: 11/18 HIV: Non Reactive (06/17 1536)   Baby Food                                GBS:   Contraception  Pap: 11/07/2016 NIL  CBB     CS/VBAC [ ]  Undecided   Support Person Jeanmychal           Pregnancy with history of  cesarean section, antepartum 11/10/2018 by Malachy Mood, MD No    28 wk labs, Rhogam and Flu vaccine today   Preterm labor symptoms and general obstetric precautions including but not limited to vaginal bleeding, contractions, leaking of fluid and fetal movement were reviewed in detail with the patient. Please refer to After Visit Summary for other counseling recommendations.   Return in about 2 weeks (around 04/22/2019) for rob.  Rod Can, CNM 04/08/2019 2:59 PM

## 2019-04-09 LAB — 28 WEEKS RH-PANEL
Antibody Screen: NEGATIVE
Basophils Absolute: 0 10*3/uL (ref 0.0–0.2)
Basos: 0 %
EOS (ABSOLUTE): 0.1 10*3/uL (ref 0.0–0.4)
Eos: 0 %
Gestational Diabetes Screen: 113 mg/dL (ref 65–139)
HIV Screen 4th Generation wRfx: NONREACTIVE
Hematocrit: 30.9 % — ABNORMAL LOW (ref 34.0–46.6)
Hemoglobin: 10.2 g/dL — ABNORMAL LOW (ref 11.1–15.9)
Immature Grans (Abs): 0.1 10*3/uL (ref 0.0–0.1)
Immature Granulocytes: 1 %
Lymphocytes Absolute: 3.1 10*3/uL (ref 0.7–3.1)
Lymphs: 23 %
MCH: 25.9 pg — ABNORMAL LOW (ref 26.6–33.0)
MCHC: 33 g/dL (ref 31.5–35.7)
MCV: 78 fL — ABNORMAL LOW (ref 79–97)
Monocytes Absolute: 0.6 10*3/uL (ref 0.1–0.9)
Monocytes: 4 %
Neutrophils Absolute: 9.5 10*3/uL — ABNORMAL HIGH (ref 1.4–7.0)
Neutrophils: 72 %
Platelets: 334 10*3/uL (ref 150–450)
RBC: 3.94 x10E6/uL (ref 3.77–5.28)
RDW: 14 % (ref 11.7–15.4)
RPR Ser Ql: NONREACTIVE
WBC: 13.2 10*3/uL — ABNORMAL HIGH (ref 3.4–10.8)

## 2019-04-22 ENCOUNTER — Ambulatory Visit (INDEPENDENT_AMBULATORY_CARE_PROVIDER_SITE_OTHER): Payer: BC Managed Care – PPO | Admitting: Advanced Practice Midwife

## 2019-04-22 ENCOUNTER — Encounter: Payer: Self-pay | Admitting: Advanced Practice Midwife

## 2019-04-22 ENCOUNTER — Other Ambulatory Visit: Payer: Self-pay

## 2019-04-22 VITALS — BP 118/74 | Wt 242.0 lb

## 2019-04-22 DIAGNOSIS — Z6791 Unspecified blood type, Rh negative: Secondary | ICD-10-CM

## 2019-04-22 DIAGNOSIS — O26893 Other specified pregnancy related conditions, third trimester: Secondary | ICD-10-CM

## 2019-04-22 DIAGNOSIS — O34219 Maternal care for unspecified type scar from previous cesarean delivery: Secondary | ICD-10-CM

## 2019-04-22 DIAGNOSIS — Z3A29 29 weeks gestation of pregnancy: Secondary | ICD-10-CM

## 2019-04-22 NOTE — Progress Notes (Signed)
Routine Prenatal Care Visit  Subjective  Deborah House is a 28 y.o. G3P1011 at [redacted]w[redacted]d being seen today for ongoing prenatal care.  She is currently monitored for the following issues for this low-risk pregnancy and has Anemia affecting pregnancy; Supervision of other normal pregnancy, antepartum; Pregnancy with history of cesarean section, antepartum; Obesity affecting pregnancy, antepartum; Rh negative state in antepartum period; and History of cesarean section complicating pregnancy on their problem list.  ----------------------------------------------------------------------------------- [name] prefers to wait until next visit for TDAP. Contractions: Not present. Vag. Bleeding: None.  Movement: Present. Leaking Fluid denies.  ----------------------------------------------------------------------------------- The following portions of the patient's history were reviewed and updated as appropriate: allergies, current medications, past family history, past medical history, past social history, past surgical history and problem list. Problem list updated.  Objective  Blood pressure 118/74, weight 242 lb (109.8 kg), last menstrual period 09/19/2018 Pregravid weight 210 lb (95.3 kg) Total Weight Gain 32 lb (14.5 kg) Urinalysis: Urine Protein    Urine Glucose    Fetal Status: Fetal Heart Rate (bpm): 123 Fundal Height: 30 cm Movement: Present     General:  Alert, oriented and cooperative. Patient is in no acute distress.  Skin: Skin is warm and dry. No rash noted.   Cardiovascular: Normal heart rate noted  Respiratory: Normal respiratory effort, no problems with respiration noted  Abdomen: Soft, gravid, appropriate for gestational age. Pain/Pressure: Absent     Pelvic:  Cervical exam deferred        Extremities: Normal range of motion.  Edema: None  Mental Status: Normal mood and affect. Normal behavior. Normal judgment and thought content.   Assessment    28 y.o. [id number] at [redacted]w[redacted]d by  07/02/2019, by Ultrasound presenting for routine prenatal visit  Plan   Pregnancy #3 Problems (from 09/19/18 to present)    Problem Noted Resolved   Rh negative state in antepartum period 12/09/2018 by Malachy Mood, MD No   History of cesarean section complicating pregnancy 12/28/1749 by Malachy Mood, MD No   Supervision of other normal pregnancy, antepartum 11/10/2018 by Malachy Mood, MD No   Overview Addendum 04/08/2019  2:59 PM by Rod Can, [facility] Prenatal Labs  Dating 7 week [place] Blood type: O/Negative/-- (06/17 1536)   Genetic Screen 1 Screen:    AFP:     Quad:     NIPS:Normal XX Antibody:Negative (06/17 1536)  Anatomic [place]  Rubella: 2.06 (06/17 1536) Varicella: Immune  GTT Early:               Third trimester:  RPR: Non Reactive (06/17 1536)   Rhogam [x]  28 weeks 11/18 HBsAg: Negative (06/17 1536)   Vaccines TDAP:                       Flu Shot: 11/18 HIV: Non Reactive (06/17 1536)   Baby Food                                GBS:   Contraception  Pap: 11/07/2016 NIL  CBB     CS/VBAC [ ]  Undecided   Support Person Jeanmychal           Pregnancy with history of cesarean section, antepartum 11/10/2018 by Malachy Mood, MD No       Preterm labor symptoms and general obstetric precautions including but not limited to vaginal bleeding, contractions, leaking of  fluid and fetal movement were reviewed in detail with the patient.    Return in about 2 weeks (around 05/06/2019) for rob.  Tresea Mall, CNM 04/22/2019 11:56 AM

## 2019-04-22 NOTE — Progress Notes (Signed)
No vb. No lof. TDAP NV per pt

## 2019-04-24 ENCOUNTER — Encounter: Payer: BC Managed Care – PPO | Admitting: Certified Nurse Midwife

## 2019-05-07 ENCOUNTER — Encounter: Payer: Self-pay | Admitting: Obstetrics and Gynecology

## 2019-05-07 ENCOUNTER — Other Ambulatory Visit: Payer: Self-pay

## 2019-05-07 ENCOUNTER — Ambulatory Visit (INDEPENDENT_AMBULATORY_CARE_PROVIDER_SITE_OTHER): Payer: BC Managed Care – PPO | Admitting: Obstetrics and Gynecology

## 2019-05-07 VITALS — BP 126/78 | Wt 244.0 lb

## 2019-05-07 DIAGNOSIS — O99210 Obesity complicating pregnancy, unspecified trimester: Secondary | ICD-10-CM

## 2019-05-07 DIAGNOSIS — Z23 Encounter for immunization: Secondary | ICD-10-CM

## 2019-05-07 DIAGNOSIS — Z6791 Unspecified blood type, Rh negative: Secondary | ICD-10-CM

## 2019-05-07 DIAGNOSIS — Z3A32 32 weeks gestation of pregnancy: Secondary | ICD-10-CM

## 2019-05-07 DIAGNOSIS — O26899 Other specified pregnancy related conditions, unspecified trimester: Secondary | ICD-10-CM

## 2019-05-07 DIAGNOSIS — Z6833 Body mass index (BMI) 33.0-33.9, adult: Secondary | ICD-10-CM

## 2019-05-07 DIAGNOSIS — O34219 Maternal care for unspecified type scar from previous cesarean delivery: Secondary | ICD-10-CM

## 2019-05-07 DIAGNOSIS — O99213 Obesity complicating pregnancy, third trimester: Secondary | ICD-10-CM

## 2019-05-07 DIAGNOSIS — O26893 Other specified pregnancy related conditions, third trimester: Secondary | ICD-10-CM

## 2019-05-07 DIAGNOSIS — Z3480 Encounter for supervision of other normal pregnancy, unspecified trimester: Secondary | ICD-10-CM

## 2019-05-07 DIAGNOSIS — O99013 Anemia complicating pregnancy, third trimester: Secondary | ICD-10-CM

## 2019-05-07 NOTE — Progress Notes (Signed)
Routine Prenatal Care Visit  Subjective  Deborah House is a 28 y.o. G3P1011 at [redacted]w[redacted]d being seen today for ongoing prenatal care.  She is currently monitored for the following issues for this high-risk pregnancy and has Anemia affecting pregnancy; Supervision of other normal pregnancy, antepartum; Pregnancy with history of cesarean section, antepartum; Obesity affecting pregnancy, antepartum; Rh negative state in antepartum period; History of cesarean section complicating pregnancy; and BMI 33.0-33.9,adult on their problem list.  ----------------------------------------------------------------------------------- Patient reports no complaints.   Contractions: Not present. Vag. Bleeding: None.  Movement: Present. Leaking Fluid denies.  ----------------------------------------------------------------------------------- The following portions of the patient's history were reviewed and updated as appropriate: allergies, current medications, past family history, past medical history, past social history, past surgical history and problem list. Problem list updated.  Objective  Blood pressure 126/78, weight 244 lb (110.7 kg), last menstrual period 09/19/2018, not currently breastfeeding. Pregravid weight 210 lb (95.3 kg) Total Weight Gain 34 lb (15.4 kg) Urinalysis: Urine Protein    Urine Glucose    Fetal Status: Fetal Heart Rate (bpm): 125 Fundal Height: 33 cm Movement: Present     General:  Alert, oriented and cooperative. Patient is in no acute distress.  Skin: Skin is warm and dry. No rash noted.   Cardiovascular: Normal heart rate noted  Respiratory: Normal respiratory effort, no problems with respiration noted  Abdomen: Soft, gravid, appropriate for gestational age. Pain/Pressure: Absent     Pelvic:  Cervical exam deferred        Extremities: Normal range of motion.  Edema: None  Mental Status: Normal mood and affect. Normal behavior. Normal judgment and thought content.    Assessment   28 y.o. G3P1011 at [redacted]w[redacted]d by  07/02/2019, by Ultrasound presenting for routine prenatal visit  Plan   Pregnancy #3 Problems (from 09/19/18 to present)    Problem Noted Resolved   BMI 33.0-33.9,adult 05/07/2019 by Conard Novak, MD No   Rh negative state in antepartum period 12/09/2018 by [name], MD No   History of cesarean section complicating pregnancy 12/09/2018 by [name], MD No   Supervision of other normal pregnancy, antepartum 11/10/2018 by [name], MD No   Overview Addendum 04/08/2019  2:59 PM by Tresea Mall, CNM    Clinic [facility] Prenatal Labs  Dating 7 week [place] Blood type: O/Negative/-- (06/17 1536)   Genetic Screen 1 Screen:    AFP:     Quad:     NIPS:Normal XX Antibody:Negative (06/17 1536)  Anatomic [place]  Rubella: 2.06 (06/17 1536) Varicella: Immune  GTT Early:               Third trimester:  RPR: Non Reactive (06/17 1536)   Rhogam [x]  28 weeks 11/18 HBsAg: Negative (06/17 1536)   Vaccines TDAP:                       Flu Shot: 11/18 HIV: Non Reactive (06/17 1536)   Baby Food                                GBS:   Contraception  Pap: 11/07/2016 NIL  CBB     CS/VBAC [ ]  Undecided   Support Person Jeanmychal           Pregnancy with history of cesarean section, antepartum 11/10/2018 by , MD No       Preterm labor symptoms and general obstetric precautions including but  not limited to vaginal bleeding, contractions, leaking of fluid and fetal movement were reviewed in detail with the patient. Please refer to After Visit Summary for other counseling recommendations.   - TDaP today  Return in about 2 weeks (around 05/21/2019) for Routine Prenatal Appointment.  Prentice Docker, MD, [facility], [facility] 05/07/2019 2:37 PM

## 2019-05-07 NOTE — Addendum Note (Signed)
Addended by: Brien Few on: 05/07/2019 03:35 PM   Modules accepted: Orders

## 2019-05-25 ENCOUNTER — Other Ambulatory Visit: Payer: Self-pay

## 2019-05-25 ENCOUNTER — Encounter: Payer: Self-pay | Admitting: Advanced Practice Midwife

## 2019-05-25 ENCOUNTER — Ambulatory Visit (INDEPENDENT_AMBULATORY_CARE_PROVIDER_SITE_OTHER): Payer: BC Managed Care – PPO | Admitting: Advanced Practice Midwife

## 2019-05-25 VITALS — BP 130/70 | Wt 241.0 lb

## 2019-05-25 DIAGNOSIS — Z3483 Encounter for supervision of other normal pregnancy, third trimester: Secondary | ICD-10-CM

## 2019-05-25 DIAGNOSIS — Z3480 Encounter for supervision of other normal pregnancy, unspecified trimester: Secondary | ICD-10-CM

## 2019-05-25 DIAGNOSIS — Z3A34 34 weeks gestation of pregnancy: Secondary | ICD-10-CM

## 2019-05-25 LAB — POCT URINALYSIS DIPSTICK OB: Glucose, UA: NEGATIVE

## 2019-05-25 NOTE — Progress Notes (Signed)
C/o really tired; vag muscles hurt sometimes.rj

## 2019-05-25 NOTE — Addendum Note (Signed)
Addended by: Loran Senters D on: 05/25/2019 10:50 AM   Modules accepted: Orders

## 2019-05-25 NOTE — Progress Notes (Signed)
Routine Prenatal Care Visit  Subjective  Deborah House is a 29 y.o. G3P1011 at [redacted]w[redacted]d being seen today for ongoing prenatal care.  She is currently monitored for the following issues for this low-risk pregnancy and has Anemia affecting pregnancy; Supervision of other normal pregnancy, antepartum; Pregnancy with history of cesarean section, antepartum; Obesity affecting pregnancy, antepartum; Rh negative state in antepartum period; History of cesarean section complicating pregnancy; and BMI 33.0-33.9,adult on their problem list.  ----------------------------------------------------------------------------------- Patient reports feeling tired. She is taking her PNV with Fe. She also has sore pelvic area muscles.   Contractions: Not present. Vag. Bleeding: None.  Movement: Present. Leaking Fluid denies.  ----------------------------------------------------------------------------------- The following portions of the patient's history were reviewed and updated as appropriate: allergies, current medications, past family history, past medical history, past social history, past surgical history and problem list. Problem list updated.  Objective  Blood pressure 130/70, weight 241 lb (109.3 kg), last menstrual period 09/19/2018, not currently breastfeeding. Pregravid weight 210 lb (95.3 kg) Total Weight Gain 31 lb (14.1 kg) Urinalysis: Urine Protein    Urine Glucose    Fetal Status: Fetal Heart Rate (bpm): 120 RUQ Fundal Height: 36 cm Movement: Present     General:  Alert, oriented and cooperative. Patient is in no acute distress.  Skin: Skin is warm and dry. No rash noted.   Cardiovascular: Normal heart rate noted  Respiratory: Normal respiratory effort, no problems with respiration noted  Abdomen: Soft, gravid, appropriate for gestational age. Pain/Pressure: Absent     Pelvic:  Cervical exam deferred        Extremities: Normal range of motion.  Edema: None  Mental Status: Normal mood and  affect. Normal behavior. Normal judgment and thought content.   Assessment   28 y.o. [id number] at [redacted]w[redacted]d by  07/02/2019, by Ultrasound presenting for routine prenatal visit  Plan   Pregnancy #3 Problems (from 09/19/18 to present)    Problem Noted Resolved   BMI 33.0-33.9,adult 05/07/2019 by Conard Novak, MD No   Rh negative state in antepartum period 12/09/2018 by [name], MD No   History of cesarean section complicating pregnancy 12/09/2018 by [name], MD No   Supervision of other normal pregnancy, antepartum 11/10/2018 by [name], MD No   Overview Addendum 04/08/2019  2:59 PM by Tresea Mall, CNM    Clinic [facility] Prenatal Labs  Dating 7 week [place] Blood type: O/Negative/-- (06/17 1536)   Genetic Screen 1 Screen:    AFP:     Quad:     NIPS:Normal XX Antibody:Negative (06/17 1536)  Anatomic [place]  Rubella: 2.06 (06/17 1536) Varicella: Immune  GTT Early:               Third trimester:  RPR: Non Reactive (06/17 1536)   Rhogam [x]  28 weeks 11/18 HBsAg: Negative (06/17 1536)   Vaccines TDAP:                       Flu Shot: 11/18 HIV: Non Reactive (06/17 1536)   Baby Food                                GBS:   Contraception  Pap: 11/07/2016 NIL  CBB     CS/VBAC [ ]  Undecided   Support Person Jeanmychal           Pregnancy with history of cesarean section, antepartum 11/10/2018 by , MD No  Preterm labor symptoms and general obstetric precautions including but not limited to vaginal bleeding, contractions, leaking of fluid and fetal movement were reviewed in detail with the patient.    Return in about 2 weeks (around 06/08/2019) for growth scan and rob.  Rod Can, CNM 05/25/2019 10:38 AM

## 2019-05-26 ENCOUNTER — Telehealth: Payer: Self-pay

## 2019-05-26 NOTE — Telephone Encounter (Signed)
Pt calling; fusion is no longer covered by her ins; with her last preg she went to [facility] for Iron infusions.  Can she do that again with this preg instead?  [phone] Pt aware will probably get cb tomorrow; will be at work; please send msg thru MyChart.

## 2019-05-27 ENCOUNTER — Other Ambulatory Visit: Payer: Self-pay | Admitting: Obstetrics and Gynecology

## 2019-05-27 MED ORDER — FERROUS SULFATE 325 (65 FE) MG PO TABS: 325.0000 mg | ORAL_TABLET | Freq: Every day | ORAL | 1 refills | Status: DC

## 2019-05-27 NOTE — Telephone Encounter (Signed)
Please let her know her most recent hemoglobin was only mildly low- not low enough to warrant iron infusions. We can do a spot hemoglobin check at her next visit to see if it is even lower and needs attention or if her PNV will be enough.

## 2019-06-08 ENCOUNTER — Other Ambulatory Visit: Payer: Self-pay

## 2019-06-08 ENCOUNTER — Ambulatory Visit (INDEPENDENT_AMBULATORY_CARE_PROVIDER_SITE_OTHER): Payer: BC Managed Care – PPO | Admitting: Obstetrics and Gynecology

## 2019-06-08 ENCOUNTER — Ambulatory Visit (INDEPENDENT_AMBULATORY_CARE_PROVIDER_SITE_OTHER): Payer: BC Managed Care – PPO

## 2019-06-08 ENCOUNTER — Other Ambulatory Visit (HOSPITAL_COMMUNITY)
Admission: RE | Admit: 2019-06-08 | Discharge: 2019-06-08 | Disposition: A | Discharge status: Home / Self Care | Payer: BC Managed Care – PPO | Source: Ambulatory Visit | Attending: Obstetrics and Gynecology | Admitting: Obstetrics and Gynecology

## 2019-06-08 ENCOUNTER — Encounter: Payer: Self-pay | Admitting: Obstetrics and Gynecology

## 2019-06-08 VITALS — BP 128/84 | Wt 242.0 lb

## 2019-06-08 DIAGNOSIS — Z362 Encounter for other antenatal screening follow-up: Secondary | ICD-10-CM

## 2019-06-08 DIAGNOSIS — O99210 Obesity complicating pregnancy, unspecified trimester: Secondary | ICD-10-CM

## 2019-06-08 DIAGNOSIS — O34219 Maternal care for unspecified type scar from previous cesarean delivery: Secondary | ICD-10-CM

## 2019-06-08 DIAGNOSIS — Z6833 Body mass index (BMI) 33.0-33.9, adult: Secondary | ICD-10-CM

## 2019-06-08 DIAGNOSIS — Z3480 Encounter for supervision of other normal pregnancy, unspecified trimester: Secondary | ICD-10-CM

## 2019-06-08 DIAGNOSIS — O26893 Other specified pregnancy related conditions, third trimester: Secondary | ICD-10-CM

## 2019-06-08 DIAGNOSIS — Z3A36 36 weeks gestation of pregnancy: Secondary | ICD-10-CM

## 2019-06-08 DIAGNOSIS — O99213 Obesity complicating pregnancy, third trimester: Secondary | ICD-10-CM

## 2019-06-08 DIAGNOSIS — O99013 Anemia complicating pregnancy, third trimester: Secondary | ICD-10-CM

## 2019-06-08 DIAGNOSIS — Z6791 Unspecified blood type, Rh negative: Secondary | ICD-10-CM

## 2019-06-08 DIAGNOSIS — O26899 Other specified pregnancy related conditions, unspecified trimester: Secondary | ICD-10-CM

## 2019-06-08 NOTE — Progress Notes (Signed)
Routine Prenatal Care Visit  Subjective  Deborah House is a 29 y.o. G3P1011 at [redacted]w[redacted]d being seen today for ongoing prenatal care.  She is currently monitored for the following issues for this high-risk pregnancy and has Anemia affecting pregnancy; Supervision of other normal pregnancy, antepartum; Pregnancy with history of cesarean section, antepartum; Obesity affecting pregnancy, antepartum; Rh negative state in antepartum period; History of cesarean section complicating pregnancy; and BMI 33.0-33.9,adult on their problem list.  ----------------------------------------------------------------------------------- Patient reports no complaints.   Contractions: Not present. Vag. Bleeding: None.  Movement: Present. Leaking Fluid denies.  Growth u/s: 80th%ile, AC >97%ile, AFI 15cm ----------------------------------------------------------------------------------- The following portions of the patient's history were reviewed and updated as appropriate: allergies, current medications, past family history, past medical history, past social history, past surgical history and problem list. Problem list updated.  Objective  Blood pressure 128/84, weight 242 lb (109.8 kg), last menstrual period 09/19/2018, not currently breastfeeding. Pregravid weight 210 lb (95.3 kg) Total Weight Gain 32 lb (14.5 kg) Urinalysis: Urine Protein    Urine Glucose    Fetal Status: Fetal Heart Rate (bpm): normal ([place]) Fundal Height: 37 cm Movement: Present  Presentation: Vertex  General:  Alert, oriented and cooperative. Patient is in no acute distress.  Skin: Skin is warm and dry. No rash noted.   Cardiovascular: Normal heart rate noted  Respiratory: Normal respiratory effort, no problems with respiration noted  Abdomen: Soft, gravid, appropriate for gestational age. Pain/Pressure: Present     Pelvic:  Cervical exam performed Dilation: Closed Effacement (%): 50 Station: -3  Extremities: Normal range of motion.   Edema: None  Mental Status: Normal mood and affect. Normal behavior. Normal judgment and thought content.   Imaging Results US OB FU (growth, f/u anatomy) [facility]  Result Date: 06/08/2019 Patient Name: Carmyn Hamm DOB: 09-01-1990 MRN: [id number] ULTRASOUND REPORT Location: [facility] Date of Service: 06/08/2019 Indications:growth/afi Findings: Nelda Marse[place] intrauterine pregnancy is visualized with FHR at 122 BPM. Biometrics give an (U/S) Gestational age of [redacted]w[redacted]d and an (U/S) EDD of 06/29/2019; this correlates with the clinically established Estimated Date of Delivery: 07/02/2019. Fetal presentation is Cephalic. Placenta: anterior. Grade: 2 AFI: 15.0 cm Growth percentile is 80.9%.  AC percentile is 97.7%. EFW: 3417 g (7 lb 9 oz) Impression: 1. [redacted]w[redacted]d Viable Singleton Intrauterine pregnancy previously established criteria. 2. Growth is 80.9 %ile.  AFI is 15.0 cm. Gweneth Dimitri, RT The ultrasound images and findings were reviewed by me and I agree with the above report. Prentice Docker, MD, [facility], [facility] 06/08/2019 12:11 PM       Assessment   29 y.o. [id number] at [redacted]w[redacted]d by  07/02/2019, by Ultrasound presenting for routine prenatal visit  Plan   Pregnancy #3 Problems (from 09/19/18 to present)    Problem Noted Resolved   BMI 33.0-33.9,adult 05/07/2019 by Will Bonnet, MD No   Rh negative state in antepartum period 12/09/2018 by Malachy Mood, MD No   History of cesarean section complicating pregnancy 0/35/0093 by Malachy Mood, MD No   Supervision of other normal pregnancy, antepartum 11/10/2018 by Malachy Mood, MD No   Overview Addendum 04/08/2019  2:59 PM by Rod Can, [facility] Prenatal Labs  Dating 7 week [place] Blood type: O/Negative/-- (06/17 1536)   Genetic Screen 1 Screen:    AFP:     Quad:     NIPS:Normal XX Antibody:Negative (06/17 1536)  Anatomic [place]  Rubella: 2.06 (06/17 1536) Varicella: Immune  GTT Early:  Third trimester:  RPR: Non Reactive (06/17 1536)   Rhogam [x]  28 weeks 11/18 HBsAg: Negative (06/17 1536)   Vaccines TDAP:                       Flu Shot: 11/18 HIV: Non Reactive (06/17 1536)   Baby Food                                GBS:   Contraception  Pap: 11/07/2016 NIL  CBB     CS/VBAC [ ]  Undecided   Support Person Jeanmychal           Pregnancy with history of cesarean section, antepartum 11/10/2018 by , MD No       Preterm labor symptoms and general obstetric precautions including but not limited to vaginal bleeding, contractions, leaking of fluid and fetal movement were reviewed in detail with the patient. Please refer to After Visit Summary for other counseling recommendations.   - GBS/Aptima today  Return in about 1 week (around 06/15/2019) for Routine Prenatal Appointment.  [name], MD, 06/17/2019 OB/GYN, [facility] 06/08/2019 12:22 PM

## 2019-06-10 LAB — CERVICOVAGINAL ANCILLARY ONLY
Chlamydia: NEGATIVE
Comment: NEGATIVE
Comment: NORMAL
Neisseria Gonorrhea: NEGATIVE

## 2019-06-10 LAB — STREP GP B NAA: Strep Gp B NAA: POSITIVE — AB

## 2019-06-18 ENCOUNTER — Ambulatory Visit (INDEPENDENT_AMBULATORY_CARE_PROVIDER_SITE_OTHER): Payer: BC Managed Care – PPO | Admitting: Advanced Practice Midwife

## 2019-06-18 ENCOUNTER — Encounter: Payer: Self-pay | Admitting: Advanced Practice Midwife

## 2019-06-18 ENCOUNTER — Other Ambulatory Visit: Payer: Self-pay

## 2019-06-18 VITALS — BP 126/84 | Wt 240.0 lb

## 2019-06-18 DIAGNOSIS — O99013 Anemia complicating pregnancy, third trimester: Secondary | ICD-10-CM

## 2019-06-18 DIAGNOSIS — Z3A38 38 weeks gestation of pregnancy: Secondary | ICD-10-CM

## 2019-06-18 DIAGNOSIS — O34219 Maternal care for unspecified type scar from previous cesarean delivery: Secondary | ICD-10-CM

## 2019-06-18 DIAGNOSIS — O99213 Obesity complicating pregnancy, third trimester: Secondary | ICD-10-CM

## 2019-06-18 NOTE — Progress Notes (Signed)
No vb. No lof.  

## 2019-06-18 NOTE — Patient Instructions (Signed)
Vaginal Birth After Cesarean Delivery  Vaginal birth after cesarean delivery (VBAC) is giving birth vaginally after previously delivering a baby through a cesarean section (C-section). A VBAC may be a safe option for you, depending on your health and other factors. It is important to discuss VBAC with your health care provider early in your pregnancy so you can understand the risks, benefits, and options. Having these discussions early will give you time to make your birth plan. Who are the best candidates for VBAC? The best candidates for VBAC are women who:  Have had one or two prior cesarean deliveries, and the incision made during the delivery was horizontal (low transverse).  Do not have a vertical (classical) scar on their uterus.  Have not had a tear in the wall of their uterus (uterine rupture).  Plan to have more pregnancies. A VBAC is also more likely to be successful:  In women who have previously given birth vaginally.  When labor starts by itself (spontaneously) before the due date. What are the benefits of VBAC? The benefits of delivering your baby vaginally instead of by a cesarean delivery include:  A shorter hospital stay.  A faster recovery time.  Less pain.  Avoiding risks associated with major surgery, such as infection and blood clots.  Less blood loss and less need for donated blood (transfusions). What are the risks of VBAC? The main risk of attempting a VBAC is that it may fail, forcing your health care provider to deliver your baby by a C-section. Other risks are rare and include:  Tearing (rupture) of the scar from a past cesarean delivery.  Other risks associated with vaginal deliveries. If a repeat cesarean delivery is needed, the risks include:  Blood loss.  Infection.  Blood clot.  Damage to surrounding organs.  Removal of the uterus (hysterectomy), if it is damaged.  Placenta problems in future pregnancies. What else should I know  about my options? Delivering a baby through a VBAC is similar to having a normal spontaneous vaginal delivery. Therefore, it is safe:  To try with twins.  For your health care provider to try to turn the baby from a breech position (external cephalic version) during labor.  With epidural analgesia for pain relief. Consider where you would like to deliver your baby. VBAC should be attempted in facilities where an emergency cesarean delivery can be performed. VBAC is not recommended for home births. Any changes in your health or your baby's health during your pregnancy may make it necessary to change your initial decision about VBAC. Your health care provider may recommend that you do not attempt a VBAC if:  Your baby's suspected weight is 8.8 lb (4 kg) or more.  You have preeclampsia. This is a condition that causes high blood pressure along with other symptoms, such as swelling and headaches.  You will have VBAC less than 19 months after your cesarean delivery.  You are past your due date.  You need to have labor started (induced) because your cervix is not ready for labor (unfavorable). Where to find more information  American Pregnancy Association: americanpregnancy.org  American Congress of Obstetricians and Gynecologists: acog.org Summary  Vaginal birth after cesarean delivery (VBAC) is giving birth vaginally after previously delivering a baby through a cesarean section (C-section). A VBAC may be a safe option for you, depending on your health and other factors.  Discuss VBAC with your health care provider early in your pregnancy so you can understand the risks, benefits, options, and   have plenty of time to make your birth plan.  The main risk of attempting a VBAC is that it may fail, forcing your health care provider to deliver your baby by a C-section. Other risks are rare. This information is not intended to replace advice given to you by your health care provider. Make sure  you discuss any questions you have with your health care provider. Document Revised: 09/02/2018 Document Reviewed: 08/14/2016 Elsevier Patient Education  2020 Elsevier Inc.  

## 2019-06-18 NOTE — Progress Notes (Signed)
Routine Prenatal Care Visit  Subjective  Deborah House is a 29 y.o. G3P1011 at [redacted]w[redacted]d being seen today for ongoing prenatal care.  She is currently monitored for the following issues for this low-risk pregnancy and has Anemia affecting pregnancy; Supervision of other normal pregnancy, antepartum; Pregnancy with history of cesarean section, antepartum; Obesity affecting pregnancy, antepartum; Rh negative state in antepartum period; History of cesarean section complicating pregnancy; and BMI 33.0-33.9,adult on their problem list.  ----------------------------------------------------------------------------------- Patient reports mild discomforts of 3rd trimester.   Contractions: Not present. Vag. Bleeding: None.  Movement: Present. Leaking Fluid denies.  ----------------------------------------------------------------------------------- The following portions of the patient's history were reviewed and updated as appropriate: allergies, current medications, past family history, past medical history, past social history, past surgical history and problem list. Problem list updated.  Objective  Blood pressure 126/84, weight 240 lb (108.9 kg), last menstrual period 09/19/2018, not currently breastfeeding. Pregravid weight 210 lb (95.3 kg) Total Weight Gain 30 lb (13.6 kg) Urinalysis: Urine Protein    Urine Glucose    Fetal Status: Fetal Heart Rate (bpm): 120 Fundal Height: 38 cm Movement: Present  Presentation: Vertex  General:  Alert, oriented and cooperative. Patient is in no acute distress.  Skin: Skin is warm and dry. No rash noted.   Cardiovascular: Normal heart rate noted  Respiratory: Normal respiratory effort, no problems with respiration noted  Abdomen: Soft, gravid, appropriate for gestational age. Pain/Pressure: Present     Pelvic:  Cervical exam performed Dilation: Fingertip Effacement (%): 50 Station: -2  Extremities: Normal range of motion.     Mental Status: Normal mood and  affect. Normal behavior. Normal judgment and thought content.   Assessment   28 y.o. G3P1011 at [redacted]w[redacted]d by  07/02/2019, by Ultrasound presenting for routine prenatal visit  Plan   Pregnancy #3 Problems (from 09/19/18 to present)    Problem Noted Resolved   BMI 33.0-33.9,adult 05/07/2019 by Conard Novak, MD No   Rh negative state in antepartum period 12/09/2018 by [name], MD No   History of cesarean section complicating pregnancy 12/09/2018 by [name], MD No   Supervision of other normal pregnancy, antepartum 11/10/2018 by [name], MD No   Overview Addendum 04/08/2019  2:59 PM by Tresea Mall, CNM    Clinic [facility] Prenatal Labs  Dating 7 week [place] Blood type: O/Negative/-- (06/17 1536)   Genetic Screen 1 Screen:    AFP:     Quad:     NIPS:Normal XX Antibody:Negative (06/17 1536)  Anatomic [place]  Rubella: 2.06 (06/17 1536) Varicella: Immune  GTT Early:               Third trimester:  RPR: Non Reactive (06/17 1536)   Rhogam [x]  28 weeks 11/18 HBsAg: Negative (06/17 1536)   Vaccines TDAP:                       Flu Shot: 11/18 HIV: Non Reactive (06/17 1536)   Baby Food                                GBS:   Contraception  Pap: 11/07/2016 NIL  CBB     CS/VBAC [ ]  Undecided   Support Person Jeanmychal           Pregnancy with history of cesarean section, antepartum 11/10/2018 by , MD No       Term labor symptoms and general  obstetric precautions including but not limited to vaginal bleeding, contractions, leaking of fluid and fetal movement were reviewed in detail with the patient. Please refer to After Visit Summary for other counseling recommendations.   Return in about 1 week (around 06/25/2019) for rob.  Rod Can, CNM 06/18/2019 10:57 AM

## 2019-06-24 ENCOUNTER — Ambulatory Visit (INDEPENDENT_AMBULATORY_CARE_PROVIDER_SITE_OTHER): Payer: BC Managed Care – PPO | Admitting: Obstetrics and Gynecology

## 2019-06-24 ENCOUNTER — Encounter: Payer: Self-pay | Admitting: Obstetrics and Gynecology

## 2019-06-24 ENCOUNTER — Other Ambulatory Visit: Payer: Self-pay

## 2019-06-24 VITALS — BP 135/73 | Wt 240.0 lb

## 2019-06-24 DIAGNOSIS — O99210 Obesity complicating pregnancy, unspecified trimester: Secondary | ICD-10-CM

## 2019-06-24 DIAGNOSIS — Z3A38 38 weeks gestation of pregnancy: Secondary | ICD-10-CM

## 2019-06-24 DIAGNOSIS — O99213 Obesity complicating pregnancy, third trimester: Secondary | ICD-10-CM

## 2019-06-24 DIAGNOSIS — Z6833 Body mass index (BMI) 33.0-33.9, adult: Secondary | ICD-10-CM

## 2019-06-24 DIAGNOSIS — O99013 Anemia complicating pregnancy, third trimester: Secondary | ICD-10-CM

## 2019-06-24 DIAGNOSIS — O26893 Other specified pregnancy related conditions, third trimester: Secondary | ICD-10-CM

## 2019-06-24 DIAGNOSIS — O26899 Other specified pregnancy related conditions, unspecified trimester: Secondary | ICD-10-CM

## 2019-06-24 DIAGNOSIS — Z3483 Encounter for supervision of other normal pregnancy, third trimester: Secondary | ICD-10-CM

## 2019-06-24 DIAGNOSIS — Z6791 Unspecified blood type, Rh negative: Secondary | ICD-10-CM

## 2019-06-24 DIAGNOSIS — O34219 Maternal care for unspecified type scar from previous cesarean delivery: Secondary | ICD-10-CM

## 2019-06-24 DIAGNOSIS — Z3480 Encounter for supervision of other normal pregnancy, unspecified trimester: Secondary | ICD-10-CM

## 2019-06-24 NOTE — Progress Notes (Signed)
Routine Prenatal Care Visit Subjective  Deborah House is a 29 y.o. G3P1011 at [redacted]w[redacted]d being seen today for ongoing prenatal care.  She is currently monitored for the following issues for this high-risk pregnancy and has Anemia affecting pregnancy; Supervision of other normal pregnancy, antepartum; Pregnancy with history of cesarean section, antepartum; Obesity affecting pregnancy, antepartum; Rh negative state in antepartum period; History of cesarean section complicating pregnancy; and BMI 33.0-33.9,adult on their problem list.  ----------------------------------------------------------------------------------- Patient reports no complaints.   Contractions: Irregular. Vag. Bleeding: None.  Movement: (!) Decreased. Leaking Fluid denies.  ----------------------------------------------------------------------------------- The following portions of the patient's history were reviewed and updated as appropriate: allergies, current medications, past family history, past medical history, past social history, past surgical history and problem list. Problem list updated.  Objective  Blood pressure 135/73, weight 240 lb (108.9 kg), last menstrual period 09/19/2018, not currently breastfeeding. Pregravid weight 210 lb (95.3 kg) Total Weight Gain 30 lb (13.6 kg) Urinalysis: Urine Protein    Urine Glucose    Fetal Status: Fetal Heart Rate (bpm): 115 Fundal Height: 40 cm Movement: (!) Decreased  Presentation: Vertex  General:  Alert, oriented and cooperative. Patient is in no acute distress.  Skin: Skin is warm and dry. No rash noted.   Cardiovascular: Normal heart rate noted  Respiratory: Normal respiratory effort, no problems with respiration noted  Abdomen: Soft, gravid, appropriate for gestational age. Pain/Pressure: Absent     Pelvic:  Cervical exam deferred Dilation: Closed Effacement (%): 50 Station: -3  Extremities: Normal range of motion.  Edema: None  Mental Status: Normal mood and  affect. Normal behavior. Normal judgment and thought content.   Bedside BPP Scoring: Movement: 2/2  Tone: 2/2  Breathing: 2/2  AFI: 2/2 NST: 8/8  Assessment   28 y.o. G3P1011 at [redacted]w[redacted]d by  07/02/2019, by Ultrasound presenting for routine prenatal visit  Plan   Pregnancy #3 Problems (from 09/19/18 to present)    Problem Noted Resolved   BMI 33.0-33.9,adult 05/07/2019 by Conard Novak, MD No   Rh negative state in antepartum period 12/09/2018 by [name], MD No   History of cesarean section complicating pregnancy 12/09/2018 by [name], MD No   Supervision of other normal pregnancy, antepartum 11/10/2018 by [name], MD No   Overview Addendum 04/08/2019  2:59 PM by Tresea Mall, CNM    [facility]inic [facility] Prenatal Labs  Dating 7 week [place] Blood type: O/Negative/-- (06/17 1536)   Genetic Screen 1 Screen:    AFP:     Quad:     NIPS:Normal XX Antibody:Negative (06/17 1536)  Anatomic [place]  Rubella: 2.06 (06/17 1536) Varicella: Immune  GTT Early:               Third trimester:  RPR: Non Reactive (06/17 1536)   Rhogam [x]  28 weeks 11/18 HBsAg: Negative (06/17 1536)   Vaccines TDAP:                       Flu Shot: 11/18 HIV: Non Reactive (06/17 1536)   Baby Food                                GBS:   Contraception  Pap: 11/07/2016 NIL  CBB     CS/VBAC [ ]  Undecided   Support Person Jeanmychal           Pregnancy with history of cesarean section, antepartum 11/10/2018 by ,  Andreas, MD No       Term labor symptoms and general obstetric precautions including but not limited to vaginal bleeding, contractions, leaking of fluid and fetal movement were reviewed in detail with the patient. Please refer to After Visit Summary for other counseling recommendations.   Reassured patient regarding fetal movement. Strict kick counts reiterated to her. Discussed not waiting for appointments to be seen for decreased fetal movement. She was instructed to go to L&D if  she felt a decrease or absence of fetal movement.  Return in about 1 week (around 07/01/2019) for Routine Prenatal Appointment.  Prentice Docker, MD, [facility], [facility] 06/24/2019 1:18 PM

## 2019-07-02 ENCOUNTER — Other Ambulatory Visit: Payer: Self-pay

## 2019-07-02 ENCOUNTER — Ambulatory Visit (INDEPENDENT_AMBULATORY_CARE_PROVIDER_SITE_OTHER): Payer: BC Managed Care – PPO | Admitting: Obstetrics and Gynecology

## 2019-07-02 ENCOUNTER — Encounter: Payer: Self-pay | Admitting: Obstetrics and Gynecology

## 2019-07-02 ENCOUNTER — Telehealth: Payer: Self-pay | Admitting: Obstetrics & Gynecology

## 2019-07-02 VITALS — BP 127/75 | Wt 239.0 lb

## 2019-07-02 DIAGNOSIS — O34219 Maternal care for unspecified type scar from previous cesarean delivery: Secondary | ICD-10-CM

## 2019-07-02 DIAGNOSIS — O26899 Other specified pregnancy related conditions, unspecified trimester: Secondary | ICD-10-CM

## 2019-07-02 DIAGNOSIS — Z6791 Unspecified blood type, Rh negative: Secondary | ICD-10-CM

## 2019-07-02 DIAGNOSIS — Z6833 Body mass index (BMI) 33.0-33.9, adult: Secondary | ICD-10-CM

## 2019-07-02 DIAGNOSIS — Z3A40 40 weeks gestation of pregnancy: Secondary | ICD-10-CM

## 2019-07-02 DIAGNOSIS — O360130 Maternal care for anti-D [Rh] antibodies, third trimester, not applicable or unspecified: Secondary | ICD-10-CM

## 2019-07-02 DIAGNOSIS — O99013 Anemia complicating pregnancy, third trimester: Secondary | ICD-10-CM

## 2019-07-02 DIAGNOSIS — O0993 Supervision of high risk pregnancy, unspecified, third trimester: Secondary | ICD-10-CM

## 2019-07-02 DIAGNOSIS — O99213 Obesity complicating pregnancy, third trimester: Secondary | ICD-10-CM

## 2019-07-02 DIAGNOSIS — Z3480 Encounter for supervision of other normal pregnancy, unspecified trimester: Secondary | ICD-10-CM

## 2019-07-02 DIAGNOSIS — O99210 Obesity complicating pregnancy, unspecified trimester: Secondary | ICD-10-CM

## 2019-07-02 NOTE — Progress Notes (Addendum)
Routine Prenatal Care Visit  Subjective  Deborah House is a 29 y.o. G3P1011 at [redacted]w[redacted]d being seen today for ongoing prenatal care.  She is currently monitored for the following issues for this high-risk pregnancy and has Anemia affecting pregnancy; Supervision of other normal pregnancy, antepartum; Pregnancy with history of cesarean section, antepartum; Obesity affecting pregnancy, antepartum; Rh negative state in antepartum period; History of cesarean section complicating pregnancy; and BMI 33.0-33.9,adult on their problem list.  ----------------------------------------------------------------------------------- Patient reports no complaints.   Contractions: Irregular. Vag. Bleeding: None.  Movement: Present. Leaking Fluid denies.  ----------------------------------------------------------------------------------- The following portions of the patient's history were reviewed and updated as appropriate: allergies, current medications, past family history, past medical history, past social history, past surgical history and problem list. Problem list updated.  Objective  Blood pressure 127/75, weight 239 lb (108.4 kg), last menstrual period 09/19/2018. Pregravid weight 210 lb (95.3 kg) Total Weight Gain 29 lb (13.2 kg) Urinalysis: Urine Protein    Urine Glucose    Fetal Status: Fetal Heart Rate (bpm): 135   Movement: Present  Presentation: Vertex  General:  Alert, oriented and cooperative. Patient is in no acute distress.  Skin: Skin is warm and dry. No rash noted.   Cardiovascular: Normal heart rate noted  Respiratory: Normal respiratory effort, no problems with respiration noted  Abdomen: Soft, gravid, appropriate for gestational age. Pain/Pressure: Absent     Pelvic:  Cervical exam performed Dilation: Closed Effacement (%): 50 Station: -3  Extremities: Normal range of motion.  Edema: None  Mental Status: Normal mood and affect. Normal behavior. Normal judgment and thought content.    Assessment   28 y.o. [id number] at [redacted]w[redacted]d by  07/02/2019, by Ultrasound presenting for routine prenatal visit  Plan   Pregnancy #3 Problems (from 09/19/18 to present)    Problem Noted Resolved   BMI 33.0-33.9,adult 05/07/2019 by Conard Novak, MD No   Rh negative state in antepartum period 12/09/2018 by [name], MD No   History of cesarean section complicating pregnancy 12/09/2018 by [name], MD No   Supervision of other normal pregnancy, antepartum 11/10/2018 by [name], MD No   Overview Addendum 04/08/2019  2:59 PM by Tresea Mall, CNM    [facility] Prenatal Labs  Dating 7 week [place] Blood type: O/Negative/-- (06/17 1536)   Genetic Screen 1 Screen:    AFP:     Quad:     NIPS:Normal XX Antibody:Negative (06/17 1536)  Anatomic [place]  Rubella: 2.06 (06/17 1536) Varicella: Immune  GTT Early:               Third trimester:  RPR: Non Reactive (06/17 1536)   Rhogam [x]  28 weeks 11/18 HBsAg: Negative (06/17 1536)   Vaccines TDAP:                       Flu Shot: 11/18 HIV: Non Reactive (06/17 1536)   Baby Food                                GBS:   Contraception  Pap: 11/07/2016 NIL  CBB     CS/VBAC [ ]  Undecided   Support Person Jeanmychal           Pregnancy with history of cesarean section, antepartum 11/10/2018 by , MD No       Term labor symptoms and general obstetric precautions including but not limited to vaginal bleeding,  contractions, leaking of fluid and fetal movement were reviewed in detail with the patient. Please refer to After Visit Summary for other counseling recommendations.   - working on scheduling a c-section for 2/16 or 2/18, if not in labor prior to that. I was unable to get a firm date from L&D due to need for approval of surgery date/time. Will f/u with patient once this is secured. Update: surgery schedule 2/18. Cleared by L&D  Return if symptoms worsen or fail to improve.  Prentice Docker, MD,  [facility], [facility] 07/02/2019 10:47 AM

## 2019-07-02 NOTE — Telephone Encounter (Signed)
Patient is aware of H&P at [facility] on 2/15 @ 4:10pm w/ Dr. Tiburcio Pea, Pre-admit testing to be scheduled, COVID testing on 2/16, and OR on 07/09/19. Patient is aware to quarantine after COVID testing.

## 2019-07-02 NOTE — Telephone Encounter (Signed)
-----   Message from Conard Novak, MD sent at 07/02/2019 11:55 AM EST ----- Regarding: schedule surgery Surgery Booking Request Patient Full Name:  Deborah House  MRN: [id number]  DOB: [birth year]  Surgeon: Annamarie Major, MD  Requested Surgery Date and Time: 07/09/2019 Primary Diagnosis AND Code: History of cesarean delivery, desires repeat Secondary Diagnosis and Code:  Surgical Procedure: Cesarean Section L&D Notification: Yes Admission Status: surgery admit Length of Surgery: 60 minutes Special Case Needs: No H&P: Yes (could do day of surgery if OK with Dr. Tiburcio Pea) Phone Interview???:  Yes Interpreter: No Language:  Medical Clearance:  No Special Scheduling Instructions: none Any known health/anesthesia issues, diabetes, sleep apnea, latex allergy, defibrillator/pacemaker?: No Acuity: P2   (P1 highest, P2 delay may cause harm, P3 low, elective gyn, P4 lowest)

## 2019-07-06 ENCOUNTER — Other Ambulatory Visit: Payer: Self-pay

## 2019-07-06 ENCOUNTER — Encounter: Payer: Self-pay | Admitting: Obstetrics & Gynecology

## 2019-07-06 ENCOUNTER — Ambulatory Visit (INDEPENDENT_AMBULATORY_CARE_PROVIDER_SITE_OTHER): Payer: BC Managed Care – PPO | Admitting: Obstetrics & Gynecology

## 2019-07-06 VITALS — BP 120/80 | Wt 240.0 lb

## 2019-07-06 DIAGNOSIS — Z3480 Encounter for supervision of other normal pregnancy, unspecified trimester: Secondary | ICD-10-CM

## 2019-07-06 DIAGNOSIS — Z3483 Encounter for supervision of other normal pregnancy, third trimester: Secondary | ICD-10-CM

## 2019-07-06 DIAGNOSIS — O34219 Maternal care for unspecified type scar from previous cesarean delivery: Secondary | ICD-10-CM

## 2019-07-06 DIAGNOSIS — O480 Post-term pregnancy: Secondary | ICD-10-CM

## 2019-07-06 DIAGNOSIS — Z3A40 40 weeks gestation of pregnancy: Secondary | ICD-10-CM

## 2019-07-06 NOTE — Progress Notes (Signed)
PRE-OPERATIVE HISTORY AND PHYSICAL EXAM  HPI:  Deborah House is a 29 y.o. G3P1011.  Patient's last menstrual period was 09/19/2018 (exact date).  [redacted]w[redacted]d Estimated Date of Delivery: 07/02/19  She is being admitted for Elective repeat  PMHx: She  has a past medical history of Abdominal pain, Amenorrhea, Anemia, Chlamydia (2012), Family history of ovarian cancer (07/2015), Hemorrhoids, Low-lying placenta in third trimester, and Missed abortion (06/01/2014). Also,  has a past surgical history that includes Dilation and curettage of uterus (06/01/2014); Eye surgery (Left); and Cesarean section (N/A, 06/27/2017)., family history includes Anemia in her mother and sister; Cancer in her maternal grandmother; Diabetes in her maternal grandmother; Ovarian cancer (age of onset: 22) in her maternal grandmother.,  reports that she has never smoked. She has never used smokeless tobacco. She reports that she does not drink alcohol or use drugs. OB History  Gravida Para Term Preterm AB Living  3 1 1   1 1   SAB TAB Ectopic Multiple Live Births  1     0 1    # Outcome Date GA Lbr Len/2nd Weight Sex Delivery Anes PTL Lv  3 Current           2 Term 06/27/17 [redacted]w[redacted]d  7 lb 10.4 oz (3.47 kg) F CS-LTranv Spinal  LIV  1 SAB 06/01/14             Birth Comments: D&C  Patient denies any other pertinent gynecologic issues. See prenatal record for more complete H&P   Current Outpatient Medications:  .  Prenatal MV-Min-FA-Omega-3 (PRENATAL GUMMIES/DHA & FA) 0.4-32.5 MG CHEW, Chew 1 tablet by mouth daily. , Disp: , Rfl:  .  acetaminophen (TYLENOL) 500 MG tablet, Take 500-1,000 mg by mouth every 6 (six) hours as needed (for pain.)., Disp: , Rfl:  Also, is allergic to ibuprofen.  Review of Systems  Constitutional: Negative for chills, fever and malaise/fatigue.  HENT: Negative for congestion, sinus pain and sore throat.   Eyes: Negative for blurred vision and pain.  Respiratory: Negative for cough and wheezing.    Cardiovascular: Negative for chest pain and leg swelling.  Gastrointestinal: Positive for abdominal pain. Negative for constipation, diarrhea, heartburn, nausea and vomiting.  Genitourinary: Negative for dysuria, frequency, hematuria and urgency.  Musculoskeletal: Negative for back pain, joint pain, myalgias and neck pain.  Skin: Negative for itching and rash.  Neurological: Negative for dizziness, tremors and weakness.  Endo/Heme/Allergies: Does not bruise/bleed easily.  Psychiatric/Behavioral: Negative for depression. The patient is not nervous/anxious and does not have insomnia.     Objective: BP 120/80   Wt 240 lb (108.9 kg)   LMP 09/19/2018 (Exact Date)   BMI 38.74 kg/m  Filed Weights   07/06/19 1613  Weight: 240 lb (108.9 kg)   Physical Exam Constitutional:      General: She is not in acute distress.    Appearance: She is well-developed.  Genitourinary:     Vulva, urethra, bladder and vagina normal.     Cervix is parous.     Uterus is enlarged.     Genitourinary Comments: Cx FT, 30%, -3  HENT:     Head: Normocephalic and atraumatic. No laceration.     Right Ear: Hearing normal.     Left Ear: Hearing normal.     Mouth/Throat:     Pharynx: Uvula midline.  Eyes:     Pupils: Pupils are equal, round, and reactive to light.  Neck:     Thyroid: No  thyromegaly.  Cardiovascular:     Rate and Rhythm: Normal rate and regular rhythm.     Heart sounds: No murmur. No friction rub. No gallop.   Pulmonary:     Effort: Pulmonary effort is normal. No respiratory distress.     Breath sounds: Normal breath sounds. No wheezing.  Chest:     Breasts:        Right: No mass, skin change or tenderness.        Left: No mass, skin change or tenderness.  Abdominal:     General: Bowel sounds are normal. There is no distension.     Palpations: Abdomen is soft.     Tenderness: There is no abdominal tenderness. There is no rebound.  Musculoskeletal:        General: Normal range of motion.      Cervical back: Normal range of motion and neck supple.  Neurological:     Mental Status: She is alert and oriented to person, place, and time.     Cranial Nerves: No cranial nerve deficit.  Skin:    General: Skin is warm and dry.  Psychiatric:        Judgment: Judgment normal.  Vitals reviewed.     Assessment: 1. [redacted] weeks gestation of pregnancy   2. Pregnancy with history of cesarean section, antepartum   3. Supervision of other normal pregnancy, antepartum     PLAN: 1.  Cesarean Delivery as Scheduled.  Patient will undergo surgical management with Cesarean Section.   The risks of surgery were discussed in detail with the patient including but not limited to: bleeding which may require transfusion or reoperation; infection which may require antibiotics; injury to surrounding organs which may involve bowel, bladder, ureters ; need for additional procedures including laparoscopy or laparotomy; thromboembolic phenomenon, surgical site problems and other postoperative/anesthesia complications. Likelihood of success in alleviating the patient's condition was discussed. Routine postoperative instructions will be reviewed with the patient and her family in detail after surgery.  The patient concurred with the proposed plan, giving informed written consent for the surgery.  Patient will be NPO procedure.  Preoperative prophylactic antibiotics, as necessary, and SCDs ordered on call to the OR.  [facility] Prenatal Labs  Dating 7 week [place] Blood type: O/Negative/-- (06/17 1536)   Genetic Screen    NIPS:Normal XX Antibody:Negative (06/17 1536)  Anatomic [place] [facility] Rubella: 2.06 (06/17 1536) Varicella: Immune  GTT nml RPR: Non Reactive (06/17 1536)   Rhogam [x]  28 weeks 11/18 HBsAg: Negative (06/17 1536)   Vaccines TDAP:12/17                       Flu Shot: 11/18 HIV: Non Reactive (06/17 1536)   Baby Food                     Breast           GBS: POS  Contraception  Uncertain Pap: 3/55/7322  NIL  CBB  No   CS/VBAC [x] VBAC if labors, CS 41 weeks if not   Support Person Lorinda Creed, M.D. 07/06/2019 4:50 PM

## 2019-07-06 NOTE — H&P (View-Only) (Signed)
    PRE-OPERATIVE HISTORY AND PHYSICAL EXAM  HPI:  Deborah House is a 29 y.o. G3P1011.  Patient's last menstrual period was 09/19/2018 (exact date).  [redacted]w[redacted]d Estimated Date of Delivery: 07/02/19  She is being admitted for Elective repeat  PMHx: She  has a past medical history of Abdominal pain, Amenorrhea, Anemia, Chlamydia (2012), Family history of ovarian cancer (07/2015), Hemorrhoids, Low-lying placenta in third trimester, and Missed abortion (06/01/2014). Also,  has a past surgical history that includes Dilation and curettage of uterus (06/01/2014); Eye surgery (Left); and Cesarean section (N/A, 06/27/2017)., family history includes Anemia in her mother and sister; Cancer in her maternal grandmother; Diabetes in her maternal grandmother; Ovarian cancer (age of onset: 60) in her maternal grandmother.,  reports that she has never smoked. She has never used smokeless tobacco. She reports that she does not drink alcohol or use drugs. OB History  Gravida Para Term Preterm AB Living  3 1 1   1 1  SAB TAB Ectopic Multiple Live Births  1     0 1    # Outcome Date GA Lbr Len/2nd Weight Sex Delivery Anes PTL Lv  3 Current           2 Term 06/27/17 [redacted]w[redacted]d  7 lb 10.4 oz (3.47 kg) F CS-LTranv Spinal  LIV  1 SAB 06/01/14             Birth Comments: D&C  Patient denies any other pertinent gynecologic issues. See prenatal record for more complete H&P   Current Outpatient Medications:  .  Prenatal MV-Min-FA-Omega-3 (PRENATAL GUMMIES/DHA & FA) 0.4-32.5 MG CHEW, Chew 1 tablet by mouth daily. , Disp: , Rfl:  .  acetaminophen (TYLENOL) 500 MG tablet, Take 500-1,000 mg by mouth every 6 (six) hours as needed (for pain.)., Disp: , Rfl:  Also, is allergic to ibuprofen.  Review of Systems  Constitutional: Negative for chills, fever and malaise/fatigue.  HENT: Negative for congestion, sinus pain and sore throat.   Eyes: Negative for blurred vision and pain.  Respiratory: Negative for cough and wheezing.    Cardiovascular: Negative for chest pain and leg swelling.  Gastrointestinal: Positive for abdominal pain. Negative for constipation, diarrhea, heartburn, nausea and vomiting.  Genitourinary: Negative for dysuria, frequency, hematuria and urgency.  Musculoskeletal: Negative for back pain, joint pain, myalgias and neck pain.  Skin: Negative for itching and rash.  Neurological: Negative for dizziness, tremors and weakness.  Endo/Heme/Allergies: Does not bruise/bleed easily.  Psychiatric/Behavioral: Negative for depression. The patient is not nervous/anxious and does not have insomnia.     Objective: BP 120/80   Wt 240 lb (108.9 kg)   LMP 09/19/2018 (Exact Date)   BMI 38.74 kg/m  Filed Weights   07/06/19 1613  Weight: 240 lb (108.9 kg)   Physical Exam Constitutional:      General: She is not in acute distress.    Appearance: She is well-developed.  Genitourinary:     Vulva, urethra, bladder and vagina normal.     Cervix is parous.     Uterus is enlarged.     Genitourinary Comments: Cx FT, 30%, -3  HENT:     Head: Normocephalic and atraumatic. No laceration.     Right Ear: Hearing normal.     Left Ear: Hearing normal.     Mouth/Throat:     Pharynx: Uvula midline.  Eyes:     Pupils: Pupils are equal, round, and reactive to light.  Neck:     Thyroid: No   thyromegaly.  Cardiovascular:     Rate and Rhythm: Normal rate and regular rhythm.     Heart sounds: No murmur. No friction rub. No gallop.   Pulmonary:     Effort: Pulmonary effort is normal. No respiratory distress.     Breath sounds: Normal breath sounds. No wheezing.  Chest:     Breasts:        Right: No mass, skin change or tenderness.        Left: No mass, skin change or tenderness.  Abdominal:     General: Bowel sounds are normal. There is no distension.     Palpations: Abdomen is soft.     Tenderness: There is no abdominal tenderness. There is no rebound.  Musculoskeletal:        General: Normal range of motion.      Cervical back: Normal range of motion and neck supple.  Neurological:     Mental Status: She is alert and oriented to person, place, and time.     Cranial Nerves: No cranial nerve deficit.  Skin:    General: Skin is warm and dry.  Psychiatric:        Judgment: Judgment normal.  Vitals reviewed.     Assessment: 1. [redacted] weeks gestation of pregnancy   2. Pregnancy with history of cesarean section, antepartum   3. Supervision of other normal pregnancy, antepartum     PLAN: 1.  Cesarean Delivery as Scheduled.  Patient will undergo surgical management with Cesarean Section.   The risks of surgery were discussed in detail with the patient including but not limited to: bleeding which may require transfusion or reoperation; infection which may require antibiotics; injury to surrounding organs which may involve bowel, bladder, ureters ; need for additional procedures including laparoscopy or laparotomy; thromboembolic phenomenon, surgical site problems and other postoperative/anesthesia complications. Likelihood of success in alleviating the patient's condition was discussed. Routine postoperative instructions will be reviewed with the patient and her family in detail after surgery.  The patient concurred with the proposed plan, giving informed written consent for the surgery.  Patient will be NPO procedure.  Preoperative prophylactic antibiotics, as necessary, and SCDs ordered on call to the OR.  [facility] Prenatal Labs  Dating 7 week US Blood type: O/Negative/-- (06/17 1536)   Genetic Screen    NIPS:Normal XX Antibody:Negative (06/17 1536)  Anatomic US [facility] Rubella: 2.06 (06/17 1536) Varicella: Immune  GTT nml RPR: Non Reactive (06/17 1536)   Rhogam [x] 28 weeks 11/18 HBsAg: Negative (06/17 1536)   Vaccines TDAP:12/17                       Flu Shot: 11/18 HIV: Non Reactive (06/17 1536)   Baby Food                     Breast           GBS: POS  Contraception  Uncertain Pap: 11/07/2016  NIL  CBB  No   CS/VBAC [x]VBAC if labors, CS 41 weeks if not   Support Person Jeanmychal     Paul Tarl Cephas, M.D. 07/06/2019 4:50 PM  

## 2019-07-06 NOTE — Patient Instructions (Signed)
Cesarean Delivery, Care After This sheet gives you information about how to care for yourself after your procedure. Your health care provider may also give you more specific instructions. If you have problems or questions, contact your health care provider. What can I expect after the procedure? After the procedure, it is common to have:  A small amount of blood or clear fluid coming from the incision.  Some redness, swelling, and pain in your incision area.  Some abdominal pain and soreness.  Vaginal bleeding (lochia). Even though you did not have a vaginal delivery, you will still have vaginal bleeding and discharge.  Pelvic cramps.  Fatigue. You may have pain, swelling, and discomfort in the tissue between your vagina and your anus (perineum) if:  Your C-section was unplanned, and you were allowed to labor and push.  An incision was made in the area (episiotomy) or the tissue tore during attempted vaginal delivery. Follow these instructions at home: Incision care   Follow instructions from your health care provider about how to take care of your incision. Make sure you: ? Wash your hands with soap and water before you change your bandage (dressing). If soap and water are not available, use hand sanitizer. ? If you have a dressing, change it or remove it as told by your health care provider. ? Leave stitches (sutures), skin staples, skin glue, or adhesive strips in place. These skin closures may need to stay in place for 2 weeks or longer. If adhesive strip edges start to loosen and curl up, you may trim the loose edges. Do not remove adhesive strips completely unless your health care provider tells you to do that.  Check your incision area every day for signs of infection. Check for: ? More redness, swelling, or pain. ? More fluid or blood. ? Warmth. ? Pus or a bad smell.  Do not take baths, swim, or use a hot tub until your health care provider says it's okay. Ask your health  care provider if you can take showers.  When you cough or sneeze, hug a pillow. This helps with pain and decreases the chance of your incision opening up (dehiscing). Do this until your incision heals. Medicines  Take over-the-counter and prescription medicines only as told by your health care provider.  If you were prescribed an antibiotic medicine, take it as told by your health care provider. Do not stop taking the antibiotic even if you start to feel better.  Do not drive or use heavy machinery while taking prescription pain medicine. Lifestyle  Do not drink alcohol. This is especially important if you are breastfeeding or taking pain medicine.  Do not use any products that contain nicotine or tobacco, such as cigarettes, e-cigarettes, and chewing tobacco. If you need help quitting, ask your health care provider. Eating and drinking  Drink at least 8 eight-ounce glasses of water every day unless told not to by your health care provider. If you breastfeed, you may need to drink even more water.  Eat high-fiber foods every day. These foods may help prevent or relieve constipation. High-fiber foods include: ? Whole grain cereals and breads. ? Brown rice. ? Beans. ? Fresh fruits and vegetables. Activity   If possible, have someone help you care for your baby and help with household activities for at least a few days after you leave the hospital.  Return to your normal activities as told by your health care provider. Ask your health care provider what activities are safe for   you.  Rest as much as possible. Try to rest or take a nap while your baby is sleeping.  Do not lift anything that is heavier than 10 lbs (4.5 kg), or the limit that you were told, until your health care provider says that it is safe.  Talk with your health care provider about when you can engage in sexual activity. This may depend on your: ? Risk of infection. ? How fast you heal. ? Comfort and desire to  engage in sexual activity. General instructions  Do not use tampons or douches until your health care provider approves.  Wear loose, comfortable clothing and a supportive and well-fitting bra.  Keep your perineum clean and dry. Wipe from front to back when you use the toilet.  If you pass a blood clot, save it and call your health care provider to discuss. Do not flush blood clots down the toilet before you get instructions from your health care provider.  Keep all follow-up visits for you and your baby as told by your health care provider. This is important. Contact a health care provider if:  You have: ? A fever. ? Bad-smelling vaginal discharge. ? Pus or a bad smell coming from your incision. ? Difficulty or pain when urinating. ? A sudden increase or decrease in the frequency of your bowel movements. ? More redness, swelling, or pain around your incision. ? More fluid or blood coming from your incision. ? A rash. ? Nausea. ? Little or no interest in activities you used to enjoy. ? Questions about caring for yourself or your baby.  Your incision feels warm to the touch.  Your breasts turn red or become painful or hard.  You feel unusually sad or worried.  You vomit.  You pass a blood clot from your vagina.  You urinate more than usual.  You are dizzy or light-headed. Get help right away if:  You have: ? Pain that does not go away or get better with medicine. ? Chest pain. ? Difficulty breathing. ? Blurred vision or spots in your vision. ? Thoughts about hurting yourself or your baby. ? New pain in your abdomen or in one of your legs. ? A severe headache.  You faint.  You bleed from your vagina so much that you fill more than one sanitary pad in one hour. Bleeding should not be heavier than your heaviest period. Summary  After the procedure, it is common to have pain at your incision site, abdominal cramping, and slight bleeding from your vagina.  Check  your incision area every day for signs of infection.  Tell your health care provider about any unusual symptoms.  Keep all follow-up visits for you and your baby as told by your health care provider. This information is not intended to replace advice given to you by your health care provider. Make sure you discuss any questions you have with your health care provider. Document Revised: 11/13/2017 Document Reviewed: 11/13/2017 Elsevier Patient Education  2020 [employer]  

## 2019-07-07 ENCOUNTER — Encounter
Admission: RE | Admit: 2019-07-07 | Discharge: 2019-07-07 | Disposition: A | Discharge status: Home / Self Care | Payer: BC Managed Care – PPO | Source: Ambulatory Visit | Attending: Obstetrics & Gynecology | Admitting: Obstetrics & Gynecology

## 2019-07-07 NOTE — Patient Instructions (Signed)
Your procedure is scheduled on: Thurs 2/18 Report to [facility] and call [phone] and someone will escort you to L&D.   Remember: Instructions that are not followed completely may result in serious medical risk,  up to and including death, or upon the discretion of your surgeon and anesthesiologist your  surgery may need to be rescheduled.     _X__ 1. Do not eat food after midnight the night before your procedure.                 No gum chewing or hard candies. You may drink clear liquids up to 2 hours                 before you are scheduled to arrive for your surgery- DO not drink clear                 liquids within 2 hours of the start of your surgery.                 Clear Liquids include:  water, apple juice without pulp, clear Gatorade, G2 or                  Gatorade Zero (avoid Red/Purple/Blue), Black Coffee or Tea (Do not add                 anything to coffee or tea). __x___2.   Complete the carbohydrate drink provided to you, 2 hours before arrival.  __X__2.  On the morning of surgery brush your teeth with toothpaste and water, you                may rinse your mouth with mouthwash if you wish.  Do not swallow any toothpaste of mouthwash.     ___ 3.  No Alcohol for 24 hours before or after surgery.   ___ 4.  Do Not Smoke or use e-cigarettes For 24 Hours Prior to Your Surgery.                 Do not use any chewable tobacco products for at least 6 hours prior to                 surgery.  ____  5.  Bring all medications with you on the day of surgery if instructed.   __x__  6.  Notify your doctor if there is any change in your medical condition      (cold, fever, infections).     Do not wear jewelry, make-up, hairpins, clips or nail polish. Do not wear lotions, powders, or perfumes. You may wear deodorant. Do not shave 48 hours prior to surgery. Men may shave face and neck. Do not bring valuables to the hospital.    [facility] is not  responsible for any belongings or valuables.  Contacts, dentures or bridgework may not be worn into surgery. Leave your suitcase in the car. After surgery it may be brought to your room. For patients admitted to the hospital, discharge time is determined by your treatment team.   Patients discharged the day of surgery will not be allowed to drive home.   Make arrangements for someone to be with you for the first 24 hours of your Same Day Discharge.    Please read over the following fact sheets that you were given:      __x__ Take these medicines the morning of surgery with A SIP OF WATER:    1. May take nausea  medicine   2.   3.   4.  5.  6.  ____ Fleet Enema (as directed)   __x__ Use CHG Soap (or wipes) as directed  ____ Use Benzoyl Peroxide Gel as instructed  ____ Use inhalers on the day of surgery  ____ Stop metformin 2 days prior to surgery    ____ Take 1/2 of usual insulin dose the night before surgery. No insulin the morning          of surgery.   ____ Stop Coumadin/Plavix/aspirin   ____ Stop Anti-inflammatories on    ____ Stop supplements until after surgery.    ____ Bring C-Pap to the hospital.

## 2019-07-08 ENCOUNTER — Other Ambulatory Visit
Admission: RE | Admit: 2019-07-08 | Discharge: 2019-07-08 | Disposition: A | Discharge status: Home / Self Care | Payer: BC Managed Care – PPO | Source: Ambulatory Visit | Attending: Obstetrics & Gynecology | Admitting: Obstetrics & Gynecology

## 2019-07-08 ENCOUNTER — Other Ambulatory Visit: Payer: Self-pay

## 2019-07-08 DIAGNOSIS — Z3A41 41 weeks gestation of pregnancy: Secondary | ICD-10-CM | POA: Diagnosis not present

## 2019-07-08 DIAGNOSIS — O99824 Streptococcus B carrier state complicating childbirth: Secondary | ICD-10-CM | POA: Diagnosis not present

## 2019-07-08 DIAGNOSIS — O34211 Maternal care for low transverse scar from previous cesarean delivery: Secondary | ICD-10-CM | POA: Diagnosis not present

## 2019-07-08 DIAGNOSIS — Z3A40 40 weeks gestation of pregnancy: Secondary | ICD-10-CM | POA: Diagnosis not present

## 2019-07-08 DIAGNOSIS — O480 Post-term pregnancy: Secondary | ICD-10-CM | POA: Diagnosis not present

## 2019-07-08 DIAGNOSIS — Z01812 Encounter for preprocedural laboratory examination: Secondary | ICD-10-CM | POA: Insufficient documentation

## 2019-07-08 DIAGNOSIS — O134 Gestational [pregnancy-induced] hypertension without significant proteinuria, complicating childbirth: Secondary | ICD-10-CM | POA: Diagnosis not present

## 2019-07-08 DIAGNOSIS — O34219 Maternal care for unspecified type scar from previous cesarean delivery: Secondary | ICD-10-CM | POA: Diagnosis not present

## 2019-07-08 DIAGNOSIS — O34218 Maternal care for other type scar from previous cesarean delivery: Secondary | ICD-10-CM | POA: Diagnosis not present

## 2019-07-08 DIAGNOSIS — Z20822 Contact with and (suspected) exposure to covid-19: Secondary | ICD-10-CM | POA: Diagnosis not present

## 2019-07-08 DIAGNOSIS — G8918 Other acute postprocedural pain: Secondary | ICD-10-CM | POA: Diagnosis not present

## 2019-07-08 DIAGNOSIS — R1084 Generalized abdominal pain: Secondary | ICD-10-CM | POA: Diagnosis not present

## 2019-07-08 DIAGNOSIS — Z886 Allergy status to analgesic agent status: Secondary | ICD-10-CM | POA: Diagnosis not present

## 2019-07-08 DIAGNOSIS — O9081 Anemia of the puerperium: Secondary | ICD-10-CM | POA: Diagnosis not present

## 2019-07-08 DIAGNOSIS — D62 Acute posthemorrhagic anemia: Secondary | ICD-10-CM | POA: Diagnosis not present

## 2019-07-08 DIAGNOSIS — Z23 Encounter for immunization: Secondary | ICD-10-CM | POA: Diagnosis not present

## 2019-07-08 LAB — TYPE AND SCREEN
ABO/RH(D): O NEG
Antibody Screen: NEGATIVE
Extend sample reason: UNDETERMINED

## 2019-07-08 LAB — SARS CORONAVIRUS 2 (TAT 6-24 HRS): SARS Coronavirus 2: NEGATIVE

## 2019-07-08 LAB — CBC
HCT: 31.9 % — ABNORMAL LOW (ref 36.0–46.0)
Hemoglobin: 9.3 g/dL — ABNORMAL LOW (ref 12.0–15.0)
MCH: 21.8 pg — ABNORMAL LOW (ref 26.0–34.0)
MCHC: 29.2 g/dL — ABNORMAL LOW (ref 30.0–36.0)
MCV: 74.9 fL — ABNORMAL LOW (ref 80.0–100.0)
Platelets: 279 10*3/uL (ref 150–400)
RBC: 4.26 MIL/uL (ref 3.87–5.11)
RDW: 16.5 % — ABNORMAL HIGH (ref 11.5–15.5)
WBC: 9.5 10*3/uL (ref 4.0–10.5)
nRBC: 0 % (ref 0.0–0.2)

## 2019-07-08 MED ORDER — SODIUM CHLORIDE 0.9 % IV SOLN
2.0000 g | INTRAVENOUS | Status: AC
  Administered 2019-07-09: 2 g via INTRAVENOUS
  Filled 2019-07-08: qty 2

## 2019-07-09 ENCOUNTER — Inpatient Hospital Stay
Admission: RE | Admit: 2019-07-09 | Discharge: 2019-07-11 | DRG: 787 | Disposition: A | Discharge status: Home / Self Care | Payer: BC Managed Care – PPO | Attending: Obstetrics & Gynecology | Admitting: Obstetrics & Gynecology

## 2019-07-09 ENCOUNTER — Inpatient Hospital Stay: Payer: BC Managed Care – PPO | Admitting: Registered Nurse

## 2019-07-09 ENCOUNTER — Encounter: Payer: Self-pay | Admitting: Obstetrics & Gynecology

## 2019-07-09 ENCOUNTER — Encounter
Admission: RE | Disposition: A | Discharge status: Home / Self Care | Payer: Self-pay | Source: Home / Self Care | Attending: Obstetrics & Gynecology

## 2019-07-09 DIAGNOSIS — Z3480 Encounter for supervision of other normal pregnancy, unspecified trimester: Secondary | ICD-10-CM

## 2019-07-09 DIAGNOSIS — Z6833 Body mass index (BMI) 33.0-33.9, adult: Secondary | ICD-10-CM

## 2019-07-09 DIAGNOSIS — D62 Acute posthemorrhagic anemia: Secondary | ICD-10-CM | POA: Diagnosis not present

## 2019-07-09 DIAGNOSIS — Z98891 History of uterine scar from previous surgery: Secondary | ICD-10-CM

## 2019-07-09 DIAGNOSIS — O34211 Maternal care for low transverse scar from previous cesarean delivery: Secondary | ICD-10-CM | POA: Diagnosis present

## 2019-07-09 DIAGNOSIS — O134 Gestational [pregnancy-induced] hypertension without significant proteinuria, complicating childbirth: Secondary | ICD-10-CM | POA: Diagnosis present

## 2019-07-09 DIAGNOSIS — O34218 Maternal care for other type scar from previous cesarean delivery: Secondary | ICD-10-CM | POA: Diagnosis not present

## 2019-07-09 DIAGNOSIS — O99824 Streptococcus B carrier state complicating childbirth: Secondary | ICD-10-CM | POA: Diagnosis present

## 2019-07-09 DIAGNOSIS — Z3A40 40 weeks gestation of pregnancy: Secondary | ICD-10-CM

## 2019-07-09 DIAGNOSIS — Z886 Allergy status to analgesic agent status: Secondary | ICD-10-CM | POA: Diagnosis not present

## 2019-07-09 DIAGNOSIS — Z23 Encounter for immunization: Secondary | ICD-10-CM | POA: Diagnosis not present

## 2019-07-09 DIAGNOSIS — Z6791 Unspecified blood type, Rh negative: Secondary | ICD-10-CM

## 2019-07-09 DIAGNOSIS — Z20822 Contact with and (suspected) exposure to covid-19: Secondary | ICD-10-CM | POA: Diagnosis present

## 2019-07-09 DIAGNOSIS — O34219 Maternal care for unspecified type scar from previous cesarean delivery: Secondary | ICD-10-CM | POA: Diagnosis not present

## 2019-07-09 DIAGNOSIS — Z3A41 41 weeks gestation of pregnancy: Secondary | ICD-10-CM | POA: Diagnosis not present

## 2019-07-09 DIAGNOSIS — O26899 Other specified pregnancy related conditions, unspecified trimester: Secondary | ICD-10-CM

## 2019-07-09 DIAGNOSIS — O9081 Anemia of the puerperium: Secondary | ICD-10-CM | POA: Diagnosis not present

## 2019-07-09 DIAGNOSIS — O480 Post-term pregnancy: Secondary | ICD-10-CM | POA: Diagnosis not present

## 2019-07-09 HISTORY — DX: History of uterine scar from previous surgery: Z98.891

## 2019-07-09 LAB — SAMPLE TO BLOOD BANK

## 2019-07-09 SURGERY — Surgical Case
Anesthesia: Spinal

## 2019-07-09 MED ORDER — OXYCODONE HCL 5 MG PO TABS: 5.0000 mg | ORAL_TABLET | ORAL | Status: DC | PRN

## 2019-07-09 MED ORDER — MORPHINE SULFATE (PF) 0.5 MG/ML IJ SOLN
INTRAMUSCULAR | Status: AC
  Filled 2019-07-09: qty 10

## 2019-07-09 MED ORDER — SODIUM CHLORIDE 0.9 % IV SOLN
INTRAVENOUS | Status: DC | PRN
  Administered 2019-07-09: 50 ug/min via INTRAVENOUS

## 2019-07-09 MED ORDER — FENTANYL CITRATE (PF) 100 MCG/2ML IJ SOLN
INTRAMUSCULAR | Status: DC | PRN
  Administered 2019-07-09: 15 ug via INTRATHECAL

## 2019-07-09 MED ORDER — OXYTOCIN 40 UNITS IN NORMAL SALINE INFUSION - SIMPLE MED
INTRAVENOUS | Status: DC | PRN
  Administered 2019-07-09: 40 [IU] via INTRAVENOUS

## 2019-07-09 MED ORDER — SIMETHICONE 80 MG PO CHEW: 80.0000 mg | CHEWABLE_TABLET | ORAL | Status: DC | PRN

## 2019-07-09 MED ORDER — ONDANSETRON HCL 4 MG/2ML IJ SOLN
4.0000 mg | Freq: Three times a day (TID) | INTRAMUSCULAR | Status: DC | PRN
  Administered 2019-07-09: 4 mg via INTRAVENOUS
  Filled 2019-07-09: qty 2

## 2019-07-09 MED ORDER — DEXAMETHASONE SODIUM PHOSPHATE 10 MG/ML IJ SOLN
INTRAMUSCULAR | Status: DC | PRN
  Administered 2019-07-09: 10 mg via INTRAVENOUS

## 2019-07-09 MED ORDER — SIMETHICONE 80 MG PO CHEW
80.0000 mg | CHEWABLE_TABLET | ORAL | Status: DC
  Administered 2019-07-10 – 2019-07-11 (×2): 80 mg via ORAL
  Filled 2019-07-09 (×2): qty 1

## 2019-07-09 MED ORDER — SODIUM CHLORIDE 0.9% FLUSH: 3.0000 mL | INTRAVENOUS | Status: DC | PRN

## 2019-07-09 MED ORDER — LACTATED RINGERS IV SOLN: INTRAVENOUS | Status: DC

## 2019-07-09 MED ORDER — ONDANSETRON HCL 4 MG/2ML IJ SOLN
INTRAMUSCULAR | Status: AC
  Filled 2019-07-09: qty 2

## 2019-07-09 MED ORDER — MEPERIDINE HCL 25 MG/ML IJ SOLN: 6.2500 mg | INTRAMUSCULAR | Status: DC | PRN

## 2019-07-09 MED ORDER — PHENYLEPHRINE HCL (PRESSORS) 10 MG/ML IV SOLN
INTRAVENOUS | Status: AC
  Filled 2019-07-09: qty 1

## 2019-07-09 MED ORDER — BUPIVACAINE ON-Q PAIN PUMP (FOR ORDER SET NO CHG)
INJECTION | Status: DC
  Filled 2019-07-09: qty 1

## 2019-07-09 MED ORDER — OXYTOCIN 40 UNITS IN NORMAL SALINE INFUSION - SIMPLE MED
2.5000 [IU]/h | INTRAVENOUS | Status: AC
  Filled 2019-07-09: qty 1000

## 2019-07-09 MED ORDER — MORPHINE SULFATE (PF) 2 MG/ML IV SOLN: 1.0000 mg | INTRAVENOUS | Status: DC | PRN

## 2019-07-09 MED ORDER — BUPIVACAINE HCL (PF) 0.5 % IJ SOLN
INTRAMUSCULAR | Status: DC | PRN
  Administered 2019-07-09: 30 mL

## 2019-07-09 MED ORDER — ACETAMINOPHEN 500 MG PO TABS
1000.0000 mg | ORAL_TABLET | Freq: Four times a day (QID) | ORAL | Status: AC
  Administered 2019-07-09 – 2019-07-10 (×4): 1000 mg via ORAL
  Filled 2019-07-09 (×3): qty 2

## 2019-07-09 MED ORDER — SIMETHICONE 80 MG PO CHEW
80.0000 mg | CHEWABLE_TABLET | Freq: Three times a day (TID) | ORAL | Status: DC
  Administered 2019-07-09 – 2019-07-11 (×6): 80 mg via ORAL
  Filled 2019-07-09 (×7): qty 1

## 2019-07-09 MED ORDER — BUPIVACAINE HCL 0.5 % IJ SOLN
10.0000 mL | Freq: Once | INTRAMUSCULAR | Status: DC
  Filled 2019-07-09: qty 10

## 2019-07-09 MED ORDER — BUPIVACAINE 0.25 % ON-Q PUMP DUAL CATH 400 ML
400.0000 mL | INJECTION | Status: DC
  Filled 2019-07-09: qty 400

## 2019-07-09 MED ORDER — NALBUPHINE HCL 10 MG/ML IJ SOLN: 5.0000 mg | Freq: Once | INTRAMUSCULAR | Status: DC | PRN

## 2019-07-09 MED ORDER — PRENATAL MULTIVITAMIN CH
1.0000 | ORAL_TABLET | Freq: Every day | ORAL | Status: DC
  Filled 2019-07-09 (×3): qty 1

## 2019-07-09 MED ORDER — COCONUT OIL OIL
1.0000 "application " | TOPICAL_OIL | Status: DC | PRN
  Administered 2019-07-10: 1 via TOPICAL
  Filled 2019-07-09: qty 120

## 2019-07-09 MED ORDER — OXYCODONE HCL 5 MG PO TABS
5.0000 mg | ORAL_TABLET | ORAL | Status: DC | PRN
  Administered 2019-07-10 – 2019-07-11 (×4): 5 mg via ORAL
  Filled 2019-07-09 (×4): qty 1

## 2019-07-09 MED ORDER — ONDANSETRON HCL 4 MG/2ML IJ SOLN
4.0000 mg | Freq: Once | INTRAMUSCULAR | Status: AC
  Administered 2019-07-09: 06:00:00 4 mg via INTRAVENOUS

## 2019-07-09 MED ORDER — FENTANYL CITRATE (PF) 100 MCG/2ML IJ SOLN
INTRAMUSCULAR | Status: AC
  Filled 2019-07-09: qty 2

## 2019-07-09 MED ORDER — OXYCODONE HCL 5 MG PO TABS: 10.0000 mg | ORAL_TABLET | ORAL | Status: DC | PRN

## 2019-07-09 MED ORDER — ZOLPIDEM TARTRATE 5 MG PO TABS: 5.0000 mg | ORAL_TABLET | Freq: Every evening | ORAL | Status: DC | PRN

## 2019-07-09 MED ORDER — EPHEDRINE SULFATE 50 MG/ML IJ SOLN
INTRAMUSCULAR | Status: AC
  Filled 2019-07-09: qty 1

## 2019-07-09 MED ORDER — ONDANSETRON HCL 4 MG/2ML IJ SOLN
INTRAMUSCULAR | Status: DC | PRN
  Administered 2019-07-09: 4 mg via INTRAVENOUS

## 2019-07-09 MED ORDER — SCOPOLAMINE 1 MG/3DAYS TD PT72
1.0000 | MEDICATED_PATCH | Freq: Once | TRANSDERMAL | Status: DC
  Administered 2019-07-09: 1.5 mg via TRANSDERMAL
  Filled 2019-07-09: qty 1

## 2019-07-09 MED ORDER — BUPIVACAINE IN DEXTROSE 0.75-8.25 % IT SOLN
INTRATHECAL | Status: DC | PRN
  Administered 2019-07-09: 1.6 mL via INTRATHECAL

## 2019-07-09 MED ORDER — SENNOSIDES-DOCUSATE SODIUM 8.6-50 MG PO TABS
2.0000 | ORAL_TABLET | ORAL | Status: DC
  Administered 2019-07-10 – 2019-07-11 (×2): 2 via ORAL
  Filled 2019-07-09 (×2): qty 2

## 2019-07-09 MED ORDER — LIDOCAINE HCL (PF) 2 % IJ SOLN
INTRAMUSCULAR | Status: AC
  Filled 2019-07-09: qty 10

## 2019-07-09 MED ORDER — DIBUCAINE (PERIANAL) 1 % EX OINT: 1.0000 "application " | TOPICAL_OINTMENT | CUTANEOUS | Status: DC | PRN

## 2019-07-09 MED ORDER — METOCLOPRAMIDE HCL 5 MG/ML IJ SOLN
10.0000 mg | Freq: Once | INTRAMUSCULAR | Status: AC
  Administered 2019-07-09: 10 mg via INTRAVENOUS
  Filled 2019-07-09: qty 2

## 2019-07-09 MED ORDER — OXYTOCIN 40 UNITS IN NORMAL SALINE INFUSION - SIMPLE MED
INTRAVENOUS | Status: AC
  Filled 2019-07-09: qty 1000

## 2019-07-09 MED ORDER — NALBUPHINE HCL 10 MG/ML IJ SOLN: 5.0000 mg | INTRAMUSCULAR | Status: DC | PRN

## 2019-07-09 MED ORDER — LIDOCAINE HCL (PF) 1 % IJ SOLN
INTRAMUSCULAR | Status: DC | PRN
  Administered 2019-07-09: 3 mL via SUBCUTANEOUS

## 2019-07-09 MED ORDER — DEXAMETHASONE SODIUM PHOSPHATE 10 MG/ML IJ SOLN
INTRAMUSCULAR | Status: AC
  Filled 2019-07-09: qty 1

## 2019-07-09 MED ORDER — OXYCODONE-ACETAMINOPHEN 5-325 MG PO TABS: 1.0000 | ORAL_TABLET | ORAL | Status: DC | PRN

## 2019-07-09 MED ORDER — DIPHENHYDRAMINE HCL 25 MG PO CAPS: 25.0000 mg | ORAL_CAPSULE | Freq: Four times a day (QID) | ORAL | Status: DC | PRN

## 2019-07-09 MED ORDER — MORPHINE SULFATE (PF) 0.5 MG/ML IJ SOLN
INTRAMUSCULAR | Status: DC | PRN
  Administered 2019-07-09: .1 mg via INTRATHECAL

## 2019-07-09 MED ORDER — NALOXONE HCL 4 MG/10ML IJ SOLN
1.0000 ug/kg/h | INTRAVENOUS | Status: DC | PRN
  Filled 2019-07-09: qty 5

## 2019-07-09 MED ORDER — SOD CITRATE-CITRIC ACID 500-334 MG/5ML PO SOLN
30.0000 mL | ORAL | Status: AC
  Administered 2019-07-09: 30 mL via ORAL
  Filled 2019-07-09: qty 30

## 2019-07-09 MED ORDER — MENTHOL 3 MG MT LOZG
1.0000 | LOZENGE | OROMUCOSAL | Status: DC | PRN
  Filled 2019-07-09: qty 9

## 2019-07-09 MED ORDER — SUCCINYLCHOLINE CHLORIDE 20 MG/ML IJ SOLN
INTRAMUSCULAR | Status: AC
  Filled 2019-07-09: qty 1

## 2019-07-09 MED ORDER — ACETAMINOPHEN 500 MG PO TABS
1000.0000 mg | ORAL_TABLET | Freq: Four times a day (QID) | ORAL | Status: DC
  Administered 2019-07-10 – 2019-07-11 (×3): 1000 mg via ORAL
  Filled 2019-07-09 (×5): qty 2

## 2019-07-09 MED ORDER — NALOXONE HCL 0.4 MG/ML IJ SOLN: 0.4000 mg | INTRAMUSCULAR | Status: DC | PRN

## 2019-07-09 MED ORDER — WITCH HAZEL-GLYCERIN EX PADS: 1.0000 "application " | MEDICATED_PAD | CUTANEOUS | Status: DC | PRN

## 2019-07-09 MED ORDER — AMMONIA AROMATIC IN INHA
RESPIRATORY_TRACT | Status: AC
  Filled 2019-07-09: qty 10

## 2019-07-09 SURGICAL SUPPLY — 26 items
CANISTER SUCT 3000ML PPV (MISCELLANEOUS) ×2 IMPLANT
CATH KIT ON-Q SILVERSOAK 5IN (CATHETERS) ×4 IMPLANT
CHLORAPREP W/TINT 26 (MISCELLANEOUS) ×4 IMPLANT
COVER WAND RF STERILE (DRAPES) ×2 IMPLANT
DERMABOND ADVANCED (GAUZE/BANDAGES/DRESSINGS) ×1
DERMABOND ADVANCED .7 DNX12 (GAUZE/BANDAGES/DRESSINGS) ×1 IMPLANT
ELECT CAUTERY BLADE 6.4 (BLADE) ×2 IMPLANT
ELECT REM PT RETURN 9FT ADLT (ELECTROSURGICAL) ×2
ELECTRODE REM PT RTRN 9FT ADLT (ELECTROSURGICAL) ×1 IMPLANT
GLOVE SKINSENSE NS SZ8.0 LF (GLOVE) ×1
GLOVE SKINSENSE STRL SZ8.0 LF (GLOVE) ×1 IMPLANT
GOWN STRL REUS W/ TWL LRG LVL3 (GOWN DISPOSABLE) ×1 IMPLANT
GOWN STRL REUS W/ TWL XL LVL3 (GOWN DISPOSABLE) ×2 IMPLANT
GOWN STRL REUS W/TWL LRG LVL3 (GOWN DISPOSABLE) ×1
GOWN STRL REUS W/TWL XL LVL3 (GOWN DISPOSABLE) ×2
HANDLE SUCTION POOLE (INSTRUMENTS) ×1 IMPLANT
NS IRRIG 1000ML POUR BTL (IV SOLUTION) ×2 IMPLANT
PACK C SECTION AR (MISCELLANEOUS) ×2 IMPLANT
PAD OB MATERNITY 4.3X12.25 (PERSONAL CARE ITEMS) ×2 IMPLANT
PAD PREP 24X41 OB/GYN DISP (PERSONAL CARE ITEMS) ×2 IMPLANT
PENCIL SMOKE ULTRAEVAC 22 CON (MISCELLANEOUS) ×2 IMPLANT
SUCTION POOLE HANDLE (INSTRUMENTS) ×2
SUT MAXON ABS #0 GS21 30IN (SUTURE) ×4 IMPLANT
SUT VIC AB 1 CT1 36 (SUTURE) ×8 IMPLANT
SUT VIC AB 2-0 CT1 36 (SUTURE) ×2 IMPLANT
SUT VIC AB 4-0 FS2 27 (SUTURE) ×2 IMPLANT

## 2019-07-09 NOTE — Discharge Summary (Addendum)
OB Discharge Summary     Patient Name: Deborah House DOB: December 13, 1990 MRN: [id number]  Date of admission: 07/09/2019 Delivering MD: Hoyt Koch, MD  Date of Delivery: 07/09/2019  Date of discharge: 07/11/2019  Admitting diagnosis: History of cesarean delivery [Z98.891] Intrauterine pregnancy: [redacted]w[redacted]d     Secondary diagnosis: None     Discharge diagnosis: Term Pregnancy Delivered, Reasons for cesarean section  Elective repeat , [facility] course:  Sceduled C/S   29 y.o. yo G3P1011 at [redacted]w[redacted]d was admitted to the hospital 07/09/2019 for scheduled cesarean section with the following indication:Elective Repeat.  Membrane Rupture Time/Date: 7:55 AM ,07/09/2019   Patient delivered a Viable infant.07/09/2019 "HAVEN" Details of operation can be found in separate operative note.  Pateint had an uncomplicated postpartum course.  She is ambulating, tolerating a regular diet, passing flatus, and urinating well. Patient is discharged home in stable condition on  07/11/19                                                                        Post partum procedures:none  Complications: None  Physical exam on 07/11/2019: Vitals:   07/10/19 1513 07/11/19 0009 07/11/19 0732 07/11/19 0831  BP: 129/73 119/88 (!) 140/97 (!) 143/77  Pulse: 83 71 79 65  Resp: 18 18    Temp: 98.5 F (36.9 C) 98.8 F (37.1 C) 98.3 F (36.8 C)   TempSrc: Oral Oral Oral   SpO2: 99% 98% 100%   Weight:      Height:       General: alert, cooperative and no distress Lochia: appropriate Uterine Fundus: firm Incision: Healing well with no significant drainage DVT Evaluation: No evidence of DVT seen on physical exam.  Labs: Lab Results  Component Value Date   WBC 19.1 (H) 07/10/2019   HGB 7.8 (L) 07/10/2019   HCT 26.1 (L) 07/10/2019   MCV 73.9 (L) 07/10/2019   PLT 282 07/10/2019   CMP Latest Ref Rng & Units 12/17/2016  Glucose 65 - 99 mg/dL 102(H)  BUN 6 - 20 mg/dL 7  Creatinine  0.44 - 1.00 mg/dL 0.61  Sodium 135 - 145 mmol/L 137  Potassium 3.5 - 5.1 mmol/L 3.7  Chloride 101 - 111 mmol/L 107  CO2 22 - 32 mmol/L 22  Calcium 8.9 - 10.3 mg/dL 9.1  Total Protein 6.5 - 8.1 g/dL 7.3  Total Bilirubin 0.3 - 1.2 mg/dL 0.4  Alkaline Phos 38 - 126 U/L 54  AST 15 - 41 U/L 22  ALT 14 - 54 U/L 11(L)    Discharge instruction: per After Visit Summary.  Medications:  Allergies as of 07/11/2019      Reactions   Ibuprofen Swelling    Swelling of face Suspected--unsure if it is a TRUE allergy.      Medication List    STOP taking these medications   acetaminophen 500 MG tablet Commonly known as: TYLENOL     TAKE these medications   norethindrone 0.35 MG tablet Commonly known as: MICRONOR Take 1 tablet (0.35 mg total) by mouth daily.   oxyCODONE-acetaminophen 5-325 MG tablet Commonly known as: Percocet  Take 1 tablet by mouth every 4 (four) hours as needed for severe pain.   Prenatal Gummies/DHA & FA 0.4-32.5 MG Chew Chew 1 tablet by mouth daily.            Discharge Care Instructions  (From admission, onward)         Start     Ordered   07/11/19 0000  Discharge wound care:    Comments: You may apply a light dressing for minor discharge from the incision or to keep waistbands of clothing from rubbing.  You may also have been discharge with a clear dressing in which case this will be removed at your postoperative clinic visit.  You may shower, use soap on your incision.  Avoid baths or soaking the incision in the first 6 weeks following your surgery.Marland Kitchen   07/11/19 0957          Diet: routine diet  Activity: Advance as tolerated. Pelvic rest for 6 weeks.   Outpatient follow up: Follow-up Information    Nadara Mustard, MD. Go in 1 week(s).   Specialty: Obstetrics and Gynecology Contact information: [address] [place] [address] [phone]             Postpartum contraception: Progesterone only pills Rhogam Given postpartum:  yes Rubella vaccine given postpartum: no Varicella vaccine given postpartum: no TDaP given antepartum or postpartum: Yes  Newborn Data: Live born female  Birth Weight: 8 lb 13.8 oz (4020 g) APGAR: 9, 9  Newborn Delivery   Birth date/time: 07/09/2019 07:55:00 Delivery type: C-Section, Low Transverse Trial of labor: No C-section categorization: Repeat       Baby Feeding: Breast  Disposition:home with mother  SIGNED: [name], MD 07/11/2019 9:58 AM

## 2019-07-09 NOTE — Lactation Note (Signed)
This note was copied from a baby's chart. Lactation Consultation Note  Patient Name: Deborah House HTDSK'A Date: 07/09/2019    [facility] was in to see mom and baby shortly after being moved to [facility]. Baby was asleep skin to skin on mom's chest; mom was filling out birth certificate. Mom reported that baby's first feeding after delivery went well. No pain or discomfort was reported, and mom had no questions or concerns at this time. LC briefly reviewed newborn stomach size, feeding behaviors, early hunger cues, and output expectations in the first 24 hours. Encouraged mom to seek assistance with breastfeeding as needed. LC name/number was written on whiteboard.   Maternal Data    Feeding Feeding Type: Breast Fed  LATCH Score Latch: Grasps breast easily, tongue down, lips flanged, rhythmical sucking.  Audible Swallowing: Spontaneous and intermittent  Type of Nipple: Everted at rest and after stimulation  Comfort (Breast/Nipple): Soft / non-tender  Hold (Positioning): Assistance needed to correctly position infant at breast and maintain latch.  LATCH Score: 9  Interventions Interventions: Breast feeding basics reviewed;Assisted with latch;Skin to skin;Hand express;Adjust position;Support pillows;Position options  Lactation Tools Discussed/Used     Consult Status      Deborah House 07/09/2019, 2:58 PM

## 2019-07-09 NOTE — Anesthesia Preprocedure Evaluation (Signed)
Anesthesia Evaluation  Patient identified by MRN, date of birth, ID band Patient awake    Reviewed: Allergy & Precautions, NPO status , Patient's Chart, lab work & pertinent test results  History of Anesthesia Complications Negative for: history of anesthetic complications  Airway Mallampati: III  TM Distance: >3 FB Neck ROM: Full    Dental no notable dental hx. (+) Teeth Intact   Pulmonary neg pulmonary ROS, neg sleep apnea, neg COPD, Patient abstained from smoking.Not current smoker,    Pulmonary exam normal breath sounds clear to auscultation       Cardiovascular Exercise Tolerance: Good METS(-) hypertension(-) CAD and (-) Past MI negative cardio ROS  (-) dysrhythmias  Rhythm:Regular Rate:Normal - Systolic murmurs    Neuro/Psych negative neurological ROS  negative psych ROS   GI/Hepatic neg GERD  ,(+)     (-) substance abuse  , N/V of pregnancy   Endo/Other  neg diabetes  Renal/GU negative Renal ROS     Musculoskeletal   Abdominal (+) + obese,   Peds  Hematology  (+) anemia ,   Anesthesia Other Findings Past Medical History: No date: Abdominal pain No date: Amenorrhea No date: Anemia 2012: Chlamydia     Comment:  tx'd 07/2015: Family history of ovarian cancer     Comment:  genetic testing letter sent No date: Hemorrhoids No date: Low-lying placenta in third trimester 06/01/2014: Missed abortion     Comment:  RPH  Reproductive/Obstetrics                             Anesthesia Physical Anesthesia Plan  ASA: II  Anesthesia Plan: Spinal   Post-op Pain Management:    Induction:   PONV Risk Score and Plan: 4 or greater and Ondansetron and Dexamethasone  Airway Management Planned: Natural Airway  Additional Equipment:   Intra-op Plan:   Post-operative Plan:   Informed Consent: I have reviewed the patients History and Physical, chart, labs and discussed the  procedure including the risks, benefits and alternatives for the proposed anesthesia with the patient or authorized representative who has indicated his/her understanding and acceptance.       Plan Discussed with: CRNA and Surgeon  Anesthesia Plan Comments: (Discussed R/B/A of neuraxial anesthesia technique with patient: - rare risks of spinal/epidural hematoma, nerve damage, infection - Risk of PDPH - Risk of itching - Risk of nausea and vomiting - Risk of conversion to general anesthesia and its associated risks, including sore throat, damage to lips/teeth/oropharynx, and rare risks such as cardiac and respiratory events. - Risk of surgical bleeding requiring blood products Patient voiced understanding.  Patient for secondary C section. Labs OK, no blood dyscrasias.)        Anesthesia Quick Evaluation

## 2019-07-09 NOTE — Interval H&P Note (Signed)
History and Physical Interval Note:  07/09/2019 6:38 AM  Deborah House  has presented today for surgery, with the diagnosis of History of cesarean delivery, desires repeat.  The various methods of treatment have been discussed with the patient and family. After consideration of risks, benefits and other options for treatment, the patient has consented to  Procedure(s): CESAREAN SECTION (N/A) as a surgical intervention.  The patient's history has been reviewed, patient examined, no change in status, stable for surgery.  I have reviewed the patient's chart and labs.  Questions were answered to the patient's satisfaction.     [name]

## 2019-07-09 NOTE — Discharge Instructions (Signed)
Cesarean Delivery, Care After This sheet gives you information about how to care for yourself after your procedure. Your health care provider may also give you more specific instructions. If you have problems or questions, contact your health care provider. What can I expect after the procedure? After the procedure, it is common to have:  A small amount of blood or clear fluid coming from the incision.  Some redness, swelling, and pain in your incision area.  Some abdominal pain and soreness.  Vaginal bleeding (lochia). Even though you did not have a vaginal delivery, you will still have vaginal bleeding and discharge.  Pelvic cramps.  Fatigue. You may have pain, swelling, and discomfort in the tissue between your vagina and your anus (perineum) if:  Your C-section was unplanned, and you were allowed to labor and push.  An incision was made in the area (episiotomy) or the tissue tore during attempted vaginal delivery. Follow these instructions at home: Incision care   Follow instructions from your health care provider about how to take care of your incision. Make sure you: ? Wash your hands with soap and water before you change your bandage (dressing). If soap and water are not available, use hand sanitizer. ? If you have a dressing, change it or remove it as told by your health care provider. ? Leave stitches (sutures), skin staples, skin glue, or adhesive strips in place. These skin closures may need to stay in place for 2 weeks or longer. If adhesive strip edges start to loosen and curl up, you may trim the loose edges. Do not remove adhesive strips completely unless your health care provider tells you to do that.  Check your incision area every day for signs of infection. Check for: ? More redness, swelling, or pain. ? More fluid or blood. ? Warmth. ? Pus or a bad smell.  Do not take baths, swim, or use a hot tub until your health care provider says it's okay. Ask your health  care provider if you can take showers.  When you cough or sneeze, hug a pillow. This helps with pain and decreases the chance of your incision opening up (dehiscing). Do this until your incision heals. Medicines  Take over-the-counter and prescription medicines only as told by your health care provider.  If you were prescribed an antibiotic medicine, take it as told by your health care provider. Do not stop taking the antibiotic even if you start to feel better.  Do not drive or use heavy machinery while taking prescription pain medicine. Lifestyle  Do not drink alcohol. This is especially important if you are breastfeeding or taking pain medicine.  Do not use any products that contain nicotine or tobacco, such as cigarettes, e-cigarettes, and chewing tobacco. If you need help quitting, ask your health care provider. Eating and drinking  Drink at least 8 eight-ounce glasses of water every day unless told not to by your health care provider. If you breastfeed, you may need to drink even more water.  Eat high-fiber foods every day. These foods may help prevent or relieve constipation. High-fiber foods include: ? Whole grain cereals and breads. ? Brown rice. ? Beans. ? Fresh fruits and vegetables. Activity   If possible, have someone help you care for your baby and help with household activities for at least a few days after you leave the hospital.  Return to your normal activities as told by your health care provider. Ask your health care provider what activities are safe for   you.  Rest as much as possible. Try to rest or take a nap while your baby is sleeping.  Do not lift anything that is heavier than 10 lbs (4.5 kg), or the limit that you were told, until your health care provider says that it is safe.  Talk with your health care provider about when you can engage in sexual activity. This may depend on your: ? Risk of infection. ? How fast you heal. ? Comfort and desire to  engage in sexual activity. General instructions  Do not use tampons or douches until your health care provider approves.  Wear loose, comfortable clothing and a supportive and well-fitting bra.  Keep your perineum clean and dry. Wipe from front to back when you use the toilet.  If you pass a blood clot, save it and call your health care provider to discuss. Do not flush blood clots down the toilet before you get instructions from your health care provider.  Keep all follow-up visits for you and your baby as told by your health care provider. This is important. Contact a health care provider if:  You have: ? A fever. ? Bad-smelling vaginal discharge. ? Pus or a bad smell coming from your incision. ? Difficulty or pain when urinating. ? A sudden increase or decrease in the frequency of your bowel movements. ? More redness, swelling, or pain around your incision. ? More fluid or blood coming from your incision. ? A rash. ? Nausea. ? Little or no interest in activities you used to enjoy. ? Questions about caring for yourself or your baby.  Your incision feels warm to the touch.  Your breasts turn red or become painful or hard.  You feel unusually sad or worried.  You vomit.  You pass a blood clot from your vagina.  You urinate more than usual.  You are dizzy or light-headed. Get help right away if:  You have: ? Pain that does not go away or get better with medicine. ? Chest pain. ? Difficulty breathing. ? Blurred vision or spots in your vision. ? Thoughts about hurting yourself or your baby. ? New pain in your abdomen or in one of your legs. ? A severe headache.  You faint.  You bleed from your vagina so much that you fill more than one sanitary pad in one hour. Bleeding should not be heavier than your heaviest period. Summary  After the procedure, it is common to have pain at your incision site, abdominal cramping, and slight bleeding from your vagina.  Check  your incision area every day for signs of infection.  Tell your health care provider about any unusual symptoms.  Keep all follow-up visits for you and your baby as told by your health care provider. This information is not intended to replace advice given to you by your health care provider. Make sure you discuss any questions you have with your health care provider. Document Revised: 11/13/2017 Document Reviewed: 11/13/2017 Elsevier Patient Education  2020 [employer]  

## 2019-07-09 NOTE — Op Note (Signed)
Cesarean Section Procedure Note Indications: prior cesarean section and term intrauterine pregnancy  Pre-operative Diagnosis: Intrauterine pregnancy [redacted]w[redacted]d ;  prior cesarean section and term intrauterine pregnancy Post-operative Diagnosis: same, delivered. Procedure: Low Transverse Cesarean Section Surgeon: Annamarie Major, MD, FACOG Assistant(s): Dr Jerene Pitch, No other capable assistant available, in surgery requiring high level assistant. Anesthesia: Spinal anesthesia Estimated Blood Loss:700 mL Complications: None; patient tolerated the procedure well. Disposition: PACU - hemodynamically stable. Condition: stable  Findings: A female infant in the cephalic presentation.  "Haven" Amniotic fluid - Meconium  Birth weight 8-14 lbs.  Apgars of 9 and 9.  Intact placenta with a three-vessel cord. Grossly normal uterus, tubes and ovaries bilaterally. No significant intraabdominal adhesions were noted.  Procedure Details   The patient was taken to Operating Room, identified as the correct patient and the procedure verified as C-Section Delivery. A Time Out was held and the above information confirmed. After induction of anesthesia, the patient was draped and prepped in the usual sterile manner. A Pfannenstiel incision was made and carried down through the subcutaneous tissue to the fascia. Fascial incision was made and extended transversely with the Mayo scissors. The fascia was separated from the underlying rectus tissue superiorly and inferiorly. The peritoneum was identified and entered bluntly. Peritoneal incision was extended longitudinally. The utero-vesical peritoneal reflection was incised transversely and a bladder flap was created digitally.  A low transverse hysterotomy was made. The fetus was delivered atraumatically. The umbilical cord was clamped x2 and cut and the infant was handed to the awaiting pediatricians. The placenta was removed intact and appeared normal with a 3-vessel cord.  The  uterus was exteriorized and cleared of all clot and debris. The hysterotomy was closed with running sutures of 0 Vicryl suture. A second imbricating layer was placed with the same suture. Excellent hemostasis was observed. The uterus was returned to the abdomen. The pelvis was irrigated and again, excellent hemostasis was noted.  The On Q Pain pump System was then placed.  Trocars were placed through the abdominal wall into the subfascial space and these were used to thread the silver soaker cathaters into place.The rectus fascia was then reapproximated with running sutures of Maxon, with careful placement not to incorporate the cathaters. Subcutaneous tissues are then irrigated with saline and hemostasis assured.  The surgical assistant performed tissue retraction, assistance with suturing, and fundal pressure.   Skin is then closed with 4-0 vicryl suture in a subcuticular fashion followed by skin adhesive. The cathaters are flushed each with 5 mL of Bupivicaine and stabilized into place with dressing. Instrument, sponge, and needle counts were correct prior to the abdominal closure and at the conclusion of the case.  The patient tolerated the procedure well and was transferred to the recovery room in stable condition.   Annamarie Major, MD, [facility], [facility] 07/09/2019  8:41 AM

## 2019-07-09 NOTE — Transfer of Care (Signed)
Immediate Anesthesia Transfer of Care Note  Patient: Deborah House  Procedure(s) Performed: CESAREAN SECTION (N/A )  Patient Location: PACU  Anesthesia Type:Spinal  Level of Consciousness: awake, alert  and oriented  Airway & Oxygen Therapy: Patient Spontanous Breathing  Post-op Assessment: Report given to RN and Post -op Vital signs reviewed and stable  Post vital signs: Reviewed and stable  Last Vitals:  Vitals Value Taken Time  BP 107/70 07/09/19 0841  Temp    Pulse 69 07/09/19 0841  Resp 15 07/09/19 0841  SpO2 100 % 07/09/19 0841    Last Pain:  Vitals:   07/09/19 0841  TempSrc:   PainSc: 0-No pain         Complications: No apparent anesthesia complications

## 2019-07-09 NOTE — Anesthesia Procedure Notes (Signed)
Spinal  Patient location during procedure: OR Start time: 07/09/2019 7:35 AM End time: 07/09/2019 7:39 AM Staffing Performed: resident/CRNA  Anesthesiologist: Arita Miss, MD Resident/CRNA: Hedda Slade, CRNA Preanesthetic Checklist Completed: patient identified, IV checked, site marked, risks and benefits discussed, surgical consent, monitors and equipment checked, pre-op evaluation and timeout performed Spinal Block Patient position: sitting Prep: ChloraPrep Patient monitoring: heart rate, continuous pulse ox, blood pressure and cardiac monitor Approach: midline Location: L3-4 Injection technique: single-shot Needle Needle type: Whitacre and Introducer  Needle gauge: 24 G Needle length: 9 cm Assessment Sensory level: T4 Additional Notes Negative paresthesia. Negative blood return. Positive free-flowing CSF. Expiration date of kit checked and confirmed. Patient tolerated procedure well, without complications.

## 2019-07-10 DIAGNOSIS — G8918 Other acute postprocedural pain: Secondary | ICD-10-CM | POA: Diagnosis not present

## 2019-07-10 DIAGNOSIS — O34219 Maternal care for unspecified type scar from previous cesarean delivery: Secondary | ICD-10-CM | POA: Diagnosis not present

## 2019-07-10 DIAGNOSIS — R1084 Generalized abdominal pain: Secondary | ICD-10-CM | POA: Diagnosis not present

## 2019-07-10 LAB — CBC
HCT: 26.1 % — ABNORMAL LOW (ref 36.0–46.0)
Hemoglobin: 7.8 g/dL — ABNORMAL LOW (ref 12.0–15.0)
MCH: 22.1 pg — ABNORMAL LOW (ref 26.0–34.0)
MCHC: 29.9 g/dL — ABNORMAL LOW (ref 30.0–36.0)
MCV: 73.9 fL — ABNORMAL LOW (ref 80.0–100.0)
Platelets: 282 10*3/uL (ref 150–400)
RBC: 3.53 MIL/uL — ABNORMAL LOW (ref 3.87–5.11)
RDW: 16.3 % — ABNORMAL HIGH (ref 11.5–15.5)
WBC: 19.1 10*3/uL — ABNORMAL HIGH (ref 4.0–10.5)
nRBC: 0.2 % (ref 0.0–0.2)

## 2019-07-10 LAB — FETAL SCREEN: Fetal Screen: NEGATIVE

## 2019-07-10 MED ORDER — RHO D IMMUNE GLOBULIN 1500 UNIT/2ML IJ SOSY
300.0000 ug | PREFILLED_SYRINGE | Freq: Once | INTRAMUSCULAR | Status: AC
  Administered 2019-07-10: 300 ug via INTRAVENOUS
  Filled 2019-07-10: qty 2

## 2019-07-10 NOTE — Addendum Note (Signed)
Addendum  created 07/10/19 0928 by Omer Jack, CRNA   Clinical Note Signed

## 2019-07-10 NOTE — Anesthesia Postprocedure Evaluation (Signed)
Anesthesia Post Note  Patient: Deborah House  Procedure(s) Performed: CESAREAN SECTION (N/A )  Patient location during evaluation: Mother Baby Anesthesia Type: Spinal Level of consciousness: awake and alert and oriented Pain management: pain level controlled Vital Signs Assessment: post-procedure vital signs reviewed and stable Respiratory status: spontaneous breathing and nonlabored ventilation Cardiovascular status: stable Postop Assessment: no headache, no backache, no apparent nausea or vomiting, patient able to bend at knees, adequate PO intake and able to ambulate Anesthetic complications: no     Last Vitals:  Vitals:   07/10/19 0500 07/10/19 0744  BP:  139/88  Pulse:    Resp:  20  Temp:  36.6 C  SpO2: 96%     Last Pain:  Vitals:   07/10/19 0744  TempSrc: Oral  PainSc:                  Zachary George

## 2019-07-10 NOTE — Anesthesia Postprocedure Evaluation (Cosign Needed)
Anesthesia Post Note  Patient: Deborah House  Procedure(s) Performed: CESAREAN SECTION (N/A )  Anesthesia Type: Spinal     Last Vitals:  Vitals:   07/10/19 0500 07/10/19 0744  BP:  139/88  Pulse:    Resp:  20  Temp:  36.6 C  SpO2: 96%     Last Pain:  Vitals:   07/10/19 0744  TempSrc: Oral  PainSc:                  Zachary George

## 2019-07-10 NOTE — Progress Notes (Addendum)
Obstetric Postpartum/PostOperative Daily Progress Note Subjective:  29 y.o. G3P1011 post-operative day # 1 status post repeat cesarean delivery.  She is ambulating, is tolerating po, is voiding spontaneously.  Her pain is well controlled on PO pain medications and On Q pump. Her lochia is less than menses. She reports breastfeeding is going well with some assistance.   Medications SCHEDULED MEDICATIONS  . acetaminophen  1,000 mg Oral Q6H  . prenatal multivitamin  1 tablet Oral Q1200  . scopolamine  1 patch Transdermal Once  . senna-docusate  2 tablet Oral Q24H  . simethicone  80 mg Oral TID PC  . simethicone  80 mg Oral Q24H    MEDICATION INFUSIONS  . bupivacaine 0.25 % ON-Q pump DUAL CATH 400 mL    . bupivacaine ON-Q pain pump    . lactated ringers    . naLOXone (NARCAN) adult infusion for PRURITIS      PRN MEDICATIONS  coconut oil, witch hazel-glycerin **AND** dibucaine, diphenhydrAMINE, menthol-cetylpyridinium, morphine injection, nalbuphine **OR** nalbuphine, nalbuphine **OR** nalbuphine, naloxone **AND** sodium chloride flush, naLOXone (NARCAN) adult infusion for PRURITIS, ondansetron (ZOFRAN) IV, oxyCODONE, simethicone, zolpidem    Objective:   Vitals:   07/10/19 0100 07/10/19 0300 07/10/19 0500 07/10/19 0744  BP:    139/88  Pulse:    84  Resp:    20  Temp:    97.9 F (36.6 C)  TempSrc:    Oral  SpO2: 98% 97% 96%   Weight:      Height:        Current Vital Signs 24h Vital Sign Ranges  T 97.9 F (36.6 C) Temp  Avg: 97.8 F (36.6 C)  Min: 97.4 F (36.3 C)  Max: 98.2 F (36.8 C)  BP 139/88 BP  Min: 106/58  Max: 139/88  HR 84 Pulse  Avg: 62.7  Min: 52  Max: 84  RR 20 Resp  Avg: 17.3  Min: 14  Max: 20  SaO2 96 % Room Air SpO2  Avg: 97.8 %  Min: 96 %  Max: 99 %       24 Hour I/O Current Shift I/O  Time Ins Outs 02/18 0701 - 02/19 0700 In: 2029.2 [I.V.:1929.2] Out: 2888 [Urine:1685] No intake/output data recorded.  General: NAD Pulmonary: no increased work of  breathing Abdomen: non-distended, non-tender, fundus firm at level of umbilicus Inc: Clean/dry/intact, On Q pump intact Extremities: no edema, no erythema, no tenderness  Labs:  Recent Labs  Lab 07/08/19 0941 07/10/19 0447  WBC 9.5 19.1*  HGB 9.3* 7.8*  HCT 31.9* 26.1*  PLT 279 282     Assessment:   29 y.o. G3P1011 postoperative day # 1 status post repeat cesarean section, lactating  Plan:  1) Acute blood loss anemia - hemodynamically stable and asymptomatic - po ferrous sulfate  2) O NEG Rhogam to be administered today/ Rubella 2.06 (06/17 1536)/ Varicella Immune  3) TDAP status given antepartum  4) breast /Contraception = oral progesterone-only contraceptive  5) Disposition: continue current care   Tresea Mall, CNM 07/10/2019 10:06 AM

## 2019-07-10 NOTE — Lactation Note (Signed)
This note was copied from a baby's chart. Lactation Consultation Note  Patient Name: Deborah House Date: 07/10/2019 Reason for consult: Follow-up assessment;Mother's request   Maternal Data Formula Feeding for Exclusion: No Has patient been taught Hand Expression?: Yes Does the patient have breastfeeding experience prior to this delivery?: Yes  Feeding Feeding Type: Breast Fed Positioned at breast in across mom on pillows so that mom can support breast to get deeper latch, baby sleepy and not aggressive, needs stimulation to suck   LATCH Score Latch: Repeated attempts needed to sustain latch, nipple held in mouth throughout feeding, stimulation needed to elicit sucking reflex.  Audible Swallowing: A few with stimulation  Type of Nipple: Everted at rest and after stimulation  Comfort (Breast/Nipple): Soft / non-tender(large breasts)  Hold (Positioning): Assistance needed to correctly position infant at breast and maintain latch.  LATCH Score: 7  Interventions Interventions: Assisted with latch;Hand express;Breast compression;Adjust position;Support pillows  Lactation Tools Discussed/Used WIC Program: No   Consult Status Consult Status: PRN    Dyann Kief 07/10/2019, 1:05 PM

## 2019-07-11 LAB — RHOGAM INJECTION: Unit division: 0

## 2019-07-11 MED ORDER — NORETHINDRONE 0.35 MG PO TABS: 1.0000 | ORAL_TABLET | Freq: Every day | ORAL | 11 refills | Status: DC

## 2019-07-11 MED ORDER — OXYCODONE-ACETAMINOPHEN 5-325 MG PO TABS: 1.0000 | ORAL_TABLET | ORAL | 0 refills | Status: DC | PRN

## 2019-07-11 NOTE — Progress Notes (Signed)
Discharge instructions given and prescription sent to pharmacy by provider. Patient verbalizes understanding of teaching. Patient discharged home via wheelchair at 1245.

## 2019-07-15 ENCOUNTER — Telehealth: Payer: Self-pay

## 2019-07-15 NOTE — Telephone Encounter (Signed)
FMLA/DISABILITY form for [employer] filled out, signature obtained and given to KT for processing. 

## 2019-07-16 ENCOUNTER — Encounter: Payer: Self-pay | Admitting: Obstetrics & Gynecology

## 2019-07-16 ENCOUNTER — Other Ambulatory Visit: Payer: Self-pay

## 2019-07-16 ENCOUNTER — Ambulatory Visit (INDEPENDENT_AMBULATORY_CARE_PROVIDER_SITE_OTHER): Payer: BC Managed Care – PPO | Admitting: Obstetrics & Gynecology

## 2019-07-16 VITALS — BP 120/60 | HR 114 | Ht 66.0 in | Wt 223.0 lb

## 2019-07-16 DIAGNOSIS — Z98891 History of uterine scar from previous surgery: Secondary | ICD-10-CM

## 2019-07-16 MED ORDER — BUTALBITAL-APAP-CAFFEINE 50-325-40 MG PO TABS: 1.0000 | ORAL_TABLET | ORAL | 2 refills | Status: DC | PRN

## 2019-07-16 NOTE — Progress Notes (Signed)
  Postoperative Follow-up Patient presents post op from recent Cesarean Section performed for Elective repeat, 1 week ago.   Subjective: Patient reports some improvement in her immediate post op symptoms. Eating a regular diet without difficulty. Pain is controlled with current analgesics. Medications being used: acetaminophen.  Activity: normal activities of daily living. Patient reports additional symptom's since surgery of appropriate lochia, no signs of depression, and no signs of mastitis.  Some headaches most days since delivery  Objective: BP 120/60 (BP Location: Right Arm, Patient Position: Sitting, Cuff Size: Normal)   Pulse (!) 114   Ht 5\' 6"  (1.676 m)   Wt 223 lb (101.2 kg)   LMP 07/09/2019   Breastfeeding Yes   BMI 35.99 kg/m  Physical Exam Constitutional:      General: She is not in acute distress.    Appearance: She is well-developed.  Cardiovascular:     Rate and Rhythm: Normal rate.  Pulmonary:     Effort: Pulmonary effort is normal.  Abdominal:     General: There is no distension.     Palpations: Abdomen is soft.     Tenderness: There is no abdominal tenderness.     Comments: Incision Healing Well   Musculoskeletal:        General: Normal range of motion.  Neurological:     Mental Status: She is alert and oriented to person, place, and time.     Cranial Nerves: No cranial nerve deficit.  Skin:    General: Skin is warm and dry.     Assessment: s/p : Cesarean Section for Elective repeat stable  Plan: Patient has done well after her Cesarean Section with no apparent complications.  I have discussed the post-operative course to date, and the expected progress moving forward.  The patient understands what complications to be concerned about.  I will see the patient in routine follow up, or sooner if needed.    Activity plan: No heavy lifting.07/11/2019  Pelvic rest. She desires oral progesterone-only contraceptive for postpartum contraception.  Fioricet for  h/a's  Marland Kitchen 07/16/2019, 5:09 PM

## 2019-07-30 ENCOUNTER — Telehealth: Payer: Self-pay

## 2019-07-30 NOTE — Telephone Encounter (Signed)
Pt called triage saying she had delivered through csection recently, wanted to know if it was ok to get covid19 vaccine. Per Handout on recommendations for pregnant and lactating individuals (and Katelyn), it was her call at the end of the day. Pt says she is not breastfeeding, she just wanted to ask.

## 2019-07-31 ENCOUNTER — Ambulatory Visit: Payer: BC Managed Care – PPO | Attending: Internal Medicine

## 2019-07-31 DIAGNOSIS — Z23 Encounter for immunization: Secondary | ICD-10-CM

## 2019-07-31 NOTE — Progress Notes (Signed)
   Covid-19 Vaccination Clinic  Name:  Deborah House    MRN: [id number] DOB: December 01, 1990  07/31/2019  [name] was observed post Covid-19 immunization for 15 minutes without incident. She was provided with Vaccine Information Sheet and instruction to access the V-Safe system.   [name] was instructed to call 911 with any severe reactions post vaccine: Marland Kitchen Difficulty breathing  . Swelling of face and throat  . A fast heartbeat  . A bad rash all over body  . Dizziness and weakness   Immunizations Administered    Name Date Dose VIS Date Route   Moderna COVID-19 Vaccine 07/31/2019 10:08 AM 0.5 mL 04/21/2019 Intramuscular   Manufacturer: Moderna   Lot: [id number]   NDC: 74944-967-59

## 2019-08-05 NOTE — Telephone Encounter (Signed)
Pt called triage asking if we received fax from insurance asking for some medical information for her husbands FMLA approval.

## 2019-08-06 NOTE — Telephone Encounter (Signed)
Patient aware we did not receive forms and will have them re-fax.

## 2019-08-10 ENCOUNTER — Telehealth: Payer: Self-pay

## 2019-08-10 NOTE — Telephone Encounter (Signed)
Pt calling regarding FMLA questions. CB# [phone]

## 2019-08-11 NOTE — Telephone Encounter (Signed)
Pt called and told I have received her husbands paperwork. Asked pt about what dates they need to have covered for him pt stated 06/18/19 to 07/09/19. Advised pt they should be completely later this afternoon. Pt happy and understanding

## 2019-08-19 ENCOUNTER — Other Ambulatory Visit (HOSPITAL_COMMUNITY)
Admission: RE | Admit: 2019-08-19 | Discharge: 2019-08-19 | Disposition: A | Discharge status: Home / Self Care | Payer: BC Managed Care – PPO | Source: Ambulatory Visit | Attending: Obstetrics & Gynecology | Admitting: Obstetrics & Gynecology

## 2019-08-19 ENCOUNTER — Encounter: Payer: Self-pay | Admitting: Obstetrics & Gynecology

## 2019-08-19 ENCOUNTER — Other Ambulatory Visit: Payer: Self-pay

## 2019-08-19 ENCOUNTER — Ambulatory Visit (INDEPENDENT_AMBULATORY_CARE_PROVIDER_SITE_OTHER): Payer: BC Managed Care – PPO | Admitting: Obstetrics & Gynecology

## 2019-08-19 VITALS — BP 130/80 | Ht 66.0 in | Wt 221.0 lb

## 2019-08-19 DIAGNOSIS — Z124 Encounter for screening for malignant neoplasm of cervix: Secondary | ICD-10-CM | POA: Insufficient documentation

## 2019-08-19 DIAGNOSIS — Z98891 History of uterine scar from previous surgery: Secondary | ICD-10-CM

## 2019-08-19 NOTE — Patient Instructions (Addendum)
Referral List  Psychiatry   Counselors  Conni Elliot, MD  Dawayne Cirri, [facility]  [place] [phone]   [phone]      Harle Battiest     Licensed Professional Counselor, [place]     [phone]      Burman Foster     Clinical Social Work/Therapist, MSW      [phone]           [facility] [place] [phone] ext [phone]  [facility] [facility] will see patients on a walk-in basis and then will schedule them depending on their needs. [phone]  Drs Janeece Riggers and Percell Belt are psychiatrists who take Medicaid. They are located in [place], [phone]  Kathreen Cosier is out of the [facility], is a LCSW who specializes in mental health. She sees children and adults. [phone].

## 2019-08-19 NOTE — Progress Notes (Signed)
  OBSTETRICS POSTPARTUM CLINIC PROGRESS NOTE  Subjective:     Deborah House is a 29 y.o. G3P1011 female who presents for a postpartum visit. She is 6 weeks postpartum following a Term pregnancy and delivery by C-section.  I have fully reviewed the prenatal and intrapartum course. Anesthesia: spinal.  Postpartum course has been complicated by uncomplicated.  Baby is feeding by Breast.  Bleeding: patient has not  resumed menses.  Bowel function is normal. Bladder function is normal.  Patient is not sexually active. Contraception method desired is oral progesterone-only contraceptive.  Postpartum depression screening: negative. Edinburgh 7.  The following portions of the patient's history were reviewed and updated as appropriate: allergies, current medications, past family history, past medical history, past social history, past surgical history and problem list.  Review of Systems Pertinent items are noted in HPI.  Objective:    BP 130/80   Ht 5\' 6"  (1.676 m)   Wt 221 lb (100.2 kg)   BMI 35.67 kg/m   General:  alert and no distress   Breasts:  inspection negative, no nipple discharge or bleeding, no masses or nodularity palpable  Lungs: clear to auscultation bilaterally  Heart:  regular rate and rhythm, S1, S2 normal, no murmur, click, rub or gallop  Abdomen: soft, non-tender; bowel sounds normal; no masses,  no organomegaly.  Well healed Pfannenstiel incision   Vulva:  normal  Vagina: normal vagina, no discharge, exudate, lesion, or erythema  Cervix:  no cervical motion tenderness and no lesions  Corpus: normal size, contour, position, consistency, mobility, non-tender  Adnexa:  normal adnexa and no mass, fullness, tenderness  Rectal Exam: Not performed.          Assessment:  Post Partum Care visit 1. S/P cesarean section  2. Screening for cervical cancer - Cytology - PAP   Plan:  See orders and Patient Instructions Follow up in: 12 months or as needed.  List  of counselors to discuss stress done  , MD, Annamarie Major Ob/Gyn, [facility] 08/19/2019  1:52 PM

## 2019-08-20 LAB — CYTOLOGY - PAP
Adequacy: ABSENT
Comment: NEGATIVE
Diagnosis: NEGATIVE
High risk HPV: NEGATIVE

## 2019-09-01 ENCOUNTER — Ambulatory Visit: Payer: BC Managed Care – PPO | Attending: Internal Medicine

## 2019-09-01 DIAGNOSIS — Z23 Encounter for immunization: Secondary | ICD-10-CM

## 2019-09-01 NOTE — Progress Notes (Signed)
   Covid-19 Vaccination Clinic  Name:  Deborah House    MRN: [id number] DOB: 1990-09-02  09/01/2019  [name] was observed post Covid-19 immunization for 15 minutes without incident. She was provided with Vaccine Information Sheet and instruction to access the V-Safe system.   [name] was instructed to call 911 with any severe reactions post vaccine: Marland Kitchen Difficulty breathing  . Swelling of face and throat  . A fast heartbeat  . A bad rash all over body  . Dizziness and weakness   Immunizations Administered    Name Date Dose VIS Date Route   Moderna COVID-19 Vaccine 09/01/2019  9:26 AM 0.5 mL 04/21/2019 Intramuscular   Manufacturer: Moderna   Lot: [id number]   NDC: 04136-438-37

## 2019-09-23 ENCOUNTER — Other Ambulatory Visit: Payer: Self-pay | Admitting: Physician Assistant

## 2019-09-23 ENCOUNTER — Ambulatory Visit (INDEPENDENT_AMBULATORY_CARE_PROVIDER_SITE_OTHER): Payer: BC Managed Care – PPO | Admitting: Physician Assistant

## 2019-09-23 ENCOUNTER — Encounter: Payer: Self-pay | Admitting: Physician Assistant

## 2019-09-23 ENCOUNTER — Other Ambulatory Visit: Payer: Self-pay

## 2019-09-23 VITALS — BP 118/88 | HR 79 | Temp 96.6°F | Ht 66.0 in | Wt 224.0 lb

## 2019-09-23 DIAGNOSIS — Z6833 Body mass index (BMI) 33.0-33.9, adult: Secondary | ICD-10-CM | POA: Diagnosis not present

## 2019-09-23 DIAGNOSIS — E611 Iron deficiency: Secondary | ICD-10-CM | POA: Diagnosis not present

## 2019-09-23 DIAGNOSIS — R5383 Other fatigue: Secondary | ICD-10-CM

## 2019-09-23 MED ORDER — QSYMIA 3.75-23 MG PO CP24: ORAL_CAPSULE | ORAL | 0 refills | Status: DC

## 2019-09-23 NOTE — Patient Instructions (Signed)

## 2019-09-23 NOTE — Progress Notes (Addendum)
Established patient visit   Patient: Deborah House   DOB: 09-15-1990   29 y.o. Female  MRN: [id number] Visit Date: 09/23/2019  Today's healthcare provider: Trey Sailors, PA-C   Chief Complaint  Patient presents with  . Weight Loss  . Fatigue   Subjective    HPI  Weight Loss Patient presents today for weight loss. Patient goal weight is under 200 and then afterwards 170. Patient reports fatigue and have no energy at times. Patient states she was on phentermine before. She was on 37.5 mg QD of phentermine from 03/2018 to 06/2018. She went from 233 lbs to 220 lbs. She subsequently became pregnant. She now weighs 224 lbs.  Breakfast: 2 toast w/ butter, 2 bacon Lunch: sandwich,fast food, salad Dinner: rice, chicken, spaghetti Snack: pretzels bites,popcorn, cookies, jelly beans, chocolate candy bars Beverages: quit soda 3 weeks ago, juice, water, coffee (cream & sugar), minute maid w/ seltzer water  Fatigue Patient states she has been tired a lot more then usual. Patient states she had a baby 2 months ago and the baby does wake up once or twice. Patient states she falls a sleep while at work. She reports 5 hours asleep. She switch every four weeks from nights to days. Results of the Epworth flowsheet 09/23/2019  Sitting and reading 1  Watching TV 0  Sitting, inactive in a public place (e.g. a theatre or a meeting) 1  As a passenger in a car for an hour without a break 2  Lying down to rest in the afternoon when circumstances permit 3  Sitting and talking to someone 0  Sitting quietly after a lunch without alcohol 1  In a car, while stopped for a few minutes in traffic 0  Total score 8        Medications: Outpatient Medications Prior to Visit  Medication Sig  . acetaminophen (TYLENOL) 500 MG tablet Take 500 mg by mouth every 6 (six) hours as needed for moderate pain or headache.  . norethindrone (MICRONOR) 0.35 MG tablet Take 1 tablet (0.35 mg total) by mouth  daily.  . Prenatal MV-Min-FA-Omega-3 (PRENATAL GUMMIES/DHA & FA) 0.4-32.5 MG CHEW Chew 1 tablet by mouth daily.    No facility-administered medications prior to visit.    Review of Systems  Constitutional: Positive for fatigue.  Respiratory: Negative.   Cardiovascular: Negative.   Neurological: Negative.       Objective    BP 118/88 (BP Location: Left Arm, Patient Position: Sitting, Cuff Size: Normal)   Pulse 79   Temp (!) 96.6 F (35.9 C) (Temporal)   Ht 5\' 6"  (1.676 m)   Wt 224 lb (101.6 kg)   LMP 09/01/2019 (Exact Date) Comment: Spotting started on 09/21/2019  SpO2 96%   Breastfeeding No   BMI 36.15 kg/m    Physical Exam Constitutional:      Appearance: Normal appearance.  Cardiovascular:     Rate and Rhythm: Normal rate and regular rhythm.     Pulses: Normal pulses.     Heart sounds: Normal heart sounds.  Pulmonary:     Effort: Pulmonary effort is normal.     Breath sounds: Normal breath sounds.  Skin:    General: Skin is warm and dry.  Neurological:     General: No focal deficit present.     Mental Status: She is oriented to person, place, and time.  Psychiatric:        Mood and Affect: Mood normal.  Behavior: Behavior normal.       No results found for any visits on 09/23/19.  Assessment & Plan    1. Iron deficiency   - CBC with Differential/Platelet - Iron, TIBC and Ferritin Panel  2. Fatigue, unspecified type  Suspect due to newborn and shift schedule. May need sleep study in the future.  - CBC with Differential/Platelet - Iron, TIBC and Ferritin Panel - Comprehensive metabolic panel - TSH  3. BMI 33.0-33.9,adult Patient would like to lose 30-40 pounds and was prescribed Qsymia as below. Qsymia would be preferred over other weight loss medication due to appetite suppression properties as well as stimulant properties. She will be able to take this daily over the long term rather than through intervals during which she will gain weight.    Other weight loss medication as topiramate, contrave and saxenda was advised. - Phentermine-Topiramate (QSYMIA) 3.75-23 MG CP24; Take 1 pill daily for two weeks, then 2 pills once daily onward  Dispense: 30 capsule; Refill: 0   Return in about 2 months (around 11/23/2019) for Weight Loss.      ITrinna Post, PA-C, have reviewed all documentation for this visit. The documentation on 09/23/19 for the exam, diagnosis, procedures, and orders are all accurate and complete.    Paulene Floor  [facility] [phone] (phone) [phone] (fax)  [place]

## 2019-09-24 ENCOUNTER — Telehealth: Payer: Self-pay | Admitting: Physician Assistant

## 2019-09-24 DIAGNOSIS — D509 Iron deficiency anemia, unspecified: Secondary | ICD-10-CM

## 2019-09-24 LAB — CBC WITH DIFFERENTIAL/PLATELET
Basophils Absolute: 0 10*3/uL (ref 0.0–0.2)
Basos: 0 %
EOS (ABSOLUTE): 0 10*3/uL (ref 0.0–0.4)
Eos: 0 %
Hematocrit: 36.7 % (ref 34.0–46.6)
Hemoglobin: 10.7 g/dL — ABNORMAL LOW (ref 11.1–15.9)
Immature Grans (Abs): 0 10*3/uL (ref 0.0–0.1)
Immature Granulocytes: 0 %
Lymphocytes Absolute: 3.1 10*3/uL (ref 0.7–3.1)
Lymphs: 35 %
MCH: 21.2 pg — ABNORMAL LOW (ref 26.6–33.0)
MCHC: 29.2 g/dL — ABNORMAL LOW (ref 31.5–35.7)
MCV: 73 fL — ABNORMAL LOW (ref 79–97)
Monocytes Absolute: 0.6 10*3/uL (ref 0.1–0.9)
Monocytes: 6 %
Neutrophils Absolute: 5.2 10*3/uL (ref 1.4–7.0)
Neutrophils: 59 %
Platelets: 373 10*3/uL (ref 150–450)
RBC: 5.05 x10E6/uL (ref 3.77–5.28)
RDW: 16.5 % — ABNORMAL HIGH (ref 11.7–15.4)
WBC: 9 10*3/uL (ref 3.4–10.8)

## 2019-09-24 LAB — COMPREHENSIVE METABOLIC PANEL
ALT: 53 IU/L — ABNORMAL HIGH (ref 0–32)
AST: 37 IU/L (ref 0–40)
Albumin/Globulin Ratio: 1.4 (ref 1.2–2.2)
Albumin: 4.3 g/dL (ref 3.8–4.8)
Alkaline Phosphatase: 89 IU/L (ref 39–117)
BUN/Creatinine Ratio: 10 (ref 9–23)
BUN: 9 mg/dL (ref 6–20)
Bilirubin Total: 0.3 mg/dL (ref 0.0–1.2)
CO2: 21 mmol/L (ref 20–29)
Calcium: 9.1 mg/dL (ref 8.7–10.2)
Chloride: 106 mmol/L (ref 96–106)
Creatinine, Ser: 0.86 mg/dL (ref 0.57–1.00)
GFR calc Af Amer: 104 mL/min/{1.73_m2} (ref >59)
GFR calc non Af Amer: 90 mL/min/{1.73_m2} (ref >59)
Globulin, Total: 3 g/dL (ref 1.5–4.5)
Glucose: 102 mg/dL — ABNORMAL HIGH (ref 65–99)
Potassium: 4 mmol/L (ref 3.5–5.2)
Sodium: 141 mmol/L (ref 134–144)
Total Protein: 7.3 g/dL (ref 6.0–8.5)

## 2019-09-24 LAB — IRON,TIBC AND FERRITIN PANEL
Ferritin: 13 ng/mL — ABNORMAL LOW (ref 15–150)
Iron Saturation: 7 % — CL (ref 15–55)
Iron: 31 ug/dL (ref 27–159)
Total Iron Binding Capacity: 423 ug/dL (ref 250–450)
UIBC: 392 ug/dL (ref 131–425)

## 2019-09-24 LAB — TSH: TSH: 1.21 u[IU]/mL (ref 0.450–4.500)

## 2019-09-24 MED ORDER — FERROUS SULFATE ER 143 (45 FE) MG PO TBCR: EXTENDED_RELEASE_TABLET | ORAL | 0 refills | Status: DC

## 2019-09-24 NOTE — Telephone Encounter (Signed)
Referral for IDA placed to hem/onc. Extended release ferrous sulfate sent in to try in the mean time.

## 2019-09-28 ENCOUNTER — Encounter: Payer: Self-pay | Admitting: Physician Assistant

## 2019-09-28 ENCOUNTER — Telehealth: Payer: Self-pay | Admitting: Physician Assistant

## 2019-09-28 NOTE — Telephone Encounter (Signed)
[place] w/ [employer] contacted the office due to patient saying we requested a call from them for the weight loss medication. I advised [place] that we did not stated that to the patient. He states that the patient was denied due to not maintaining or loss weight when she was previously on the weight loss medication Phentermine before pregnancy. I also advised [place] that patients last office note was faxed over as well.[place] states that he was going to advise patient of another option. FYI

## 2019-09-28 NOTE — Telephone Encounter (Deleted)
[place] is calling regarding a PA for Deborah House- stating that she received an email that [employer] needs to contact the provider. Patient also

## 2019-10-02 ENCOUNTER — Other Ambulatory Visit: Payer: BC Managed Care – PPO

## 2019-10-02 ENCOUNTER — Inpatient Hospital Stay: Payer: BC Managed Care – PPO | Attending: Oncology | Admitting: Oncology

## 2019-10-02 ENCOUNTER — Other Ambulatory Visit: Payer: Self-pay

## 2019-10-02 ENCOUNTER — Encounter: Payer: Self-pay | Admitting: Oncology

## 2019-10-02 VITALS — BP 113/88 | HR 62 | Temp 95.8°F | Resp 20 | Wt 225.4 lb

## 2019-10-02 DIAGNOSIS — Z8041 Family history of malignant neoplasm of ovary: Secondary | ICD-10-CM | POA: Insufficient documentation

## 2019-10-02 DIAGNOSIS — Z833 Family history of diabetes mellitus: Secondary | ICD-10-CM | POA: Insufficient documentation

## 2019-10-02 DIAGNOSIS — D509 Iron deficiency anemia, unspecified: Secondary | ICD-10-CM | POA: Diagnosis not present

## 2019-10-02 DIAGNOSIS — Z79899 Other long term (current) drug therapy: Secondary | ICD-10-CM | POA: Insufficient documentation

## 2019-10-02 NOTE — Progress Notes (Signed)
Patient is here today for follow up. She denies any pain. States she has no energy and has been not getting enough rest. She states she is unable to take iron tablets because they make her sick.

## 2019-10-02 NOTE — Progress Notes (Signed)
[facility]  Telephone:([phone]) [phone] Fax:(336) [phone]  ID: Deborah House OB: [birth year]  MR#: [id number]  MOQ#:[id number]  Patient Care Team: Maryella Shivers as PCP - General (Physician Assistant) Jeralyn Ruths, MD as Consulting Physician (Oncology)  CHIEF COMPLAINT: Iron deficiency anemia.  INTERVAL HISTORY: Patient last evaluated in clinic in May 2019.  She is referred back for further evaluation and consideration of IV iron for her persistent iron deficiency anemia.  Patient recently gave birth to her second daughter.  She has increased weakness and fatigue, but otherwise feels well.  She has no neurologic complaints.  She denies any recent fevers or illnesses.  She has good appetite and denies weight loss.  She has no chest pain, shortness of breath, cough, or hemoptysis.  She denies any nausea, vomiting, constipation, or diarrhea.  She has no urinary complaints.  Patient otherwise feels well and offers no further specific complaints today.  REVIEW OF SYSTEMS:   Review of Systems  Constitutional: Positive for malaise/fatigue. Negative for fever and weight loss.  Respiratory: Negative.  Negative for cough and shortness of breath.   Cardiovascular: Negative.  Negative for chest pain and leg swelling.  Gastrointestinal: Negative.  Negative for abdominal pain, blood in stool and melena.  Genitourinary: Negative.  Negative for hematuria.  Musculoskeletal: Negative.  Negative for back pain.  Skin: Negative.  Negative for rash.  Neurological: Positive for weakness. Negative for dizziness, focal weakness and headaches.  Psychiatric/Behavioral: Negative.  The patient is not nervous/anxious.     As per HPI. Otherwise, a complete review of systems is negative.  PAST MEDICAL HISTORY: Past Medical History:  Diagnosis Date  . Abdominal pain   . Amenorrhea   . Anemia   . Chlamydia 2012   tx'd  . Family history of ovarian cancer 07/2015   genetic  testing letter sent  . Hemorrhoids   . Low-lying placenta in third trimester   . Missed abortion 06/01/2014   [facility]    PAST SURGICAL HISTORY: Past Surgical History:  Procedure Laterality Date  . CESAREAN SECTION N/A 06/27/2017   Procedure: CESAREAN SECTION;  Surgeon: [name], MD;  Location: [facility] ORS;  Service: Obstetrics;  Laterality: N/A;  . CESAREAN SECTION N/A 07/09/2019   Procedure: CESAREAN SECTION;  Surgeon: Nadara Mustard, MD;  Location: [facility] ORS;  Service: Obstetrics;  Laterality: N/A;  . DILATION AND CURETTAGE OF UTERUS  06/01/2014  . EYE SURGERY Left    when an infant    FAMILY HISTORY: Family History  Problem Relation Age of Onset  . Diabetes Maternal Grandmother   . Ovarian cancer Maternal Grandmother 60  . Cancer Maternal Grandmother        Ovarian cancer  . Anemia Mother   . Anemia Sister     ADVANCED DIRECTIVES (Y/N):  N  HEALTH MAINTENANCE: Social History   Tobacco Use  . Smoking status: Never Smoker  . Smokeless tobacco: Never Used  Substance Use Topics  . Alcohol use: No  . Drug use: No     Colonoscopy:  PAP:  Bone density:  Lipid panel:  Allergies  Allergen Reactions  . Ibuprofen Swelling     Swelling of face Suspected--unsure if it is a TRUE allergy.    Current Outpatient Medications  Medication Sig Dispense Refill  . acetaminophen (TYLENOL) 500 MG tablet Take 500 mg by mouth every 6 (six) hours as needed for moderate pain or headache.    . norethindrone (MICRONOR) 0.35 MG tablet Take 1  tablet (0.35 mg total) by mouth daily. 1 Package 11  . Phentermine-Topiramate (QSYMIA) 3.75-23 MG CP24 Take 1 pill daily for two weeks, then 2 pills once daily onward 45 capsule 0  . Prenatal MV-Min-FA-Omega-3 (PRENATAL GUMMIES/DHA & FA) 0.4-32.5 MG CHEW Chew 1 tablet by mouth daily.     . Ferrous Sulfate 143 (45 Fe) MG TBCR One pil three times daily. (Patient not taking: Reported on 10/02/2019) 90 tablet 0   No current facility-administered  medications for this visit.    OBJECTIVE: Vitals:   10/02/19 1354  BP: 113/88  Pulse: 62  Resp: 20  Temp: (!) 95.8 F (35.4 C)  SpO2: 100%     Body mass index is 36.38 kg/m.    ECOG FS:0 - Asymptomatic  General: Well-developed, well-nourished, no acute distress. Eyes: Pink conjunctiva, anicteric sclera. HEENT: Normocephalic, moist mucous membranes. Lungs: No audible wheezing or coughing. Heart: Regular rate and rhythm. Abdomen: Soft, nontender, no obvious distention. Musculoskeletal: No edema, cyanosis, or clubbing. Neuro: Alert, answering all questions appropriately. Cranial nerves grossly intact. Skin: No rashes or petechiae noted. Psych: Normal affect.   LAB RESULTS:  Lab Results  Component Value Date   NA 141 09/23/2019   K 4.0 09/23/2019   CL 106 09/23/2019   CO2 21 09/23/2019   GLUCOSE 102 (H) 09/23/2019   BUN 9 09/23/2019   CREATININE 0.86 09/23/2019   CALCIUM 9.1 09/23/2019   PROT 7.3 09/23/2019   ALBUMIN 4.3 09/23/2019   AST 37 09/23/2019   ALT 53 (H) 09/23/2019   ALKPHOS 89 09/23/2019   BILITOT 0.3 09/23/2019   GFRNONAA 90 09/23/2019   GFRAA 104 09/23/2019    Lab Results  Component Value Date   WBC 9.0 09/23/2019   NEUTROABS 5.2 09/23/2019   HGB 10.7 (L) 09/23/2019   HCT 36.7 09/23/2019   MCV 73 (L) 09/23/2019   PLT 373 09/23/2019   Lab Results  Component Value Date   IRON 31 09/23/2019   TIBC 423 09/23/2019   IRONPCTSAT 7 (LL) 09/23/2019    Lab Results  Component Value Date   FERRITIN 13 (L) 09/23/2019     STUDIES: No results found.  ASSESSMENT: Iron deficiency anemia.  PLAN:  1.  Iron deficiency anemia: Patient cannot tolerate oral iron supplementation.  Her hemoglobin and iron stores have trended down and she is symptomatic.  Patient will return to clinic in 1 and 2 weeks to receive 510 mg IV Feraheme.  She would then return to clinic in 4 months with repeat laboratory work, further evaluation, and continuation of treatment if  needed.   2.  Decreased MCV: Hemoglobinopathy profile was previously reported as normal.  I spent a total of 30 minutes reviewing chart data, face-to-face evaluation with the patient, counseling and coordination of care as detailed above.  Patient expressed understanding and was in agreement with this plan. She also understands that She can call clinic at any time with any questions, concerns, or complaints.    Lloyd Huger, MD   10/02/2019 6:59 PM

## 2019-10-06 ENCOUNTER — Encounter: Payer: Self-pay | Admitting: Physician Assistant

## 2019-10-06 DIAGNOSIS — E669 Obesity, unspecified: Secondary | ICD-10-CM

## 2019-10-07 ENCOUNTER — Inpatient Hospital Stay: Payer: BC Managed Care – PPO

## 2019-10-07 MED ORDER — TOPIRAMATE 50 MG PO TABS: ORAL_TABLET | ORAL | 2 refills | Status: DC

## 2019-10-09 ENCOUNTER — Other Ambulatory Visit: Payer: Self-pay

## 2019-10-09 ENCOUNTER — Inpatient Hospital Stay: Payer: BC Managed Care – PPO

## 2019-10-09 VITALS — BP 111/80 | HR 59 | Temp 97.1°F | Resp 18

## 2019-10-09 DIAGNOSIS — D509 Iron deficiency anemia, unspecified: Secondary | ICD-10-CM | POA: Diagnosis not present

## 2019-10-09 DIAGNOSIS — Z79899 Other long term (current) drug therapy: Secondary | ICD-10-CM | POA: Diagnosis not present

## 2019-10-09 DIAGNOSIS — Z833 Family history of diabetes mellitus: Secondary | ICD-10-CM | POA: Diagnosis not present

## 2019-10-09 DIAGNOSIS — Z8041 Family history of malignant neoplasm of ovary: Secondary | ICD-10-CM | POA: Diagnosis not present

## 2019-10-09 DIAGNOSIS — O99013 Anemia complicating pregnancy, third trimester: Secondary | ICD-10-CM

## 2019-10-09 MED ORDER — SODIUM CHLORIDE 0.9 % IV SOLN
INTRAVENOUS | Status: DC
  Filled 2019-10-09: qty 250

## 2019-10-09 MED ORDER — SODIUM CHLORIDE 0.9 % IV SOLN
510.0000 mg | Freq: Once | INTRAVENOUS | Status: AC
  Administered 2019-10-09: 510 mg via INTRAVENOUS
  Filled 2019-10-09: qty 510

## 2019-10-16 ENCOUNTER — Inpatient Hospital Stay: Payer: BC Managed Care – PPO

## 2019-10-16 ENCOUNTER — Other Ambulatory Visit: Payer: Self-pay

## 2019-10-16 VITALS — BP 117/75 | HR 89 | Temp 99.1°F | Resp 16

## 2019-10-16 DIAGNOSIS — D509 Iron deficiency anemia, unspecified: Secondary | ICD-10-CM | POA: Diagnosis not present

## 2019-10-16 DIAGNOSIS — O99013 Anemia complicating pregnancy, third trimester: Secondary | ICD-10-CM

## 2019-10-16 DIAGNOSIS — Z8041 Family history of malignant neoplasm of ovary: Secondary | ICD-10-CM | POA: Diagnosis not present

## 2019-10-16 DIAGNOSIS — Z833 Family history of diabetes mellitus: Secondary | ICD-10-CM | POA: Diagnosis not present

## 2019-10-16 DIAGNOSIS — Z79899 Other long term (current) drug therapy: Secondary | ICD-10-CM | POA: Diagnosis not present

## 2019-10-16 MED ORDER — SODIUM CHLORIDE 0.9 % IV SOLN
510.0000 mg | Freq: Once | INTRAVENOUS | Status: AC
  Administered 2019-10-16: 510 mg via INTRAVENOUS
  Filled 2019-10-16: qty 510

## 2019-10-16 MED ORDER — SODIUM CHLORIDE 0.9 % IV SOLN
INTRAVENOUS | Status: DC
  Filled 2019-10-16: qty 250

## 2019-11-06 ENCOUNTER — Other Ambulatory Visit: Payer: Self-pay | Admitting: Physician Assistant

## 2019-11-06 DIAGNOSIS — E669 Obesity, unspecified: Secondary | ICD-10-CM

## 2019-11-09 ENCOUNTER — Other Ambulatory Visit: Payer: Self-pay | Admitting: Family Medicine

## 2019-11-18 ENCOUNTER — Telehealth: Payer: Self-pay

## 2019-11-18 NOTE — Telephone Encounter (Signed)
Patient is schedule for July 29 th with AMS

## 2019-11-18 NOTE — Telephone Encounter (Signed)
Can you schedule an appointment for pt with AMS for irregular periods after pregnancy, also need weight loss meds,. Pt aware you will be calling her soon

## 2019-11-24 ENCOUNTER — Encounter: Payer: Self-pay | Admitting: Physician Assistant

## 2019-11-24 ENCOUNTER — Ambulatory Visit (INDEPENDENT_AMBULATORY_CARE_PROVIDER_SITE_OTHER): Payer: BC Managed Care – PPO | Admitting: Physician Assistant

## 2019-11-24 ENCOUNTER — Other Ambulatory Visit: Payer: Self-pay

## 2019-11-24 VITALS — BP 118/78 | HR 75 | Temp 98.0°F | Wt 228.0 lb

## 2019-11-24 DIAGNOSIS — E669 Obesity, unspecified: Secondary | ICD-10-CM | POA: Diagnosis not present

## 2019-11-24 NOTE — Progress Notes (Signed)
Established patient visit   Patient: Deborah House   DOB: [birth year]   29 y.o. Female  MRN: [id number] Visit Date: 11/24/2019  Today's healthcare provider: Trey Sailors, PA-C   Chief Complaint  Patient presents with  . Weight Loss  I,Deborah House,acting as a scribe for [employer], PA-C.,have documented all relevant documentation on the behalf of Deborah Sailors, PA-C,as directed by  Deborah Sailors, PA-C while in the presence of Deborah Sailors, PA-C.  Subjective    HPI  Weight loss management  Patient presents today for a follow up. She was last seen on 09/23/2019. Patient insurance denied the Qsymia 3.75-23 MG and she was prescribed Topiramate 50 MG. Patient reports she stopped taking the Topiramate on 11/20/2019,due to a possible panic attack. Patient reports good compliance with treatment before stopping the medication. Patient states she has stopped eating out and drinking a lot of water. Patient reports she will eat late in the afternoon.   Patient states that she has not had her menstrual cycle and is seeing her gynecologist.   Wt Readings from Last 3 Encounters:  11/24/19 228 lb (103.4 kg)  10/02/19 225 lb 6.4 oz (102.2 kg)  09/23/19 224 lb (101.6 kg)        Medications: Outpatient Medications Prior to Visit  Medication Sig  . acetaminophen (TYLENOL) 500 MG tablet Take 500 mg by mouth every 6 (six) hours as needed for moderate pain or headache.  . Cyanocobalamin (VITAMIN B12 PO) Take by mouth.  . Ferrous Sulfate 143 (45 Fe) MG TBCR One pil three times daily. (Patient not taking: Reported on 10/02/2019)  . norethindrone (MICRONOR) 0.35 MG tablet Take 1 tablet (0.35 mg total) by mouth daily. (Patient not taking: Reported on 11/24/2019)  . Phentermine-Topiramate (QSYMIA) 3.75-23 MG CP24 Take 1 pill daily for two weeks, then 2 pills once daily onward (Patient not taking: Reported on 11/24/2019)  . Prenatal MV-Min-FA-Omega-3 (PRENATAL GUMMIES/DHA & FA)  0.4-32.5 MG CHEW Chew 1 tablet by mouth daily.  (Patient not taking: Reported on 11/24/2019)  . topiramate (TOPAMAX) 50 MG tablet Take 2 tablets (100 mg total) by mouth daily. TAKE 1/2 TABLET EVERY MORNING FOR 14 DAYS, THEN INCREASE TO 1 TABLET EVERY MORNING FOR 14 DAYS, THEN 2 TABLETS EVERY MORNING (Patient not taking: Reported on 11/24/2019)   No facility-administered medications prior to visit.    Review of Systems  Constitutional: Negative.   Respiratory: Negative.   Cardiovascular: Negative.   Hematological: Negative.       Objective    BP 118/78 (BP Location: Left Arm, Patient Position: Sitting, Cuff Size: Normal)   Pulse 75   Temp 98 F (36.7 C) (Oral)   Wt 228 lb (103.4 kg)   SpO2 98%   BMI 36.80 kg/m    Physical Exam Constitutional:      Appearance: Normal appearance.  Cardiovascular:     Rate and Rhythm: Normal rate and regular rhythm.     Pulses: Normal pulses.     Heart sounds: Normal heart sounds.  Pulmonary:     Effort: Pulmonary effort is normal.     Breath sounds: Normal breath sounds.  Skin:    General: Skin is warm and dry.  Neurological:     General: No focal deficit present.     Mental Status: She is alert and oriented to person, place, and time.  Psychiatric:        Mood and Affect: Mood normal.  Behavior: Behavior normal.       No results found for any visits on 11/24/19.  Assessment & Plan    1. Obesity (BMI 30-39.9) Patient was taking Topiramate 50 MG daily and stopped taking due to having panic attack. Patient states that she does not want to be put on any more medication. Discontinue topamax.  She would likely benefit more from intensive education and lifestyle changes. Patient was advised she could be referred to weight loss clinic and patient declined. She would like to talk to OBGYN first. May message back if she would like this referral.     Return if symptoms worsen or fail to improve.      ITrey Sailors, PA-C, have  reviewed all documentation for this visit. The documentation on 11/25/19 for the exam, diagnosis, procedures, and orders are all accurate and complete.    Maryella Shivers  [facility] [phone] (phone) [phone] (fax)  [facility]

## 2019-11-24 NOTE — Patient Instructions (Signed)

## 2019-11-30 NOTE — Telephone Encounter (Signed)
Appointment sometime in the next 1-2 weeks

## 2019-11-30 NOTE — Telephone Encounter (Signed)
Patient is scheduled for 12/17/19 with AMS

## 2019-12-09 ENCOUNTER — Other Ambulatory Visit: Payer: Self-pay | Admitting: Family Medicine

## 2019-12-09 DIAGNOSIS — E669 Obesity, unspecified: Secondary | ICD-10-CM

## 2019-12-17 ENCOUNTER — Encounter: Payer: Self-pay | Admitting: Obstetrics and Gynecology

## 2019-12-17 ENCOUNTER — Ambulatory Visit (INDEPENDENT_AMBULATORY_CARE_PROVIDER_SITE_OTHER): Payer: BC Managed Care – PPO | Admitting: Obstetrics and Gynecology

## 2019-12-17 ENCOUNTER — Other Ambulatory Visit: Payer: Self-pay

## 2019-12-17 VITALS — BP 120/68 | HR 66 | Ht 66.0 in | Wt 232.0 lb

## 2019-12-17 DIAGNOSIS — E669 Obesity, unspecified: Secondary | ICD-10-CM | POA: Diagnosis not present

## 2019-12-17 DIAGNOSIS — N926 Irregular menstruation, unspecified: Secondary | ICD-10-CM | POA: Diagnosis not present

## 2019-12-17 DIAGNOSIS — Z6837 Body mass index (BMI) 37.0-37.9, adult: Secondary | ICD-10-CM

## 2019-12-17 DIAGNOSIS — N911 Secondary amenorrhea: Secondary | ICD-10-CM | POA: Diagnosis not present

## 2019-12-17 LAB — POCT URINE PREGNANCY: Preg Test, Ur: NEGATIVE

## 2019-12-17 MED ORDER — PHENTERMINE HCL 37.5 MG PO TABS: 37.5000 mg | ORAL_TABLET | Freq: Every day | ORAL | 0 refills | Status: DC

## 2019-12-17 NOTE — Progress Notes (Signed)
Obstetrics & Gynecology Office Visit   Chief Complaint:  Chief Complaint  Patient presents with  . Metrorrhagia    c/s 06/2019, CYCLES IRREGULAR SINCE THEN, NEG. UPT    History of Present Illness: 29 y.o. G3P1011 presenting for the evaluation of absent menstruation.  The patient report No LMP recorded..  Preceding the current episode of amenorrhea, the patient's menstrual cycles had been irregular.  The patient is currently sexually active and is not using any hormonal contraception.  She does not have any contributing past medical history.  The patient has not started any new medications coinciding with the onset of her symptoms.  The patient is not interested in conceiving in the near future.  She does not exercise excessively.  PCOS/Cushings Wt Change: yes about 10lbs sinnce delivery Hirsutism: no Acne: yes Balding: no Lipodystrophy:  no Acanthosis nigricans:  no Striae:  no  Hyperprolactinemia Galactorrhea: yes Headaches: no Vision Changes:  no Prior obstetric hemorrhage: no  Thyroid Temperature Intolerance: no Constipation or Diarrhea: no Hair Thinning:  no Palpitations:  {yes/no:63)  Outlet Obstruction: Prior D&C: no Prior myomectomy: no Prior LEEP or CKC: no   Patientis a 29 y.o. G45P1011 female, who presents for the evaluation of weight gain. She has gained 10 pounds primarily over 6 months. The patient states the following issues have contributed to her weight problem: postpartum period, decreased time for physical activity.  The patient has no additional symptoms. The patient specifically denies memory loss, muscle weakness, excessive thirst, and polyuria. Weight related co-morbidities include none. The patient's past medical history is notable for none. She has tried phentermine interventions in the past with moderate success.   Review of Systems: Review of Systems  Constitutional: Negative.   Gastrointestinal: Negative.   Genitourinary: Negative.     Neurological: Negative for headaches.  Psychiatric/Behavioral: Negative.    Past Medical History:  Patient Active Problem List   Diagnosis Date Noted  . History of cesarean delivery 07/09/2019  . BMI 33.0-33.9,adult 05/07/2019  . Rh negative state in antepartum period 12/09/2018  . History of cesarean section complicating pregnancy 12/09/2018  . Supervision of other normal pregnancy, antepartum 11/10/2018    Clinic [facility] Prenatal Labs  Dating 7 week [place] Blood type: O/Negative/-- (06/17 1536)   Genetic Screen    NIPS:Normal XX Antibody:Negative (06/17 1536)  Anatomic [place] WSOB Rubella: 2.06 (06/17 1536) Varicella: Immune  GTT nml RPR: Non Reactive (06/17 1536)   Rhogam [x]  28 weeks 11/18 HBsAg: Negative (06/17 1536)   Vaccines TDAP:12/17                       Flu Shot: 11/18 HIV: Non Reactive (06/17 1536)   Baby Food                     Breast           GBS:   Contraception  Uncertain Pap: 11/07/2016 NIL  CBB  No   CS/VBAC [x] VBAC if labors, CS 41 weeks if not   Support Person Jeanmychal        . Pregnancy with history of cesarean section, antepartum 11/10/2018  . Obesity affecting pregnancy, antepartum 11/10/2018  . Anemia affecting pregnancy 05/15/2017    Hgb 8.0 at 28 weeks [X]  hematology referral     Past Surgical History:  Past Surgical History:  Procedure Laterality Date  . CESAREAN SECTION N/A 06/27/2017   Procedure: CESAREAN SECTION;  Surgeon: 05/17/2017, MD;  Location: [facility] ORS;  Service: Obstetrics;  Laterality: N/A;  . CESAREAN SECTION N/A 07/09/2019   Procedure: CESAREAN SECTION;  Surgeon: Nadara Mustard, MD;  Location: [facility] ORS;  Service: Obstetrics;  Laterality: N/A;  . DILATION AND CURETTAGE OF UTERUS  06/01/2014  . EYE SURGERY Left    when an infant    Gynecologic History: No LMP recorded.  Obstetric History: G3P1011  Family History:  Family History  Problem Relation Age of Onset  . Diabetes Maternal Grandmother   . Ovarian cancer Maternal  Grandmother 60  . Cancer Maternal Grandmother        Ovarian cancer  . Anemia Mother   . Anemia Sister     Social History:  Social History   Socioeconomic History  . Marital status: Married    Spouse name: Not on file  . Number of children: 0  . Years of education: 91  . Highest education level: Not on file  Occupational History  . Occupation: RETAIL SALES  Tobacco Use  . Smoking status: Never Smoker  . Smokeless tobacco: Never Used  Vaping Use  . Vaping Use: Never used  Substance and Sexual Activity  . Alcohol use: No  . Drug use: No  . Sexual activity: Yes    Birth control/protection: None  Other Topics Concern  . Not on file  Social History Narrative  . Not on file   Social Determinants of Health   Financial Resource Strain:   . Difficulty of Paying Living Expenses:   Food Insecurity:   . Worried About Programme researcher, broadcasting/film/video in the Last Year:   . Barista in the Last Year:   Transportation Needs:   . Freight forwarder (Medical):   Marland Kitchen Lack of Transportation (Non-Medical):   Physical Activity:   . Days of Exercise per Week:   . Minutes of Exercise per Session:   Stress:   . Feeling of Stress :   Social Connections:   . Frequency of Communication with Friends and Family:   . Frequency of Social Gatherings with Friends and Family:   . Attends Religious Services:   . Active Member of Clubs or Organizations:   . Attends Banker Meetings:   Marland Kitchen Marital Status:   Intimate Partner Violence:   . Fear of Current or Ex-Partner:   . Emotionally Abused:   Marland Kitchen Physically Abused:   . Sexually Abused:     Allergies:  Allergies  Allergen Reactions  . Ibuprofen Swelling     Swelling of face Suspected--unsure if it is a TRUE allergy.    Medications: Prior to Admission medications   Not on File    Physical Exam Blood pressure 120/68, pulse 66, height 5\' 6"  (1.676 m), weight (!) 232 lb (105.2 kg), not currently breastfeeding. Body mass index  is 37.45 kg/m.  No LMP recorded. Body mass index is 37.45 kg/m.  General: NAD HEENT: normocephalic, anicteric Pulmonary: No increased work of breathing Extremities: no edema, erythema, or tenderness Neurologic: Grossly intact Psychiatric: mood appropriate, affect full  Female chaperone present for pelvic portion of the physical exam  Assessment: 29 y.o. 36 presenting with secondary amenorrhea  Plan: Problem List Items Addressed This Visit    None    Visit Diagnoses    Irregular menses    -  Primary   Relevant Orders   POCT urine pregnancy (Completed)   Secondary amenorrhea       Relevant Orders   Hemoglobin A1c   Beta  hCG quant (ref lab)   TSH+Prl+FSH+TestT+LH+DHEA S...   Class 2 obesity without serious comorbidity with body mass index (BMI) of 37.0 to 37.9 in adult, unspecified obesity type       Relevant Medications   phentermine (ADIPEX-P) 37.5 MG tablet   Other Relevant Orders   Hemoglobin A1c   TSH+Prl+FSH+TestT+LH+DHEA S...     Secondary Amenorrhea 1) The risk long term risk of amenorrhea were discussed with the patient.  The role of unopposed estrogen in causing amenorrhea and the possible development of endometrial hyperplasia or carcinoma is discussed.  The risk of endometrial hyperplasia is linearly correlated with increasing BMI given the production of estrone by adipose tissue.    2) Although amenorrhea is the patients presenting complaint, we discussed that rather than simply addressing her symptom the goal of our work up would be aimed at identifying the underlying cause.  Disruption in the axis of any number of endocrine systems may evidence itself in the form of amenorrhea.  4) Ultrasound ordered for endometrial strip assessment and evaluation of ovaries  Reports ACNE, hirsutism.  No longer breast feeding no longer on micronor.  No headaches, visions changes, still with some breast discharge.  Declines contraception wants to see if cycles regulate  with weight loss  Obesity  1) 1500 Calorie ADA Diet  2) Patient education given regarding appropriate lifestyle changes for weight loss including: regular physical activity, healthy coping strategies, caloric restriction and healthy eating patterns.  3) Patient will be started on weight loss medication. The risks and benefits and side effects of medication, such as Adipex (Phenteramine) ,  Tenuate (Diethylproprion), Belviq (lorcarsin), Contrave (buproprion/naltrexone), Qsymia (phentermine/topiramate), and Saxenda (liraglutide) is discussed. The pros and cons of suppressing appetite and boosting metabolism is discussed. Risks of tolerence and addiction is discussed for selected agents discussed. Use of medicine will ne short term, such as 3-4 months at a time followed by a period of time off of the medicine to avoid these risks and side effects for Adipex, Qsymia, and Tenuate discussed. Pt to call with any negative side effects and agrees to keep follow up appts.  4) Encouraged weekly weight monitorig to track progress and sample 1 week food diary  5) 15 minutes face-to-face; counseling/coordination of care > 50 percent of visit  6) Follow up in 4 weeks to assess response   [name], MD, [facility], [facility] 12/17/2019, 10:21 AM

## 2019-12-22 LAB — TSH+PRL+FSH+TESTT+LH+DHEA S...
17-Hydroxyprogesterone: 11 ng/dL
Androstenedione: 31 ng/dL — ABNORMAL LOW (ref 41–262)
DHEA-SO4: 44.3 ug/dL — ABNORMAL LOW (ref 84.8–378.0)
FSH: 11.7 m[IU]/mL
LH: 7.2 m[IU]/mL
Prolactin: 1 ng/mL — ABNORMAL LOW (ref 4.8–23.3)
TSH: 2.05 u[IU]/mL (ref 0.450–4.500)
Testosterone, Free: 0.8 pg/mL (ref 0.0–4.2)
Testosterone: 5 ng/dL — ABNORMAL LOW (ref 8–60)

## 2019-12-22 LAB — HEMOGLOBIN A1C
Est. average glucose Bld gHb Est-mCnc: 117 mg/dL
Hgb A1c MFr Bld: 5.7 % — ABNORMAL HIGH (ref 4.8–5.6)

## 2019-12-22 LAB — BETA HCG QUANT (REF LAB): hCG Quant: <1 m[IU]/mL

## 2020-01-13 ENCOUNTER — Ambulatory Visit (INDEPENDENT_AMBULATORY_CARE_PROVIDER_SITE_OTHER): Payer: BC Managed Care – PPO | Admitting: Obstetrics and Gynecology

## 2020-01-13 ENCOUNTER — Encounter: Payer: Self-pay | Admitting: Obstetrics and Gynecology

## 2020-01-13 ENCOUNTER — Other Ambulatory Visit: Payer: Self-pay

## 2020-01-13 VITALS — Ht 66.0 in | Wt 221.0 lb

## 2020-01-13 DIAGNOSIS — E669 Obesity, unspecified: Secondary | ICD-10-CM | POA: Diagnosis not present

## 2020-01-13 DIAGNOSIS — Z6835 Body mass index (BMI) 35.0-35.9, adult: Secondary | ICD-10-CM

## 2020-01-13 DIAGNOSIS — L700 Acne vulgaris: Secondary | ICD-10-CM

## 2020-01-13 MED ORDER — DOXYCYCLINE HYCLATE 100 MG PO CAPS: 100.0000 mg | ORAL_CAPSULE | Freq: Every day | ORAL | 0 refills | Status: AC

## 2020-01-13 MED ORDER — PHENTERMINE HCL 37.5 MG PO TABS: 37.5000 mg | ORAL_TABLET | Freq: Every day | ORAL | 0 refills | Status: DC

## 2020-01-13 NOTE — Progress Notes (Signed)
I connected with Deborah House on 01/13/20 at  2:10 PM EDT by telephone and verified that I am speaking with the correct person using two identifiers.   I discussed the limitations, risks, security and privacy concerns of performing an evaluation and management service by telephone and the availability of in person appointments. I also discussed with the patient that there may be a patient responsible charge related to this service. The patient expressed understanding and agreed to proceed.  The patient was at home I spoke with the patient from my workstation phone The names of people involved in this encounter were: Deborah House , and Deborah House   Gynecology Office Visit  Chief Complaint:  Chief Complaint  Patient presents with  . Weight Check    History of Present Illness: Patientis a 29 y.o. G20P1011 female, who presents for the evaluation of the desire to lose weight. She has lost 11 pounds 1 months. The patient states the following symptoms since starting her weight loss therapy: appetite suppression, energy, and weight loss.  The patient also reports no other ill effects. The patient specifically denies heart palpitations, anxiety, and insomnia.   Has noted more ACNE face, back, upper and lower extremities    Review of Systems: 10 point review of systems negative unless otherwise noted in HPI  Past Medical History:  Past Medical History:  Diagnosis Date  . Abdominal pain   . Amenorrhea   . Anemia   . Chlamydia 2012   tx'd  . Family history of ovarian cancer 07/2015   genetic testing letter sent  . Hemorrhoids   . Low-lying placenta in third trimester   . Missed abortion 06/01/2014   [facility]    Past Surgical History:  Past Surgical History:  Procedure Laterality Date  . CESAREAN SECTION N/A 06/27/2017   Procedure: CESAREAN SECTION;  Surgeon: Deborah Austria, MD;  Location: [facility] ORS;  Service: Obstetrics;  Laterality: N/A;  . CESAREAN SECTION  N/A 07/09/2019   Procedure: CESAREAN SECTION;  Surgeon: Nadara Mustard, MD;  Location: [facility] ORS;  Service: Obstetrics;  Laterality: N/A;  . DILATION AND CURETTAGE OF UTERUS  06/01/2014  . EYE SURGERY Left    when an infant    Gynecologic History: No LMP recorded.  Obstetric History: G3P1011  Family History:  Family History  Problem Relation Age of Onset  . Diabetes Maternal Grandmother   . Ovarian cancer Maternal Grandmother 60  . Cancer Maternal Grandmother        Ovarian cancer  . Anemia Mother   . Anemia Sister     Social History:  Social History   Socioeconomic History  . Marital status: Married    Spouse name: Not on file  . Number of children: 0  . Years of education: 36  . Highest education level: Not on file  Occupational History  . Occupation: RETAIL SALES  Tobacco Use  . Smoking status: Never Smoker  . Smokeless tobacco: Never Used  Vaping Use  . Vaping Use: Never used  Substance and Sexual Activity  . Alcohol use: No  . Drug use: No  . Sexual activity: Yes    Birth control/protection: None  Other Topics Concern  . Not on file  Social History Narrative  . Not on file   Social Determinants of Health   Financial Resource Strain:   . Difficulty of Paying Living Expenses: Not on file  Food Insecurity:   . Worried About Programme researcher, broadcasting/film/video in the  Last Year: Not on file  . Ran Out of Food in the Last Year: Not on file  Transportation Needs:   . Lack of Transportation (Medical): Not on file  . Lack of Transportation (Non-Medical): Not on file  Physical Activity:   . Days of Exercise per Week: Not on file  . Minutes of Exercise per Session: Not on file  Stress:   . Feeling of Stress : Not on file  Social Connections:   . Frequency of Communication with Friends and Family: Not on file  . Frequency of Social Gatherings with Friends and Family: Not on file  . Attends Religious Services: Not on file  . Active Member of Clubs or Organizations: Not on  file  . Attends Banker Meetings: Not on file  . Marital Status: Not on file  Intimate Partner Violence:   . Fear of Current or Ex-Partner: Not on file  . Emotionally Abused: Not on file  . Physically Abused: Not on file  . Sexually Abused: Not on file    Allergies:  Allergies  Allergen Reactions  . Ibuprofen Swelling     Swelling of face Suspected--unsure if it is a TRUE allergy.    Medications: Prior to Admission medications   Medication Sig Start Date End Date Taking? Authorizing Provider  phentermine (ADIPEX-P) 37.5 MG tablet Take 1 tablet (37.5 mg total) by mouth daily before breakfast. 12/17/19   Deborah Austria, MD    Physical Exam Height 5\' 6"  (1.676 m), weight 221 lb (100.2 kg). Wt Readings from Last 3 Encounters:  01/13/20 221 lb (100.2 kg)  12/17/19 (!) 232 lb (105.2 kg)  11/24/19 228 lb (103.4 kg)  Body mass index is 35.67 kg/m.  No physical exam as this was a remote telephone visit to promote social distancing during the current COVID-19 Pandemic   Assessment: 29 y.o. 31 No problem-specific Assessment & Plan notes found for this encounter.   Plan: Problem List Items Addressed This Visit    None    Visit Diagnoses    Cystic acne vulgaris    -  Primary   Class 2 obesity without serious comorbidity with body mass index (BMI) of 35.0 to 35.9 in adult, unspecified obesity type         Weight loss  1) 1500 Calorie ADA Diet  2) Patient education given regarding appropriate lifestyle changes for weight loss including: regular physical activity, healthy coping strategies, caloric restriction and healthy eating patterns.  3) Patient to take medication, with the benefits of appetite suppression and metabolism boost d/w pt, along with the side effects and risk factors of long term use that will be avoided with our use of short bursts of therapy. Rx provided.    4) Telephone time 5:00 minutes  5) Follow up in 4 weeks  Acne  1) Acne  mutliple sites (face, back, upper and lower extremities)  - will start with systemic treament doxycyline 100mg  po daily.     [id number], MD, [facility], [facility] 01/13/2020, 2:19 PM

## 2020-01-29 ENCOUNTER — Other Ambulatory Visit: Payer: Self-pay | Admitting: *Deleted

## 2020-01-29 DIAGNOSIS — D509 Iron deficiency anemia, unspecified: Secondary | ICD-10-CM

## 2020-01-30 NOTE — Progress Notes (Signed)
[facility]  Telephone:([phone] Fax:([phone]) [phone]  ID: Deborah House OB: 11/26/1990  MR#: [id number]  SWN#:[id number]  Patient Care Team: Maryella Shivers as PCP - General (Physician Assistant) Jeralyn Ruths, MD as Consulting Physician (Oncology)  CHIEF COMPLAINT: Iron deficiency anemia.  INTERVAL HISTORY: Patient returns to clinic today for repeat laboratory, further evaluation, and continuation of IV Feraheme.  She continues to have weakness and fatigue, but works third shift and has a 68-month-old daughter at home.  She otherwise feels well.  She has no neurologic complaints.  She denies any recent fevers or illnesses.  She has a good appetite and denies weight loss.  She has no chest pain, shortness of breath, cough, or hemoptysis.  She denies any nausea, vomiting, constipation, or diarrhea.  She has no melena or hematochezia.  She has no urinary complaints.  Patient offers no further specific complaints today.  REVIEW OF SYSTEMS:   Review of Systems  Constitutional: Positive for malaise/fatigue. Negative for fever and weight loss.  Respiratory: Negative.  Negative for cough and shortness of breath.   Cardiovascular: Negative.  Negative for chest pain and leg swelling.  Gastrointestinal: Negative.  Negative for abdominal pain, blood in stool and melena.  Genitourinary: Negative.  Negative for hematuria.  Musculoskeletal: Negative.  Negative for back pain.  Skin: Negative.  Negative for rash.  Neurological: Positive for weakness. Negative for dizziness, focal weakness and headaches.  Psychiatric/Behavioral: Negative.  The patient is not nervous/anxious.     As per HPI. Otherwise, a complete review of systems is negative.  PAST MEDICAL HISTORY: Past Medical History:  Diagnosis Date  . Abdominal pain   . Amenorrhea   . Anemia   . Chlamydia 2012   tx'd  . Family history of ovarian cancer 07/2015   genetic testing letter sent  .  Hemorrhoids   . Low-lying placenta in third trimester   . Missed abortion 06/01/2014   [facility]    PAST SURGICAL HISTORY: Past Surgical History:  Procedure Laterality Date  . CESAREAN SECTION N/A 06/27/2017   Procedure: CESAREAN SECTION;  Surgeon: [name], MD;  Location: [facility] ORS;  Service: Obstetrics;  Laterality: N/A;  . CESAREAN SECTION N/A 07/09/2019   Procedure: CESAREAN SECTION;  Surgeon: Nadara Mustard, MD;  Location: [facility] ORS;  Service: Obstetrics;  Laterality: N/A;  . DILATION AND CURETTAGE OF UTERUS  06/01/2014  . EYE SURGERY Left    when an infant    FAMILY HISTORY: Family History  Problem Relation Age of Onset  . Diabetes Maternal Grandmother   . Ovarian cancer Maternal Grandmother 60  . Cancer Maternal Grandmother        Ovarian cancer  . Anemia Mother   . Anemia Sister     ADVANCED DIRECTIVES (Y/N):  N  HEALTH MAINTENANCE: Social History   Tobacco Use  . Smoking status: Never Smoker  . Smokeless tobacco: Never Used  Vaping Use  . Vaping Use: Never used  Substance Use Topics  . Alcohol use: No  . Drug use: No     Colonoscopy:  PAP:  Bone density:  Lipid panel:  Allergies  Allergen Reactions  . Ibuprofen Swelling     Swelling of face Suspected--unsure if it is a TRUE allergy.    Current Outpatient Medications  Medication Sig Dispense Refill  . doxycycline (VIBRAMYCIN) 100 MG capsule Take 1 capsule (100 mg total) by mouth daily. 30 capsule 0  . phentermine (ADIPEX-P) 37.5 MG tablet Take 1 tablet (  37.5 mg total) by mouth daily before breakfast. 30 tablet 0   No current facility-administered medications for this visit.    OBJECTIVE: Vitals:   02/01/20 1326  BP: 115/71  Pulse: 91  Resp: 20  Temp: 97.8 F (36.6 C)  SpO2: 100%     Body mass index is 35.73 kg/m.    ECOG FS:0 - Asymptomatic  General: Well-developed, well-nourished, no acute distress. Eyes: Pink conjunctiva, anicteric sclera. HEENT: Normocephalic, moist mucous  membranes. Lungs: No audible wheezing or coughing. Heart: Regular rate and rhythm. Abdomen: Soft, nontender, no obvious distention. Musculoskeletal: No edema, cyanosis, or clubbing. Neuro: Alert, answering all questions appropriately. Cranial nerves grossly intact. Skin: No rashes or petechiae noted. Psych: Normal affect.   LAB RESULTS:  Lab Results  Component Value Date   NA 141 09/23/2019   K 4.0 09/23/2019   CL 106 09/23/2019   CO2 21 09/23/2019   GLUCOSE 102 (H) 09/23/2019   BUN 9 09/23/2019   CREATININE 0.86 09/23/2019   CALCIUM 9.1 09/23/2019   PROT 7.3 09/23/2019   ALBUMIN 4.3 09/23/2019   AST 37 09/23/2019   ALT 53 (H) 09/23/2019   ALKPHOS 89 09/23/2019   BILITOT 0.3 09/23/2019   GFRNONAA 90 09/23/2019   GFRAA 104 09/23/2019    Lab Results  Component Value Date   WBC 8.6 02/01/2020   NEUTROABS 4.1 02/01/2020   HGB 13.3 02/01/2020   HCT 41.3 02/01/2020   MCV 79.0 (L) 02/01/2020   PLT 267 02/01/2020   Lab Results  Component Value Date   IRON 86 02/01/2020   TIBC 321 02/01/2020   IRONPCTSAT 27 02/01/2020    Lab Results  Component Value Date   FERRITIN 143 02/01/2020     STUDIES: No results found.  ASSESSMENT: Iron deficiency anemia.  PLAN:  1.  Iron deficiency anemia: Patient cannot tolerate oral iron supplementation.  Patient's hemoglobin and iron stores are now within normal limits.  She does not require additional Feraheme today.  Patient last received treatment on Oct 16, 2019.  Return to clinic in 4 months with repeat laboratory work, video assisted telemedicine visit, and consideration of additional treatment if needed.     2.  Decreased MCV: Improved with iron infusion.  Hemoglobinopathy profile was previously reported as normal.  I spent a total of 20 minutes reviewing chart data, face-to-face evaluation with the patient, counseling and coordination of care as detailed above.   Patient expressed understanding and was in agreement with  this plan. She also understands that She can call clinic at any time with any questions, concerns, or complaints.    Jeralyn Ruths, MD   02/01/2020 3:25 PM

## 2020-02-01 ENCOUNTER — Other Ambulatory Visit: Payer: Self-pay

## 2020-02-01 ENCOUNTER — Encounter: Payer: Self-pay | Admitting: Oncology

## 2020-02-01 ENCOUNTER — Inpatient Hospital Stay: Payer: BC Managed Care – PPO

## 2020-02-01 ENCOUNTER — Inpatient Hospital Stay (HOSPITAL_BASED_OUTPATIENT_CLINIC_OR_DEPARTMENT_OTHER): Payer: BC Managed Care – PPO | Admitting: Oncology

## 2020-02-01 ENCOUNTER — Inpatient Hospital Stay: Payer: BC Managed Care – PPO | Attending: Oncology

## 2020-02-01 VITALS — BP 115/71 | HR 91 | Temp 97.8°F | Resp 20 | Wt 221.4 lb

## 2020-02-01 DIAGNOSIS — Z8041 Family history of malignant neoplasm of ovary: Secondary | ICD-10-CM | POA: Diagnosis not present

## 2020-02-01 DIAGNOSIS — Z833 Family history of diabetes mellitus: Secondary | ICD-10-CM | POA: Diagnosis not present

## 2020-02-01 DIAGNOSIS — Z79899 Other long term (current) drug therapy: Secondary | ICD-10-CM | POA: Insufficient documentation

## 2020-02-01 DIAGNOSIS — D509 Iron deficiency anemia, unspecified: Secondary | ICD-10-CM | POA: Diagnosis not present

## 2020-02-01 LAB — IRON AND TIBC
Iron: 86 ug/dL (ref 28–170)
Saturation Ratios: 27 % (ref 10.4–31.8)
TIBC: 321 ug/dL (ref 250–450)
UIBC: 235 ug/dL

## 2020-02-01 LAB — CBC WITH DIFFERENTIAL/PLATELET
Abs Immature Granulocytes: 0.01 10*3/uL (ref 0.00–0.07)
Basophils Absolute: 0 10*3/uL (ref 0.0–0.1)
Basophils Relative: 0 %
Eosinophils Absolute: 0 10*3/uL (ref 0.0–0.5)
Eosinophils Relative: 0 %
HCT: 41.3 % (ref 36.0–46.0)
Hemoglobin: 13.3 g/dL (ref 12.0–15.0)
Immature Granulocytes: 0 %
Lymphocytes Relative: 46 %
Lymphs Abs: 3.9 10*3/uL (ref 0.7–4.0)
MCH: 25.4 pg — ABNORMAL LOW (ref 26.0–34.0)
MCHC: 32.2 g/dL (ref 30.0–36.0)
MCV: 79 fL — ABNORMAL LOW (ref 80.0–100.0)
Monocytes Absolute: 0.6 10*3/uL (ref 0.1–1.0)
Monocytes Relative: 6 %
Neutro Abs: 4.1 10*3/uL (ref 1.7–7.7)
Neutrophils Relative %: 48 %
Platelets: 267 10*3/uL (ref 150–400)
RBC: 5.23 MIL/uL — ABNORMAL HIGH (ref 3.87–5.11)
RDW: 13.9 % (ref 11.5–15.5)
WBC: 8.6 10*3/uL (ref 4.0–10.5)
nRBC: 0 % (ref 0.0–0.2)

## 2020-02-01 LAB — FERRITIN: Ferritin: 143 ng/mL (ref 11–307)

## 2020-02-10 ENCOUNTER — Encounter: Payer: Self-pay | Admitting: Obstetrics and Gynecology

## 2020-02-10 ENCOUNTER — Other Ambulatory Visit: Payer: Self-pay

## 2020-02-10 ENCOUNTER — Ambulatory Visit (INDEPENDENT_AMBULATORY_CARE_PROVIDER_SITE_OTHER): Payer: BC Managed Care – PPO | Admitting: Obstetrics and Gynecology

## 2020-02-10 VITALS — Ht 66.0 in | Wt 215.0 lb

## 2020-02-10 DIAGNOSIS — Z6834 Body mass index (BMI) 34.0-34.9, adult: Secondary | ICD-10-CM

## 2020-02-10 DIAGNOSIS — E669 Obesity, unspecified: Secondary | ICD-10-CM

## 2020-02-10 MED ORDER — PHENTERMINE HCL 37.5 MG PO TABS: 37.5000 mg | ORAL_TABLET | Freq: Every day | ORAL | 0 refills | Status: DC

## 2020-02-10 NOTE — Progress Notes (Signed)
I connected with Deborah House on 02/10/20 at  2:10 PM EDT by telephone and verified that I am speaking with the correct person using two identifiers.   I discussed the limitations, risks, security and privacy concerns of performing an evaluation and management service by telephone and the availability of in person appointments. I also discussed with the patient that there may be a patient responsible charge related to this service. The patient expressed understanding and agreed to proceed.  The patient was at home I spoke with the patient from my workstation phone The names of people involved in this encounter were: Deborah House , and Deborah House   Gynecology Office Visit  Chief Complaint:  Chief Complaint  Patient presents with  . Weight Check    History of Present Illness: Patientis a 29 y.o. G42P1011 female, who presents for the evaluation of the desire to lose weight. She has lost 6 pounds 1 months, 17lbs weight loss in 2 months. The patient states the following symptoms since starting her weight loss therapy: appetite suppression, energy, and weight loss.  The patient also reports no other ill effects. The patient specifically denies heart palpitations, anxiety, and insomnia.    Review of Systems: 10 point review of systems negative unless otherwise noted in HPI  Past Medical History:  Past Medical History:  Diagnosis Date  . Abdominal pain   . Amenorrhea   . Anemia   . Chlamydia 2012   tx'd  . Family history of ovarian cancer 07/2015   genetic testing letter sent  . Hemorrhoids   . Low-lying placenta in third trimester   . Missed abortion 06/01/2014   [facility]    Past Surgical History:  Past Surgical History:  Procedure Laterality Date  . CESAREAN SECTION N/A 06/27/2017   Procedure: CESAREAN SECTION;  Surgeon: Deborah Austria, MD;  Location: [facility] ORS;  Service: Obstetrics;  Laterality: N/A;  . CESAREAN SECTION N/A 07/09/2019   Procedure:  CESAREAN SECTION;  Surgeon: Nadara Mustard, MD;  Location: [facility] ORS;  Service: Obstetrics;  Laterality: N/A;  . DILATION AND CURETTAGE OF UTERUS  06/01/2014  . EYE SURGERY Left    when an infant    Gynecologic History: Patient's last menstrual period was 02/10/2020.  Obstetric History: G3P1011  Family History:  Family History  Problem Relation Age of Onset  . Diabetes Maternal Grandmother   . Ovarian cancer Maternal Grandmother 60  . Cancer Maternal Grandmother        Ovarian cancer  . Anemia Mother   . Anemia Sister     Social History:  Social History   Socioeconomic History  . Marital status: Married    Spouse name: Not on file  . Number of children: 0  . Years of education: 28  . Highest education level: Not on file  Occupational History  . Occupation: RETAIL SALES  Tobacco Use  . Smoking status: Never Smoker  . Smokeless tobacco: Never Used  Vaping Use  . Vaping Use: Never used  Substance and Sexual Activity  . Alcohol use: No  . Drug use: No  . Sexual activity: Yes    Birth control/protection: None  Other Topics Concern  . Not on file  Social History Narrative  . Not on file   Social Determinants of Health   Financial Resource Strain:   . Difficulty of Paying Living Expenses: Not on file  Food Insecurity:   . Worried About Programme researcher, broadcasting/film/video in the Last Year: Not  on file  . Ran Out of Food in the Last Year: Not on file  Transportation Needs:   . Lack of Transportation (Medical): Not on file  . Lack of Transportation (Non-Medical): Not on file  Physical Activity:   . Days of Exercise per Week: Not on file  . Minutes of Exercise per Session: Not on file  Stress:   . Feeling of Stress : Not on file  Social Connections:   . Frequency of Communication with Friends and Family: Not on file  . Frequency of Social Gatherings with Friends and Family: Not on file  . Attends Religious Services: Not on file  . Active Member of Clubs or Organizations: Not  on file  . Attends Banker Meetings: Not on file  . Marital Status: Not on file  Intimate Partner Violence:   . Fear of Current or Ex-Partner: Not on file  . Emotionally Abused: Not on file  . Physically Abused: Not on file  . Sexually Abused: Not on file    Allergies:  Allergies  Allergen Reactions  . Ibuprofen Swelling     Swelling of face Suspected--unsure if it is a TRUE allergy.    Medications: Prior to Admission medications   Medication Sig Start Date End Date Taking? Authorizing Provider  doxycycline (VIBRAMYCIN) 100 MG capsule Take 1 capsule (100 mg total) by mouth daily. 01/13/20 02/12/20 Yes Deborah Austria, MD  phentermine (ADIPEX-P) 37.5 MG tablet Take 1 tablet (37.5 mg total) by mouth daily before breakfast. 01/13/20  Yes Deborah Austria, MD    Physical Exam Height 5\' 6"  (1.676 m), weight 215 lb (97.5 kg), last menstrual period 02/10/2020. Wt Readings from Last 3 Encounters:  02/10/20 215 lb (97.5 kg)  02/01/20 221 lb 6.4 oz (100.4 kg)  01/13/20 221 lb (100.2 kg)  Body mass index is 34.7 kg/m.  No physical exam as this was a remote telephone visit to promote social distancing during the current COVID-19 Pandemic   Assessment: 29 y.o. 38 medical weight loss follow up  Plan: Problem List Items Addressed This Visit    None    Visit Diagnoses    Class 1 obesity without serious comorbidity with body mass index (BMI) of 34.0 to 34.9 in adult, unspecified obesity type    -  Primary   Relevant Medications   phentermine (ADIPEX-P) 37.5 MG tablet      1) 1500 Calorie ADA Diet  2) Patient education given regarding appropriate lifestyle changes for weight loss including: regular physical activity, healthy coping strategies, caloric restriction and healthy eating patterns.  3) Patient will be started on weight loss medication. The risks and benefits and side effects of medication, such as Adipex (Phenteramine) ,  Tenuate (Diethylproprion),  Belviq (lorcarsin), Contrave (buproprion/naltrexone), Qsymia (phentermine/topiramate), and Saxenda (liraglutide) is discussed. The pros and cons of suppressing appetite and boosting metabolism is discussed. Risks of tolerence and addiction is discussed for selected agents discussed. Use of medicine will ne short term, such as 3-4 months at a time followed by a period of time off of the medicine to avoid these risks and side effects for Adipex, Qsymia, and Tenuate discussed. Pt to call with any negative side effects and agrees to keep follow up appts.  4) Patient to take medication, with the benefits of appetite suppression and metabolism boost d/w pt, along with the side effects and risk factors of long term use that will be avoided with our use of short bursts of therapy. Rx provided.  5) Telephone Time 6:31min  6)  Return in about 4 weeks (around 03/09/2020) for medication follow up (phone).    Deborah Austria, MD, [facility], [facility] 02/10/2020, 2:01 PM

## 2020-02-10 NOTE — Telephone Encounter (Signed)
Pt has an appointment this afternoon

## 2020-03-09 ENCOUNTER — Encounter: Payer: Self-pay | Admitting: Obstetrics and Gynecology

## 2020-03-09 ENCOUNTER — Ambulatory Visit (INDEPENDENT_AMBULATORY_CARE_PROVIDER_SITE_OTHER): Payer: BC Managed Care – PPO | Admitting: Obstetrics and Gynecology

## 2020-03-09 ENCOUNTER — Other Ambulatory Visit: Payer: Self-pay

## 2020-03-09 VITALS — Wt 215.0 lb

## 2020-03-09 DIAGNOSIS — E669 Obesity, unspecified: Secondary | ICD-10-CM | POA: Diagnosis not present

## 2020-03-09 DIAGNOSIS — Z6834 Body mass index (BMI) 34.0-34.9, adult: Secondary | ICD-10-CM

## 2020-03-09 DIAGNOSIS — B373 Candidiasis of vulva and vagina: Secondary | ICD-10-CM | POA: Diagnosis not present

## 2020-03-09 DIAGNOSIS — B3731 Acute candidiasis of vulva and vagina: Secondary | ICD-10-CM

## 2020-03-09 MED ORDER — FLUCONAZOLE 150 MG PO TABS
150.0000 mg | ORAL_TABLET | Freq: Once | ORAL | 0 refills | Status: AC
Start: 2020-03-09 — End: 2020-03-09

## 2020-03-09 NOTE — Progress Notes (Signed)
Gynecology Office Visit  Chief Complaint:  Chief Complaint  Patient presents with  . Weight Check    History of Present Illness: Patientis a 29 y.o. G36P1011 female, who presents for the evaluation of the desire to lose weight. She has lost 0 pounds 4 months. The patient states the following symptoms since starting her weight loss therapy: appetite suppression, energy, and weight loss.  The patient also reports no other ill effects. The patient specifically denies heart palpitations, anxiety, and insomnia.    Does report vaginal irritation with itching and discharge that started recently, no antibiotic exposures.  No changes soaps detergents.     Review of Systems: 10 point review of systems negative unless otherwise noted in HPI  Past Medical History:  Past Medical History:  Diagnosis Date  . Abdominal pain   . Amenorrhea   . Anemia   . Chlamydia 2012   tx'd  . Family history of ovarian cancer 07/2015   genetic testing letter sent  . Hemorrhoids   . Low-lying placenta in third trimester   . Missed abortion 06/01/2014   [facility]    Past Surgical History:  Past Surgical History:  Procedure Laterality Date  . CESAREAN SECTION N/A 06/27/2017   Procedure: CESAREAN SECTION;  Surgeon: [name], MD;  Location: [facility] ORS;  Service: Obstetrics;  Laterality: N/A;  . CESAREAN SECTION N/A 07/09/2019   Procedure: CESAREAN SECTION;  Surgeon: Nadara Mustard, MD;  Location: [facility] ORS;  Service: Obstetrics;  Laterality: N/A;  . DILATION AND CURETTAGE OF UTERUS  06/01/2014  . EYE SURGERY Left    when an infant    Gynecologic History: Patient's last menstrual period was 02/10/2020.  Obstetric History: G3P1011  Family History:  Family History  Problem Relation Age of Onset  . Diabetes Maternal Grandmother   . Ovarian cancer Maternal Grandmother 60  . Cancer Maternal Grandmother        Ovarian cancer  . Anemia Mother   . Anemia Sister     Social History:  Social  History   Socioeconomic History  . Marital status: Married    Spouse name: Not on file  . Number of children: 0  . Years of education: 15  . Highest education level: Not on file  Occupational History  . Occupation: RETAIL SALES  Tobacco Use  . Smoking status: Never Smoker  . Smokeless tobacco: Never Used  Vaping Use  . Vaping Use: Never used  Substance and Sexual Activity  . Alcohol use: No  . Drug use: No  . Sexual activity: Yes    Birth control/protection: None  Other Topics Concern  . Not on file  Social History Narrative  . Not on file   Social Determinants of Health   Financial Resource Strain:   . Difficulty of Paying Living Expenses: Not on file  Food Insecurity:   . Worried About Programme researcher, broadcasting/film/video in the Last Year: Not on file  . Ran Out of Food in the Last Year: Not on file  Transportation Needs:   . Lack of Transportation (Medical): Not on file  . Lack of Transportation (Non-Medical): Not on file  Physical Activity:   . Days of Exercise per Week: Not on file  . Minutes of Exercise per Session: Not on file  Stress:   . Feeling of Stress : Not on file  Social Connections:   . Frequency of Communication with Friends and Family: Not on file  . Frequency of Social Gatherings  with Friends and Family: Not on file  . Attends Religious Services: Not on file  . Active Member of Clubs or Organizations: Not on file  . Attends Banker Meetings: Not on file  . Marital Status: Not on file  Intimate Partner Violence:   . Fear of Current or Ex-Partner: Not on file  . Emotionally Abused: Not on file  . Physically Abused: Not on file  . Sexually Abused: Not on file    Allergies:  Allergies  Allergen Reactions  . Ibuprofen Swelling     Swelling of face Suspected--unsure if it is a TRUE allergy.    Medications: Prior to Admission medications   Medication Sig Start Date End Date Taking? Authorizing Provider  phentermine (ADIPEX-P) 37.5 MG tablet Take  1 tablet (37.5 mg total) by mouth daily before breakfast. 02/10/20  Yes [name], MD    Physical Exam Weight 215 lb (97.5 kg), last menstrual period 02/10/2020. Wt Readings from Last 3 Encounters:  03/09/20 215 lb (97.5 kg)  02/10/20 215 lb (97.5 kg)  02/01/20 221 lb 6.4 oz (100.4 kg)  Body mass index is 34.7 kg/m.   No physical exam as this was a remote telephone visit to promote social distancing during the current COVID-19 Pandemic   Assessment: 29 y.o. [id number] No problem-specific Assessment & Plan notes found for this encounter.   Plan: Problem List Items Addressed This Visit    None    Visit Diagnoses    Vaginal candida    -  Primary   Relevant Medications   fluconazole (DIFLUCAN) 150 MG tablet   Class 1 obesity without serious comorbidity with body mass index (BMI) of 34.0 to 34.9 in adult, unspecified obesity type          1) 1500 Calorie ADA Diet  2) Patient education given regarding appropriate lifestyle changes for weight loss including: regular physical activity, healthy coping strategies, caloric restriction and healthy eating patterns.  3) No weight loss on month 4 of phentermine, discontinue and restart in 2-3 months.  If weight gain of >7lbs to represent.  Discussed longterm alternative options including Contrave and Saxenda  4) Vaginal candida symptoms - empiric treatment with diflucan   5) Telephone Time 8:33min  6)  Return in about 3 months (around 06/09/2020) for medical weightloss follow up.    [name], MD, [facility], [facility] 03/09/2020, 10:14 AM

## 2020-06-02 ENCOUNTER — Other Ambulatory Visit: Payer: Self-pay

## 2020-06-02 ENCOUNTER — Inpatient Hospital Stay: Payer: BC Managed Care – PPO | Attending: Oncology

## 2020-06-02 DIAGNOSIS — D509 Iron deficiency anemia, unspecified: Secondary | ICD-10-CM | POA: Diagnosis not present

## 2020-06-02 DIAGNOSIS — Z833 Family history of diabetes mellitus: Secondary | ICD-10-CM | POA: Diagnosis not present

## 2020-06-02 DIAGNOSIS — Z8041 Family history of malignant neoplasm of ovary: Secondary | ICD-10-CM | POA: Diagnosis not present

## 2020-06-02 LAB — CBC WITH DIFFERENTIAL/PLATELET
Abs Immature Granulocytes: 0.02 10*3/uL (ref 0.00–0.07)
Basophils Absolute: 0 10*3/uL (ref 0.0–0.1)
Basophils Relative: 0 %
Eosinophils Absolute: 0.1 10*3/uL (ref 0.0–0.5)
Eosinophils Relative: 1 %
HCT: 40.7 % (ref 36.0–46.0)
Hemoglobin: 13.3 g/dL (ref 12.0–15.0)
Immature Granulocytes: 0 %
Lymphocytes Relative: 42 %
Lymphs Abs: 4.2 10*3/uL — ABNORMAL HIGH (ref 0.7–4.0)
MCH: 26.2 pg (ref 26.0–34.0)
MCHC: 32.7 g/dL (ref 30.0–36.0)
MCV: 80.3 fL (ref 80.0–100.0)
Monocytes Absolute: 0.5 10*3/uL (ref 0.1–1.0)
Monocytes Relative: 5 %
Neutro Abs: 5.1 10*3/uL (ref 1.7–7.7)
Neutrophils Relative %: 52 %
Platelets: 262 10*3/uL (ref 150–400)
RBC: 5.07 MIL/uL (ref 3.87–5.11)
RDW: 12.8 % (ref 11.5–15.5)
WBC: 10 10*3/uL (ref 4.0–10.5)
nRBC: 0 % (ref 0.0–0.2)

## 2020-06-02 LAB — IRON AND TIBC
Iron: 44 ug/dL (ref 28–170)
Saturation Ratios: 14 % (ref 10.4–31.8)
TIBC: 325 ug/dL (ref 250–450)
UIBC: 281 ug/dL

## 2020-06-02 LAB — FERRITIN: Ferritin: 92 ng/mL (ref 11–307)

## 2020-06-04 NOTE — Progress Notes (Signed)
[facility]  [phone] Fax:([phone]) [phone]  ID: Deborah House OB: August 01, 1990  MR#: [id number]  YIF#:[id number]  Patient Care Team: Maryella Shivers as PCP - General (Physician Assistant) Jeralyn Ruths, MD as Consulting Physician (Oncology)  I connected with Deborah House on 06/07/20 at  3:00 PM EST by video enabled telemedicine visit and verified that I am speaking with the correct person using two identifiers.   I discussed the limitations, risks, security and privacy concerns of performing an evaluation and management service by telemedicine and the availability of in-person appointments. I also discussed with the patient that there may be a patient responsible charge related to this service. The patient expressed understanding and agreed to proceed.   Other persons participating in the visit and their role in the encounter: Patient, MD.  Patient's location: Home. Provider's location: Car.  CHIEF COMPLAINT: Iron deficiency anemia.  INTERVAL HISTORY: Patient agreed to video assisted telemedicine visit for further evaluation and discussion of her laboratory results.  She continues to complain of fatigue, but attributes this to working 3rd shift and taking care of her 36 and 8-year-old daughters (Deborah House and Deborah House).  She otherwise feels well. She has no neurologic complaints.  She denies any recent fevers or illnesses.  She has a good appetite and denies weight loss.  She has no chest pain, shortness of breath, cough, or hemoptysis.  She denies any nausea, vomiting, constipation, or diarrhea.  She has no melena or hematochezia.  She has no urinary complaints.  Patient offers no further specific complaints today.  REVIEW OF SYSTEMS:   Review of Systems  Constitutional: Positive for malaise/fatigue. Negative for fever and weight loss.  Respiratory: Negative.  Negative for cough and shortness of breath.   Cardiovascular: Negative.   Negative for chest pain and leg swelling.  Gastrointestinal: Negative.  Negative for abdominal pain, blood in stool and melena.  Genitourinary: Negative.  Negative for hematuria.  Musculoskeletal: Negative.  Negative for back pain.  Skin: Negative.  Negative for rash.  Neurological: Negative.  Negative for dizziness, focal weakness, weakness and headaches.  Psychiatric/Behavioral: Negative.  The patient is not nervous/anxious.     As per HPI. Otherwise, a complete review of systems is negative.  PAST MEDICAL HISTORY: Past Medical History:  Diagnosis Date  . Abdominal pain   . Amenorrhea   . Anemia   . Chlamydia 2012   tx'd  . Family history of ovarian cancer 07/2015   genetic testing letter sent  . Hemorrhoids   . Low-lying placenta in third trimester   . Missed abortion 06/01/2014   [facility]    PAST SURGICAL HISTORY: Past Surgical History:  Procedure Laterality Date  . CESAREAN SECTION N/A 06/27/2017   Procedure: CESAREAN SECTION;  Surgeon: [name], MD;  Location: [facility] ORS;  Service: Obstetrics;  Laterality: N/A;  . CESAREAN SECTION N/A 07/09/2019   Procedure: CESAREAN SECTION;  Surgeon: Nadara Mustard, MD;  Location: [facility] ORS;  Service: Obstetrics;  Laterality: N/A;  . DILATION AND CURETTAGE OF UTERUS  06/01/2014  . EYE SURGERY Left    when an infant    FAMILY HISTORY: Family History  Problem Relation Age of Onset  . Diabetes Maternal Grandmother   . Ovarian cancer Maternal Grandmother 60  . Cancer Maternal Grandmother        Ovarian cancer  . Anemia Mother   . Anemia Sister     ADVANCED DIRECTIVES (Y/N):  N  HEALTH MAINTENANCE: Social History  Tobacco Use  . Smoking status: Never Smoker  . Smokeless tobacco: Never Used  Vaping Use  . Vaping Use: Never used  Substance Use Topics  . Alcohol use: No  . Drug use: No     Colonoscopy:  PAP:  Bone density:  Lipid panel:  Allergies  Allergen Reactions  . Ibuprofen Swelling     Swelling of face  Suspected--unsure if it is a TRUE allergy.    No current outpatient medications on file.   No current facility-administered medications for this visit.    OBJECTIVE: There were no vitals filed for this visit.   There is no height or weight on file to calculate BMI.    ECOG FS:0 - Asymptomatic  General: Well-developed, well-nourished, no acute distress. HEENT: Normocephalic. Neuro: Alert, answering all questions appropriately. Cranial nerves grossly intact. Psych: Normal affect.   LAB RESULTS:  Lab Results  Component Value Date   NA 141 09/23/2019   K 4.0 09/23/2019   CL 106 09/23/2019   CO2 21 09/23/2019   GLUCOSE 102 (H) 09/23/2019   BUN 9 09/23/2019   CREATININE 0.86 09/23/2019   CALCIUM 9.1 09/23/2019   PROT 7.3 09/23/2019   ALBUMIN 4.3 09/23/2019   AST 37 09/23/2019   ALT 53 (H) 09/23/2019   ALKPHOS 89 09/23/2019   BILITOT 0.3 09/23/2019   GFRNONAA 90 09/23/2019   GFRAA 104 09/23/2019    Lab Results  Component Value Date   WBC 10.0 06/02/2020   NEUTROABS 5.1 06/02/2020   HGB 13.3 06/02/2020   HCT 40.7 06/02/2020   MCV 80.3 06/02/2020   PLT 262 06/02/2020   Lab Results  Component Value Date   IRON 44 06/02/2020   TIBC 325 06/02/2020   IRONPCTSAT 14 06/02/2020    Lab Results  Component Value Date   FERRITIN 92 06/02/2020     STUDIES: No results found.  ASSESSMENT: Iron deficiency anemia.  PLAN:  1.  Iron deficiency anemia: Patient cannot tolerate oral iron supplementation.  Patient's hemoglobin and iron stores continue to be within normal limits.  She does not require additional IV Feraheme today. Patient last received treatment on Oct 16, 2019.  Return to clinic in 4 months with repeat laboratory work and video assisted telemedicine visit.  If her laboratory results continue to remain within normal limits, she could possibly be discharged to clinic.    2.  Decreased MCV: Resolved.  Hemoglobinopathy profile was previously reported as normal.  I  provided 20 minutes of face-to-face video visit time during this encounter which included chart review, counseling, and coordination of care as documented above.    Patient expressed understanding and was in agreement with this plan. She also understands that She can call clinic at any time with any questions, concerns, or complaints.    Jeralyn Ruths, MD   06/07/2020 3:18 PM

## 2020-06-07 ENCOUNTER — Inpatient Hospital Stay (HOSPITAL_BASED_OUTPATIENT_CLINIC_OR_DEPARTMENT_OTHER): Payer: BC Managed Care – PPO | Admitting: Oncology

## 2020-06-07 DIAGNOSIS — D509 Iron deficiency anemia, unspecified: Secondary | ICD-10-CM | POA: Diagnosis not present

## 2020-06-07 NOTE — Progress Notes (Signed)
Patient state she is still having extreme fatigue.

## 2020-06-10 ENCOUNTER — Other Ambulatory Visit: Payer: Self-pay

## 2020-06-10 ENCOUNTER — Encounter: Payer: Self-pay | Admitting: Obstetrics and Gynecology

## 2020-06-10 ENCOUNTER — Ambulatory Visit (INDEPENDENT_AMBULATORY_CARE_PROVIDER_SITE_OTHER): Payer: BC Managed Care – PPO | Admitting: Obstetrics and Gynecology

## 2020-06-10 VITALS — BP 118/72 | Ht 66.0 in | Wt 219.0 lb

## 2020-06-10 DIAGNOSIS — E669 Obesity, unspecified: Secondary | ICD-10-CM

## 2020-06-10 DIAGNOSIS — Z23 Encounter for immunization: Secondary | ICD-10-CM

## 2020-06-10 DIAGNOSIS — Z6835 Body mass index (BMI) 35.0-35.9, adult: Secondary | ICD-10-CM | POA: Diagnosis not present

## 2020-06-10 MED ORDER — PHENTERMINE HCL 37.5 MG PO TABS: 37.5000 mg | ORAL_TABLET | Freq: Every day | ORAL | 0 refills | Status: DC

## 2020-06-10 NOTE — Progress Notes (Signed)
Gynecology Office Visit  Chief Complaint:  Chief Complaint  Patient presents with  . Follow-up    Weight loss, want to discuss PCOS - RM 4     History of Present Illness: Patientis a 30 y.o. G52P1011 female, who presents for the evaluation of weight gain. She has gained 4 pounds primarily over 3 months. The patient states the following issues have contributed to her weight problem: pandemic lifestyle changes.  The patient has no additional symptoms. The patient specifically denies memory loss, muscle weakness, excessive thirst, and polyuria. Weight related co-morbidities include none. The patient's past medical history is notable for noen. She has tried phentermine interventions in the past with modest success.   Review of Systems: 10 point review of systems negative unless otherwise noted in HPI  Past Medical History:  Patient Active Problem List   Diagnosis Date Noted  . History of cesarean delivery 07/09/2019  . BMI 33.0-33.9,adult 05/07/2019  . Rh negative state in antepartum period 12/09/2018  . History of cesarean section complicating pregnancy 12/09/2018  . Supervision of other normal pregnancy, antepartum 11/10/2018    Cl[facility] Prenatal Labs  Dating 7 week [place] Blood type: O/Negative/-- (06/17 1536)   Genetic Screen    NIPS:Normal XX Antibody:Negative (06/17 1536)  Anatomic [place] WSOB Rubella: 2.06 (06/17 1536) Varicella: Immune  GTT nml RPR: Non Reactive (06/17 1536)   Rhogam [x]  28 weeks 11/18 HBsAg: Negative (06/17 1536)   Vaccines TDAP:12/17                       Flu Shot: 11/18 HIV: Non Reactive (06/17 1536)   Baby Food                     Breast           GBS:   Contraception  Uncertain Pap: 11/07/2016 NIL  CBB  No   CS/VBAC [x] VBAC if labors, CS 41 weeks if not   Support Person Jeanmychal        . Pregnancy with history of cesarean section, antepartum 11/10/2018  . Obesity affecting pregnancy, antepartum 11/10/2018  . Anemia affecting pregnancy 05/15/2017     Hgb 8.0 at 28 weeks [X]  hematology referral     Past Surgical History:  Past Surgical History:  Procedure Laterality Date  . CESAREAN SECTION N/A 06/27/2017   Procedure: CESAREAN SECTION;  Surgeon: 05/17/2017, MD;  Location: [facility] ORS;  Service: Obstetrics;  Laterality: N/A;  . CESAREAN SECTION N/A 07/09/2019   Procedure: CESAREAN SECTION;  Surgeon: 08/25/2017, MD;  Location: [facility] ORS;  Service: Obstetrics;  Laterality: N/A;  . DILATION AND CURETTAGE OF UTERUS  06/01/2014  . EYE SURGERY Left    when an infant    Gynecologic History: Patient's last menstrual period was 06/05/2020.  Obstetric History: G3P1011  Family History:  Family History  Problem Relation Age of Onset  . Diabetes Maternal Grandmother   . Ovarian cancer Maternal Grandmother 60  . Cancer Maternal Grandmother        Ovarian cancer  . Anemia Mother   . Anemia Sister     Social History:  Social History   Socioeconomic History  . Marital status: Married    Spouse name: Not on file  . Number of children: 0  . Years of education: 2  . Highest education level: Not on file  Occupational History  . Occupation: RETAIL SALES  Tobacco Use  . Smoking status: Never Smoker  .  Smokeless tobacco: Never Used  Vaping Use  . Vaping Use: Never used  Substance and Sexual Activity  . Alcohol use: No  . Drug use: No  . Sexual activity: Yes    Birth control/protection: None  Other Topics Concern  . Not on file  Social History Narrative  . Not on file   Social Determinants of Health   Financial Resource Strain: Not on file  Food Insecurity: Not on file  Transportation Needs: Not on file  Physical Activity: Not on file  Stress: Not on file  Social Connections: Not on file  Intimate Partner Violence: Not on file    Allergies:  Allergies  Allergen Reactions  . Ibuprofen Swelling     Swelling of face Suspected--unsure if it is a TRUE allergy.    Medications: Prior to Admission medications    Not on File    Physical Exam Blood pressure 118/72, height 5\' 6"  (1.676 m), weight 219 lb (99.3 kg), last menstrual period 06/05/2020. Patient's last menstrual period was 06/05/2020.  Body mass index is 35.35 kg/m.   General: NAD HEENT: normocephalic, anicteric Thyroid: no enlargement Pulmonary: no increased work of breathing Neurologic: Grossly intact Psychiatric: mood appropriate, affect full  Assessment: 30 y.o. G3P1011 presenting for discussion of weight loss management options  Plan: Problem List Items Addressed This Visit   None   Visit Diagnoses    Need for immunization against influenza    -  Primary   Relevant Orders   Flu Vaccine QUAD 36+ mos IM (Completed)   Class 2 obesity without serious comorbidity with body mass index (BMI) of 35.0 to 35.9 in adult, unspecified obesity type       Relevant Medications   phentermine (ADIPEX-P) 37.5 MG tablet      1) 1500 Calorie ADA Diet  2) Patient education given regarding appropriate lifestyle changes for weight loss including: regular physical activity, healthy coping strategies, caloric restriction and healthy eating patterns.  3) Patient will be started on weight loss medication. The risks and benefits and side effects of medication, such as Adipex (Phenteramine) ,  Tenuate (Diethylproprion), Belviq (lorcarsin), Contrave (buproprion/naltrexone), Qsymia (phentermine/topiramate), and Saxenda (liraglutide) is discussed. The pros and cons of suppressing appetite and boosting metabolism is discussed. Risks of tolerence and addiction is discussed for selected agents discussed. Use of medicine will ne short term, such as 3-4 months at a time followed by a period of time off of the medicine to avoid these risks and side effects for Adipex, Qsymia, and Tenuate discussed. Pt to call with any negative side effects and agrees to keep follow up appts.  4) Comorbidity Screening - hypothyroidism screening, diabetes, and hyperlipidemia  screening offered  5) Encouraged weekly weight monitorig to track progress and sample 1 week food diary  6) 15 minutes face-to-face; counseling/coordination of care > 50 percent of visit  7) Return in about 4 weeks (around 07/08/2020) for medication follow up phone.   07/10/2020, MD, [name] [facility], [facility] 06/10/2020, 2:25 PM

## 2020-06-13 ENCOUNTER — Telehealth: Payer: Self-pay

## 2020-06-13 NOTE — Telephone Encounter (Signed)
Pt calling; per ins needs PA for quantity; please respond to pt thru MyChart on status of this.  [phone]

## 2020-07-06 ENCOUNTER — Ambulatory Visit (INDEPENDENT_AMBULATORY_CARE_PROVIDER_SITE_OTHER): Payer: BC Managed Care – PPO | Admitting: Obstetrics and Gynecology

## 2020-07-06 ENCOUNTER — Encounter: Payer: Self-pay | Admitting: Obstetrics and Gynecology

## 2020-07-06 ENCOUNTER — Other Ambulatory Visit: Payer: Self-pay

## 2020-07-06 VITALS — Ht 66.0 in | Wt 212.1 lb

## 2020-07-06 DIAGNOSIS — Z6834 Body mass index (BMI) 34.0-34.9, adult: Secondary | ICD-10-CM | POA: Diagnosis not present

## 2020-07-06 DIAGNOSIS — E669 Obesity, unspecified: Secondary | ICD-10-CM | POA: Diagnosis not present

## 2020-07-06 MED ORDER — PHENTERMINE HCL 37.5 MG PO TABS: 37.5000 mg | ORAL_TABLET | Freq: Every day | ORAL | 0 refills | Status: DC

## 2020-07-06 NOTE — Progress Notes (Signed)
I connected with Deborah House on 07/06/20 at  1:30 PM EST by telephone and verified that I am speaking with the correct person using two identifiers.   I discussed the limitations, risks, security and privacy concerns of performing an evaluation and management service by telephone and the availability of in person appointments. I also discussed with the patient that there may be a patient responsible charge related to this service. The patient expressed understanding and agreed to proceed.  The patient was at home I spoke with the patient from my workstation phone The names of people involved in this encounter were: Tequita Rosemarie Ax , and [name]   Gynecology Office Visit  Chief Complaint:  Chief Complaint  Patient presents with  . Follow-up    Phone visit F/U weight loss    History of Present Illness: Patientis a 30 y.o. G59P1011 female, who presents for the evaluation of the desire to lose weight. She has lost 7 pounds 1 months. The patient states the following symptoms since starting her weight loss therapy: appetite suppression, energy, and weight loss.  The patient also reports no other ill effects. The patient specifically denies heart palpitations, anxiety, and insomnia.    Review of Systems: 10 point review of systems negative unless otherwise noted in HPI  Past Medical History:  Past Medical History:  Diagnosis Date  . Abdominal pain   . Amenorrhea   . Anemia   . Chlamydia 2012   tx'd  . Family history of ovarian cancer 07/2015   genetic testing letter sent  . Hemorrhoids   . Low-lying placenta in third trimester   . Missed abortion 06/01/2014   [facility]    Past Surgical History:  Past Surgical History:  Procedure Laterality Date  . CESAREAN SECTION N/A 06/27/2017   Procedure: CESAREAN SECTION;  Surgeon: [name], MD;  Location: [facility] ORS;  Service: Obstetrics;  Laterality: N/A;  . CESAREAN SECTION N/A 07/09/2019   Procedure: CESAREAN  SECTION;  Surgeon: Nadara Mustard, MD;  Location: [facility] ORS;  Service: Obstetrics;  Laterality: N/A;  . DILATION AND CURETTAGE OF UTERUS  06/01/2014  . EYE SURGERY Left    when an infant    Gynecologic History: No LMP recorded.  Obstetric History: G3P1011  Family History:  Family History  Problem Relation Age of Onset  . Diabetes Maternal Grandmother   . Ovarian cancer Maternal Grandmother 60  . Cancer Maternal Grandmother        Ovarian cancer  . Anemia Mother   . Anemia Sister     Social History:  Social History   Socioeconomic History  . Marital status: Married    Spouse name: Not on file  . Number of children: 0  . Years of education: 74  . Highest education level: Not on file  Occupational History  . Occupation: RETAIL SALES  Tobacco Use  . Smoking status: Never Smoker  . Smokeless tobacco: Never Used  Vaping Use  . Vaping Use: Never used  Substance and Sexual Activity  . Alcohol use: No  . Drug use: No  . Sexual activity: Yes    Birth control/protection: None  Other Topics Concern  . Not on file  Social History Narrative  . Not on file   Social Determinants of Health   Financial Resource Strain: Not on file  Food Insecurity: Not on file  Transportation Needs: Not on file  Physical Activity: Not on file  Stress: Not on file  Social Connections: Not  on file  Intimate Partner Violence: Not on file    Allergies:  Allergies  Allergen Reactions  . Ibuprofen Swelling     Swelling of face Suspected--unsure if it is a TRUE allergy.    Medications: Prior to Admission medications   Medication Sig Start Date End Date Taking? Authorizing Provider  phentermine (ADIPEX-P) 37.5 MG tablet Take 1 tablet (37.5 mg total) by mouth daily before breakfast. 06/10/20  Yes [name], MD    Physical Exam Height 5\' 6"  (1.676 m), weight 212 lb 2 oz (96.2 kg). Wt Readings from Last 3 Encounters:  07/06/20 212 lb 2 oz (96.2 kg)  06/10/20 219 lb (99.3 kg)   03/09/20 215 lb (97.5 kg)  Body mass index is 34.24 kg/m.  No physical exam as this was a remote telephone visit to promote social distancing during the current COVID-19 Pandemic  Assessment: 30 y.o. G3P1011 follow up medical weight loss  Plan: Problem List Items Addressed This Visit   None   Visit Diagnoses    Class 1 obesity without serious comorbidity with body mass index (BMI) of 34.0 to 34.9 in adult, unspecified obesity type    -  Primary   Relevant Medications   phentermine (ADIPEX-P) 37.5 MG tablet      1) 1500 Calorie ADA Diet  2) Patient education given regarding appropriate lifestyle changes for weight loss including: regular physical activity, healthy coping strategies, caloric restriction and healthy eating patterns.  3) Patient will be started on weight loss medication. The risks and benefits and side effects of medication, such as Adipex (Phenteramine) ,  Tenuate (Diethylproprion), Contrave (buproprion/naltrexone), Qsymia (phentermine/topiramate), and Saxenda (liraglutide) is discussed. The pros and cons of suppressing appetite and boosting metabolism is discussed. Risks of tolerence and addiction is discussed for selected agents discussed. Use of medicine will ne short term, such as 3-4 months at a time followed by a period of time off of the medicine to avoid these risks and side effects for Adipex, Qsymia, and Tenuate discussed. Pt to call with any negative side effects and agrees to keep follow up appts. - rx phentermine   4) Telephone 5:00 min  5)  Return in about 4 weeks (around 08/03/2020) for medication follow.    08/05/2020, MD, [name] [facility], [facility] 07/06/2020, 1:44 PM

## 2020-07-14 DIAGNOSIS — Z118 Encounter for screening for other infectious and parasitic diseases: Secondary | ICD-10-CM | POA: Diagnosis not present

## 2020-08-02 ENCOUNTER — Ambulatory Visit (INDEPENDENT_AMBULATORY_CARE_PROVIDER_SITE_OTHER): Payer: BC Managed Care – PPO | Admitting: Obstetrics and Gynecology

## 2020-08-02 ENCOUNTER — Encounter: Payer: Self-pay | Admitting: Obstetrics and Gynecology

## 2020-08-02 ENCOUNTER — Other Ambulatory Visit: Payer: Self-pay

## 2020-08-02 VITALS — Ht 66.0 in | Wt 206.3 lb

## 2020-08-02 DIAGNOSIS — Z79899 Other long term (current) drug therapy: Secondary | ICD-10-CM

## 2020-08-02 DIAGNOSIS — E669 Obesity, unspecified: Secondary | ICD-10-CM | POA: Diagnosis not present

## 2020-08-02 DIAGNOSIS — Z6833 Body mass index (BMI) 33.0-33.9, adult: Secondary | ICD-10-CM | POA: Diagnosis not present

## 2020-08-02 MED ORDER — PHENTERMINE HCL 37.5 MG PO TABS: 37.5000 mg | ORAL_TABLET | Freq: Every day | ORAL | 0 refills | Status: DC

## 2020-08-02 NOTE — Progress Notes (Signed)
I connected with Deborah House on 08/02/20 at  8:30 AM EDT by telephone and verified that I am speaking with the correct person using two identifiers.   I discussed the limitations, risks, security and privacy concerns of performing an evaluation and management service by telephone and the availability of in person appointments. I also discussed with the patient that there may be a patient responsible charge related to this service. The patient expressed understanding and agreed to proceed.  The patient was at home I spoke with the patient from my workstation phone The names of people involved in this encounter were: Deborah House , and Deborah House   Gynecology Office Visit  Chief Complaint:  Chief Complaint  Patient presents with  . Follow-up    Phone visit F/U medication, no concerns.     History of Present Illness: Patientis a 30 y.o. G30P1011 female, who presents for the evaluation of the desire to lose weight. She has lost 6 pounds 1 months. The patient states the following symptoms since starting her weight loss therapy: appetite suppression, energy, and weight loss.  The patient also reports no other ill effects. The patient specifically denies heart palpitations, anxiety, and insomnia.    Review of Systems: 10 point review of systems negative unless otherwise noted in HPI  Past Medical History:  Past Medical History:  Diagnosis Date  . Abdominal pain   . Amenorrhea   . Anemia   . Chlamydia 2012   tx'd  . Family history of ovarian cancer 07/2015   genetic testing letter sent  . Hemorrhoids   . Low-lying placenta in third trimester   . Missed abortion 06/01/2014   [facility]    Past Surgical History:  Past Surgical History:  Procedure Laterality Date  . CESAREAN SECTION N/A 06/27/2017   Procedure: CESAREAN SECTION;  Surgeon: Deborah Austria, MD;  Location: [facility] ORS;  Service: Obstetrics;  Laterality: N/A;  . CESAREAN SECTION N/A 07/09/2019    Procedure: CESAREAN SECTION;  Surgeon: Nadara Mustard, MD;  Location: [facility] ORS;  Service: Obstetrics;  Laterality: N/A;  . DILATION AND CURETTAGE OF UTERUS  06/01/2014  . EYE SURGERY Left    when an infant    Gynecologic History: No LMP recorded.  Obstetric History: G3P1011  Family History:  Family History  Problem Relation Age of Onset  . Diabetes Maternal Grandmother   . Ovarian cancer Maternal Grandmother 60  . Cancer Maternal Grandmother        Ovarian cancer  . Anemia Mother   . Anemia Sister     Social History:  Social History   Socioeconomic History  . Marital status: Married    Spouse name: Not on file  . Number of children: 0  . Years of education: 93  . Highest education level: Not on file  Occupational History  . Occupation: RETAIL SALES  Tobacco Use  . Smoking status: Never Smoker  . Smokeless tobacco: Never Used  Vaping Use  . Vaping Use: Never used  Substance and Sexual Activity  . Alcohol use: No  . Drug use: No  . Sexual activity: Yes    Birth control/protection: None  Other Topics Concern  . Not on file  Social History Narrative  . Not on file   Social Determinants of Health   Financial Resource Strain: Not on file  Food Insecurity: Not on file  Transportation Needs: Not on file  Physical Activity: Not on file  Stress: Not on file  Social  Connections: Not on file  Intimate Partner Violence: Not on file    Allergies:  Allergies  Allergen Reactions  . Ibuprofen Swelling     Swelling of face Suspected--unsure if it is a TRUE allergy.    Medications: Prior to Admission medications   Medication Sig Start Date End Date Taking? Authorizing Provider  phentermine (ADIPEX-P) 37.5 MG tablet Take 1 tablet (37.5 mg total) by mouth daily before breakfast. 07/06/20   Deborah Austria, MD  tobramycin (TOBREX) 0.3 % ophthalmic solution SMARTSIG:In Eye(s) 08/01/20   [provider]    Physical Exam Height 5\' 6"  (1.676 m), weight 206 lb  5 oz (93.6 kg). Wt Readings from Last 3 Encounters:  08/02/20 206 lb 5 oz (93.6 kg)  07/06/20 212 lb 2 oz (96.2 kg)  06/10/20 219 lb (99.3 kg)  Body mass index is 33.3 kg/m.  No physical exam as this was a remote telephone visit to promote social distancing during the current COVID-19 Pandemic  Assessment: 30 y.o. G3P1011 medication follow weight loss  Plan: Problem List Items Addressed This Visit   None   Visit Diagnoses    Class 1 obesity with body mass index (BMI) of 33.0 to 33.9 in adult, unspecified obesity type, unspecified whether serious comorbidity present    -  Primary   Relevant Medications   phentermine (ADIPEX-P) 37.5 MG tablet      1) 1500 Calorie ADA Diet  2) Patient education given regarding appropriate lifestyle changes for weight loss including: regular physical activity, healthy coping strategies, caloric restriction and healthy eating patterns.  3) Patient will be started on weight loss medication. The risks and benefits and side effects of medication, such as Adipex (Phenteramine) ,  Tenuate (Diethylproprion), Contrave (buproprion/naltrexone), Qsymia (phentermine/topiramate), and Saxenda (liraglutide) is discussed. The pros and cons of suppressing appetite and boosting metabolism is discussed. Risks of tolerence and addiction is discussed for selected agents discussed. Use of medicine will ne short term, such as 3-4 months at a time followed by a period of time off of the medicine to avoid these risks and side effects for Adipex, Qsymia, and Tenuate discussed. Pt to call with any negative side effects and agrees to keep follow up appts.  4) Patient to take medication, with the benefits of appetite suppression and metabolism boost d/w pt, along with the side effects and risk factors of long term use that will be avoided with our use of short bursts of therapy. Rx provided.    5) 15 minutes face-to-face; with counseling/coordination of care > 50 percent of visit  related to obesity and ongoing management/treatment   6)  Return in about 4 weeks (around 08/30/2020) for medication follow.    10/30/2020, MD, [facility], [facility] 08/02/2020, 8:37 AM

## 2020-08-05 ENCOUNTER — Other Ambulatory Visit: Payer: Self-pay

## 2020-08-05 ENCOUNTER — Encounter: Payer: Self-pay | Admitting: Obstetrics

## 2020-08-05 ENCOUNTER — Ambulatory Visit (INDEPENDENT_AMBULATORY_CARE_PROVIDER_SITE_OTHER): Payer: BC Managed Care – PPO | Admitting: Obstetrics

## 2020-08-05 ENCOUNTER — Other Ambulatory Visit (HOSPITAL_COMMUNITY)
Admission: RE | Admit: 2020-08-05 | Discharge: 2020-08-05 | Disposition: A | Discharge status: Home / Self Care | Payer: BC Managed Care – PPO | Source: Ambulatory Visit | Attending: Obstetrics | Admitting: Obstetrics

## 2020-08-05 VITALS — BP 110/74 | HR 76 | Ht 66.0 in | Wt 207.0 lb

## 2020-08-05 DIAGNOSIS — Z809 Family history of malignant neoplasm, unspecified: Secondary | ICD-10-CM

## 2020-08-05 DIAGNOSIS — Z124 Encounter for screening for malignant neoplasm of cervix: Secondary | ICD-10-CM | POA: Insufficient documentation

## 2020-08-05 DIAGNOSIS — Z9189 Other specified personal risk factors, not elsewhere classified: Secondary | ICD-10-CM

## 2020-08-05 DIAGNOSIS — Z01419 Encounter for gynecological examination (general) (routine) without abnormal findings: Secondary | ICD-10-CM

## 2020-08-05 DIAGNOSIS — Z8041 Family history of malignant neoplasm of ovary: Secondary | ICD-10-CM | POA: Diagnosis not present

## 2020-08-05 NOTE — Progress Notes (Signed)
Gynecology Annual Exam   PCP: Trinna Post, PA-C  Chief Complaint: No chief complaint on file.   History of Present Illness: Patient is a 30 y.o. G3P1011 presents for annual exam. The patient has no complaints today.   LMP: Patient's last menstrual period was 07/10/2020 (exact date). Average Interval: irregular, 28 days when she is regular. Her periods fluctuate based on her wieight. Duration of flow: 5 days Heavy Menses: no Clots: no Intermenstrual Bleeding: no Postcoital Bleeding: no Dysmenorrhea: no  The patient is sexually active. She currently uses none for contraception. She denies dyspareunia.  The patient occasionally does perform self breast exams.  There is notable family history of breast or ovarian cancer in her family.She has been approved for the Myriad testing in the past, but has not followed through. Today she would like to have the Myriad test drawn. The patient wears seatbelts: yes.   The patient has regular exercise: no.    The patient reports current symptoms of depression.  She describes these as very mild, and has been seeking a Social worker. She denies a need for medication; denies any SIs.  Review of Systems: Review of Systems  Constitutional: Negative.   HENT: Negative.   Eyes: Negative.   Respiratory: Negative.   Cardiovascular: Negative.   Gastrointestinal: Negative.   Genitourinary: Negative.   Skin: Negative.   Neurological: Negative.   Endo/Heme/Allergies: Negative.   Psychiatric/Behavioral: Positive for depression.       Edinburgh score 9   Edinburgh score:  Past Medical History:  Patient Active Problem List   Diagnosis Date Noted  . Anemia affecting pregnancy 05/15/2017    Hgb 8.0 at 28 weeks '[X]'  hematology referral     Past Surgical History:  Past Surgical History:  Procedure Laterality Date  . CESAREAN SECTION N/A 06/27/2017   Procedure: CESAREAN SECTION;  Surgeon: Malachy Mood, MD;  Location: [facility] ORS;  Service:  Obstetrics;  Laterality: N/A;  . CESAREAN SECTION N/A 07/09/2019   Procedure: CESAREAN SECTION;  Surgeon: Gae Dry, MD;  Location: [facility] ORS;  Service: Obstetrics;  Laterality: N/A;  . DILATION AND CURETTAGE OF UTERUS  06/01/2014  . EYE SURGERY Left    when an infant  . MOUTH SURGERY Left 2017   root canal    Gynecologic History:  Patient's last menstrual period was 07/10/2020 (exact date). Contraception: none Last Pap: Results were: no abnormalities   Obstetric History: G3P1011  Family History:  Family History  Problem Relation Age of Onset  . Diabetes Maternal Grandmother   . Ovarian cancer Maternal Grandmother 60  . Cancer Maternal Grandmother        Ovarian cancer  . Anemia Mother   . Anemia Sister     Social History:  Social History   Socioeconomic History  . Marital status: Married    Spouse name: Not on file  . Number of children: 0  . Years of education: 25  . Highest education level: Not on file  Occupational History  . Occupation: RETAIL SALES  Tobacco Use  . Smoking status: Never Smoker  . Smokeless tobacco: Never Used  Vaping Use  . Vaping Use: Never used  Substance and Sexual Activity  . Alcohol use: No  . Drug use: No  . Sexual activity: Yes    Birth control/protection: None  Other Topics Concern  . Not on file  Social History Narrative  . Not on file   Social Determinants of Health   Financial Resource Strain: Not  on file  Food Insecurity: Not on file  Transportation Needs: Not on file  Physical Activity: Not on file  Stress: Not on file  Social Connections: Not on file  Intimate Partner Violence: Not on file    Allergies:  No Known Allergies  Medications: Prior to Admission medications   Medication Sig Start Date End Date Taking? Authorizing Provider  phentermine (ADIPEX-P) 37.5 MG tablet Take 1 tablet (37.5 mg total) by mouth daily before breakfast. 08/02/20  Yes Malachy Mood, MD  tobramycin (TOBREX) 0.3 % ophthalmic  solution SMARTSIG:In Eye(s) 08/01/20  Yes [provider]    Physical Exam Vitals: Blood pressure 110/74, pulse 76, height '5\' 6"'  (1.676 m), weight 207 lb (93.9 kg), last menstrual period 07/10/2020, not currently breastfeeding.  General: NAD, high BMI HEENT: normocephalic, anicteric Thyroid: no enlargement, no palpable nodules Pulmonary: No increased work of breathing, CTAB Cardiovascular: RRR, distal pulses 2+ Breast: Breast symmetrical,pendulous,  no tenderness, no palpable nodules or masses, no skin or nipple retraction present, no nipple discharge.  No axillary or supraclavicular lymphadenopathy. Abdomen: NABS, soft, non-tender, non-distended.  Umbilicus without lesions.  No hepatomegaly, splenomegaly or masses palpable. No evidence of hernia  Genitourinary:  External: Normal external female genitalia.  Normal urethral meatus, normal Bartholin's and Skene's glands.    Vagina: Normal vaginal mucosa, no evidence of prolapse.    Cervix: Grossly normal in appearance, no bleeding  Uterus: Non-enlarged, mobile, normal contour.  No CMT  Adnexa: ovaries non-enlarged, no adnexal masses  Rectal: deferred  Lymphatic: no evidence of inguinal lymphadenopathy Extremities: no edema, erythema, or tenderness Neurologic: Grossly intact Psychiatric: mood appropriate, affect full Edinburgh score:9  Female chaperone present for pelvic and breast  portions of the physical exam    Assessment: 30 y.o. G3P1011 routine annual exam Qualifies for Myriad testing ( Grandmother had ovarian cancer)  Plan: Problem List Items Addressed This Visit   None   Visit Diagnoses    Women's annual routine gynecological examination    -  Primary   Relevant Orders   Cytology - PAP   Integrated BRACAnalysis ([facility])   Cervical cancer screening       Relevant Orders   Cytology - PAP   Increased risk for hereditary cancer syndrome       Relevant Orders   Integrated BRACAnalysis ([place])      2) STI screening  wasoffered and declined  2)  ASCCP guidelines and rational discussed.  Patient opts for every 3 years screening interval  3) Contraception - the patient is currently using  none.  She is declines contraceptive management today. She would welcome another baby. She is reminded that she is presently on PHentermine for weight loss and this would be discouraged  should she seek a pregnancy.  4) Routine healthcare maintenance including cholesterol, diabetes screening discussed managed by PCP  5) Return in about 1 year (around 08/05/2021) for annual.   Myriad testing draw today.   Imagene Riches, CNM  08/05/2020 10:49 AM   [facility], [facility] 08/05/2020, 10:32 AM

## 2020-08-08 LAB — CYTOLOGY - PAP
Adequacy: ABSENT
Comment: NEGATIVE
Diagnosis: NEGATIVE
High risk HPV: NEGATIVE

## 2020-08-19 DIAGNOSIS — Z1589 Genetic susceptibility to other disease: Secondary | ICD-10-CM

## 2020-08-19 DIAGNOSIS — Z1379 Encounter for other screening for genetic and chromosomal anomalies: Secondary | ICD-10-CM

## 2020-08-19 DIAGNOSIS — Z1501 Genetic susceptibility to malignant neoplasm of breast: Secondary | ICD-10-CM

## 2020-08-19 DIAGNOSIS — Z1509 Genetic susceptibility to other malignant neoplasm: Secondary | ICD-10-CM

## 2020-08-19 HISTORY — DX: Encounter for other screening for genetic and chromosomal anomalies: Z13.79

## 2020-08-19 HISTORY — DX: Genetic susceptibility to malignant neoplasm of breast: Z15.01

## 2020-08-19 HISTORY — DX: Genetic susceptibility to other malignant neoplasm: Z15.09

## 2020-08-19 HISTORY — DX: Genetic susceptibility to other disease: Z15.89

## 2020-08-30 ENCOUNTER — Ambulatory Visit (INDEPENDENT_AMBULATORY_CARE_PROVIDER_SITE_OTHER): Payer: BC Managed Care – PPO | Admitting: Obstetrics and Gynecology

## 2020-08-30 VITALS — Ht 66.0 in | Wt 203.0 lb

## 2020-08-30 DIAGNOSIS — N944 Primary dysmenorrhea: Secondary | ICD-10-CM | POA: Diagnosis not present

## 2020-08-30 DIAGNOSIS — Z6832 Body mass index (BMI) 32.0-32.9, adult: Secondary | ICD-10-CM | POA: Diagnosis not present

## 2020-08-30 DIAGNOSIS — E669 Obesity, unspecified: Secondary | ICD-10-CM | POA: Diagnosis not present

## 2020-08-30 NOTE — Progress Notes (Signed)
I connected with Deborah House on 08/30/20 at  1:30 PM EDT by telephone and verified that I am speaking with the correct person using two identifiers.   I discussed the limitations, risks, security and privacy concerns of performing an evaluation and management service by telephone and the availability of in person appointments. I also discussed with the patient that there may be a patient responsible charge related to this service. The patient expressed understanding and agreed to proceed.  The patient was at home I spoke with the patient from my workstation phone The names of people involved in this encounter were: Deborah House , and Deborah House '  Gynecology Office Visit  Chief Complaint:  Chief Complaint  Patient presents with  . Follow-up    Phone visit - F/U medication, back pain and lower abdomen, spotting after intercourse     History of Present Illness: Patientis a 30 y.o. G32P1011 female, who presents for the evaluation of the desire to lose weight. She has lost 4 pounds 1 months, and 15lbs in the last 3 months. The patient states the following symptoms since starting her weight loss therapy: appetite suppression, energy, and weight loss.  The patient also reports no other ill effects. The patient specifically denies heart palpitations, anxiety, and insomnia.   Has noted worsening cramping and back pain day preceding onset of menstruation.  No change is length or amount of menstrual flow.   Review of Systems: 10 point review of systems negative unless otherwise noted in HPI  Past Medical History:  Past Medical History:  Diagnosis Date  . Abdominal pain   . Amenorrhea   . Anemia   . Anemia affecting pregnancy 05/15/2017   Hgb 8.0 at 28 weeks [X]  hematology referral  . Chlamydia 2012   tx'd  . Family history of ovarian cancer 07/2015   genetic testing letter sent  . Hemorrhoids   . Low-lying placenta in third trimester   . Missed abortion  06/01/2014   [facility]    Past Surgical History:  Past Surgical History:  Procedure Laterality Date  . CESAREAN SECTION N/A 06/27/2017   Procedure: CESAREAN SECTION;  Surgeon: 08/25/2017, MD;  Location: [facility] ORS;  Service: Obstetrics;  Laterality: N/A;  . CESAREAN SECTION N/A 07/09/2019   Procedure: CESAREAN SECTION;  Surgeon: 07/11/2019, MD;  Location: [facility] ORS;  Service: Obstetrics;  Laterality: N/A;  . DILATION AND CURETTAGE OF UTERUS  06/01/2014  . EYE SURGERY Left    when an infant  . MOUTH SURGERY Left 2017   root canal    Gynecologic History: No LMP recorded.  Obstetric History: G3P1011  Family History:  Family History  Problem Relation Age of Onset  . Diabetes Maternal Grandmother   . Ovarian cancer Maternal Grandmother 60  . Anemia Mother   . Anemia Sister     Social History:  Social History   Socioeconomic History  . Marital status: Married    Spouse name: Not on file  . Number of children: 0  . Years of education: 67  . Highest education level: Not on file  Occupational History  . Occupation: RETAIL SALES  Tobacco Use  . Smoking status: Never Smoker  . Smokeless tobacco: Never Used  Vaping Use  . Vaping Use: Never used  Substance and Sexual Activity  . Alcohol use: No  . Drug use: No  . Sexual activity: Yes    Birth control/protection: None  Other Topics Concern  . Not on  file  Social History Narrative  . Not on file   Social Determinants of Health   Financial Resource Strain: Not on file  Food Insecurity: Not on file  Transportation Needs: Not on file  Physical Activity: Not on file  Stress: Not on file  Social Connections: Not on file  Intimate Partner Violence: Not on file    Allergies:  No Known Allergies  Medications: Prior to Admission medications   Medication Sig Start Date End Date Taking? Authorizing Provider  phentermine (ADIPEX-P) 37.5 MG tablet Take 1 tablet (37.5 mg total) by mouth daily before breakfast. 08/02/20    Deborah Austria, MD  tobramycin (TOBREX) 0.3 % ophthalmic solution SMARTSIG:In Eye(s) 08/01/20   [provider]    Physical Exam There were no vitals taken for this visit. Wt Readings from Last 3 Encounters:  08/30/20 203 lb (92.1 kg)  08/05/20 207 lb (93.9 kg)  08/02/20 206 lb 5 oz (93.6 kg)  Body mass index is 32.77 kg/m.  No physical exam as this was a remote telephone visit to promote social distancing during the current COVID-19 Pandemic  Assessment: 30 y.o. [id number] medical weight loss follow up  Plan: Problem List Items Addressed This Visit   None   Visit Diagnoses    Class 1 obesity without serious comorbidity with body mass index (BMI) of 32.0 to 32.9 in adult, unspecified obesity type    -  Primary   Primary dysmenorrhea          1) 1500 Calorie ADA Diet  2) Patient education given regarding appropriate lifestyle changes for weight loss including: regular physical activity, healthy coping strategies, caloric restriction and healthy eating patterns.  3) Patient to take medication, with the benefits of appetite suppression and metabolism boost d/w pt, along with the side effects and risk factors of long term use that will be avoided with our use of short bursts of therapy. Has one more month of medication, will reach out if see weight go back up.   4) Telephone time: 8:76min  5) Primary dysmenorrhea - start with trial of ibuprofen prn  6) Return in about 11 years (around 08/31/2031) for annual exam .    Deborah Austria, MD, [facility], [facility] 08/30/2020, 1:53 PM

## 2020-09-05 ENCOUNTER — Encounter: Payer: Self-pay | Admitting: Obstetrics and Gynecology

## 2020-09-12 ENCOUNTER — Ambulatory Visit: Payer: BC Managed Care – PPO | Admitting: Obstetrics

## 2020-09-12 ENCOUNTER — Encounter: Payer: Self-pay | Admitting: Obstetrics

## 2020-09-12 ENCOUNTER — Other Ambulatory Visit: Payer: Self-pay

## 2020-09-13 ENCOUNTER — Ambulatory Visit (INDEPENDENT_AMBULATORY_CARE_PROVIDER_SITE_OTHER): Payer: BC Managed Care – PPO | Admitting: Obstetrics

## 2020-09-13 ENCOUNTER — Encounter: Payer: Self-pay | Admitting: Obstetrics

## 2020-09-13 VITALS — BP 116/72 | Ht 66.0 in | Wt 205.0 lb

## 2020-09-13 DIAGNOSIS — Z1501 Genetic susceptibility to malignant neoplasm of breast: Secondary | ICD-10-CM

## 2020-09-13 DIAGNOSIS — Z1509 Genetic susceptibility to other malignant neoplasm: Secondary | ICD-10-CM

## 2020-09-13 NOTE — Progress Notes (Signed)
Obstetrics & Gynecology Office Visit   Chief Complaint:  Chief Complaint  Patient presents with  . Results    History of Present Illness: Deborah House presents to discuss the results of her Myriad testing. She has tested positive for the PALBr2  for increased breast Cancer.      Review of Systems:  Review of Systems  Constitutional: Negative.   HENT: Negative.   Eyes: Negative.   Respiratory: Negative.   Cardiovascular: Negative.   Gastrointestinal: Negative.   Genitourinary: Negative.   Musculoskeletal: Negative.   Skin: Negative.   Neurological: Negative.   Endo/Heme/Allergies: Negative.   Psychiatric/Behavioral: Negative.      Past Medical History:  Past Medical History:  Diagnosis Date  . Abdominal pain   . Amenorrhea   . Anemia   . Anemia affecting pregnancy 05/15/2017   Hgb 8.0 at 28 weeks _0  hematology referral  . Chlamydia 2012   tx'd  . Family history of ovarian cancer 07/2015   genetic testing letter sent; 4/22 PALB2 positive  . Genetic testing of female 08/2020   MyRisk PALB2 positive, with 2 MSH3, POLE, and RNF43 VUS  . Hemorrhoids   . Low-lying placenta in third trimester   . Missed abortion 06/01/2014   [facility]  . Monoallelic mutation of PALB2 gene 08/2020   MyRisk; increased risk of breast/ovar/pancreatic cancer    Past Surgical History:  Past Surgical History:  Procedure Laterality Date  . CESAREAN SECTION N/A 06/27/2017   Procedure: CESAREAN SECTION;  Surgeon: Malachy Mood, MD;  Location: [facility] ORS;  Service: Obstetrics;  Laterality: N/A;  . CESAREAN SECTION N/A 07/09/2019   Procedure: CESAREAN SECTION;  Surgeon: Gae Dry, MD;  Location: [facility] ORS;  Service: Obstetrics;  Laterality: N/A;  . DILATION AND CURETTAGE OF UTERUS  06/01/2014  . EYE SURGERY Left    when an infant  . MOUTH SURGERY Left 2017   root canal    Gynecologic History: Patient's last menstrual period was 09/07/2020.  Obstetric History: G3P1011  Family History:   Family History  Problem Relation Age of Onset  . Diabetes Maternal Grandmother   . Ovarian cancer Maternal Grandmother 60  . Anemia Mother   . Anemia Sister     Social History:  Social History   Socioeconomic History  . Marital status: Married    Spouse name: Not on file  . Number of children: 0  . Years of education: 21  . Highest education level: Not on file  Occupational History  . Occupation: RETAIL SALES  Tobacco Use  . Smoking status: Never Smoker  . Smokeless tobacco: Never Used  Vaping Use  . Vaping Use: Never used  Substance and Sexual Activity  . Alcohol use: No  . Drug use: No  . Sexual activity: Yes    Birth control/protection: None  Other Topics Concern  . Not on file  Social History Narrative  . Not on file   Social Determinants of Health   Financial Resource Strain: Not on file  Food Insecurity: Not on file  Transportation Needs: Not on file  Physical Activity: Not on file  Stress: Not on file  Social Connections: Not on file  Intimate Partner Violence: Not on file    Allergies:  No Known Allergies  Medications: Prior to Admission medications   Medication Sig Start Date End Date Taking? Authorizing Provider  phentermine (ADIPEX-P) 37.5 MG tablet Take 1 tablet (37.5 mg total) by mouth daily before breakfast. 08/02/20  Yes Malachy Mood, MD  Physical Exam Vitals:  Vitals:   09/13/20 1619  BP: 116/72   Patient's last menstrual period was 09/07/2020.  Physical Exam Constitutional:      Appearance: Normal appearance. She is obese.  HENT:     Head: Normocephalic and atraumatic.  Cardiovascular:     Rate and Rhythm: Normal rate and regular rhythm.  Musculoskeletal:        General: Normal range of motion.     Cervical back: Normal range of motion and neck supple.  Skin:    General: Skin is warm and dry.  Neurological:     General: No focal deficit present.     Mental Status: She is alert and oriented to person, place, and time.   Psychiatric:        Mood and Affect: Mood normal.        Behavior: Behavior normal.      Assessment: 30 y.o. G3P1011  - Positive PALBR2 result on the Myriad test.   Plan: Problem List Items Addressed This Visit   None   Visit Diagnoses    BRCA2 positive    -  Primary     A total of 25 minutes was spent with the patient reviewing her Myriad testing; arranging for clinical follow up, and providing her education on the genetics results. She will begin annual mammograms, have clinical breast exams every 6-12 months, and consider referral to oncology to discuss any decision regarding mastectomy. She will reread the report and contact our office with any questions.  Imagene Riches, CNM  09/13/2020 6:02 PM

## 2020-09-14 DIAGNOSIS — Z8041 Family history of malignant neoplasm of ovary: Secondary | ICD-10-CM | POA: Diagnosis not present

## 2020-09-14 DIAGNOSIS — Z800 Family history of malignant neoplasm of digestive organs: Secondary | ICD-10-CM | POA: Diagnosis not present

## 2020-09-14 DIAGNOSIS — E663 Overweight: Secondary | ICD-10-CM | POA: Diagnosis not present

## 2020-09-14 DIAGNOSIS — R5383 Other fatigue: Secondary | ICD-10-CM | POA: Diagnosis not present

## 2020-10-03 ENCOUNTER — Inpatient Hospital Stay: Payer: BC Managed Care – PPO

## 2020-10-04 ENCOUNTER — Inpatient Hospital Stay: Payer: BC Managed Care – PPO | Admitting: Oncology

## 2020-12-08 DIAGNOSIS — Z1231 Encounter for screening mammogram for malignant neoplasm of breast: Secondary | ICD-10-CM | POA: Diagnosis not present

## 2021-01-19 IMAGING — CT CT HEAD W/O CM
3 series · 16 of 46 positions shown, 19 images · non-contrast
Comparison: None.

CLINICAL DATA: Fall with head injury.  Initial encounter.

EXAM:
CT HEAD WITHOUT CONTRAST
TECHNIQUE: Contiguous axial images were obtained from the base of the skull
through the vertex without intravenous contrast.

[Series 3: head wo · axial · 0.41mm/px · z∈[-177,-57]mm · 10 of 29 slices shown, 13 images]
[im 3/29  brain]
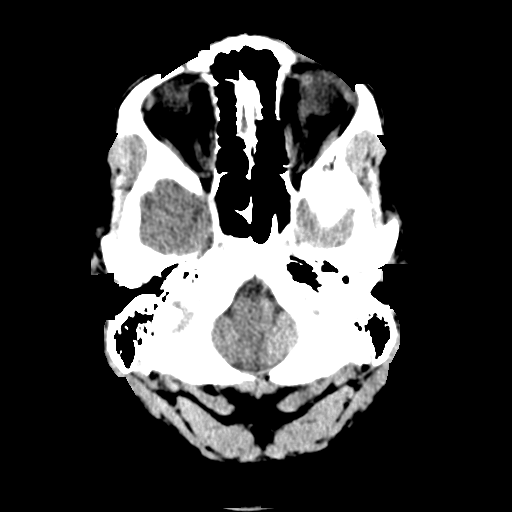
[im 3/29  bone]
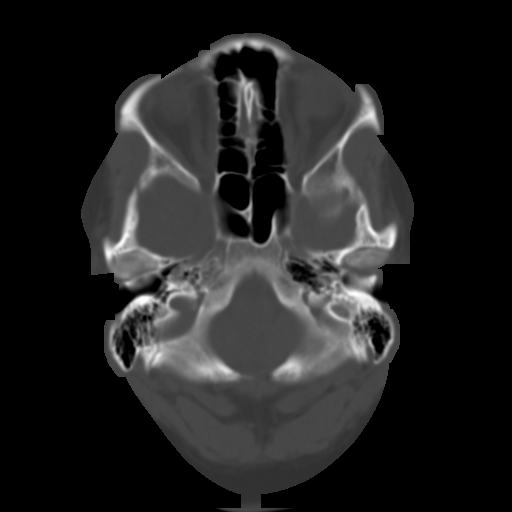
[im 6/29  brain]
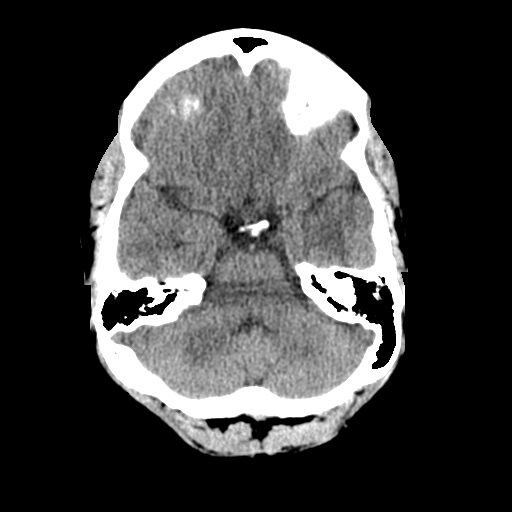
[im 8/29  brain]
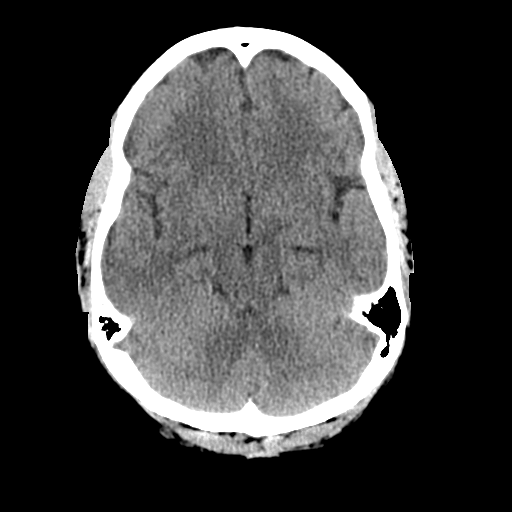
[im 11/29  brain]
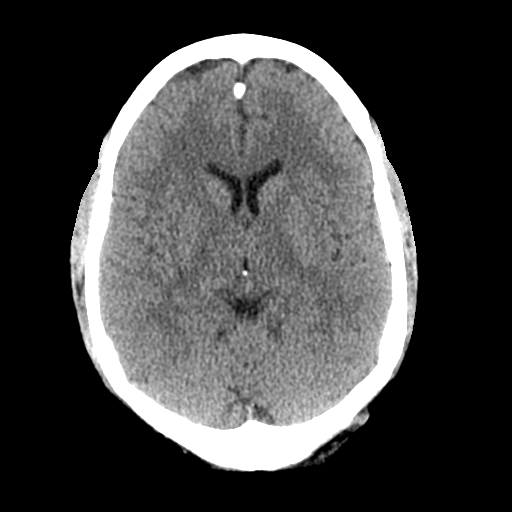
[im 14/29  brain]
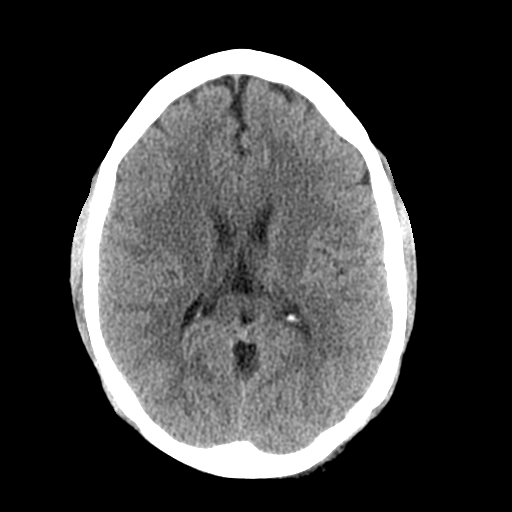
[im 14/29  bone]
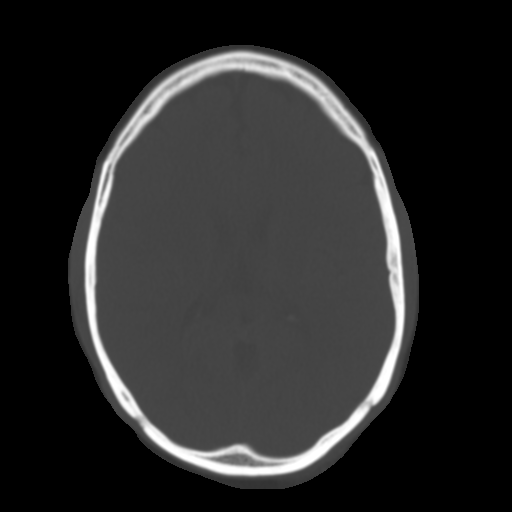
[im 16/29  brain]
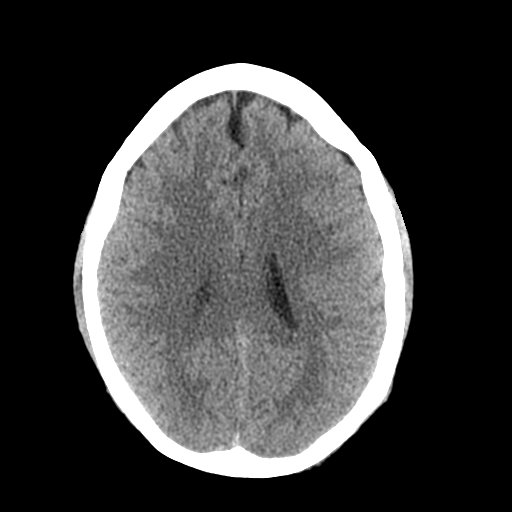
[im 19/29  brain]
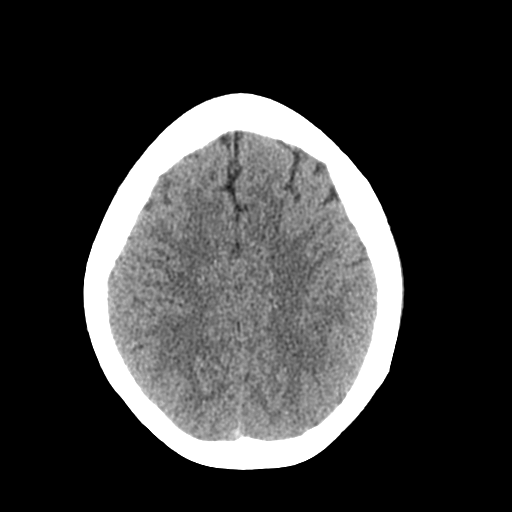
[im 22/29  brain]
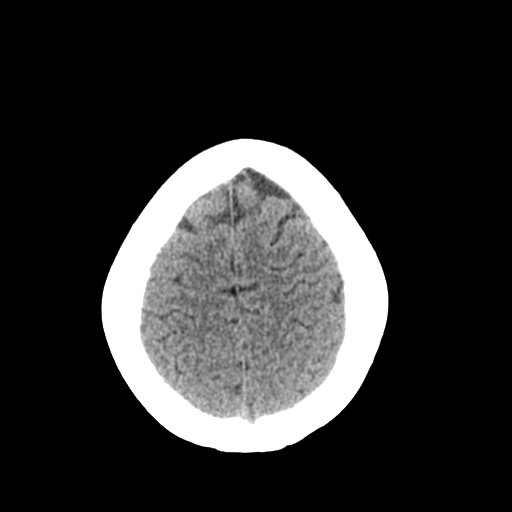
[im 24/29  brain]
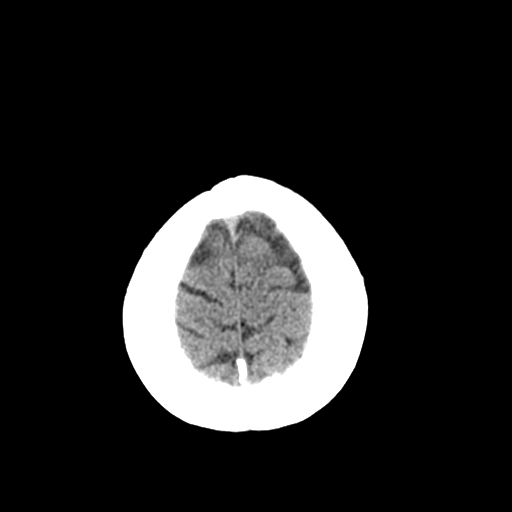
[im 24/29  bone]
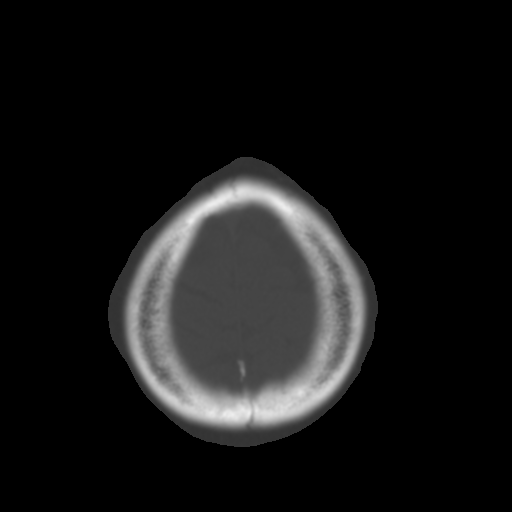
[im 27/29  brain]
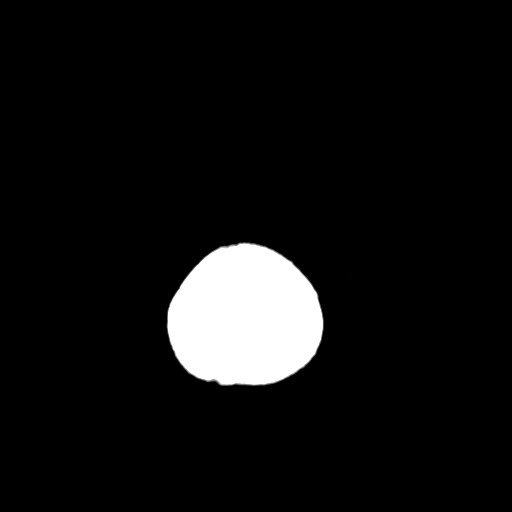

[Series 4: coronal soft tissue · coronal · 0.29mm/px · 3 of 61 slices shown]
[im 21/61  brain]
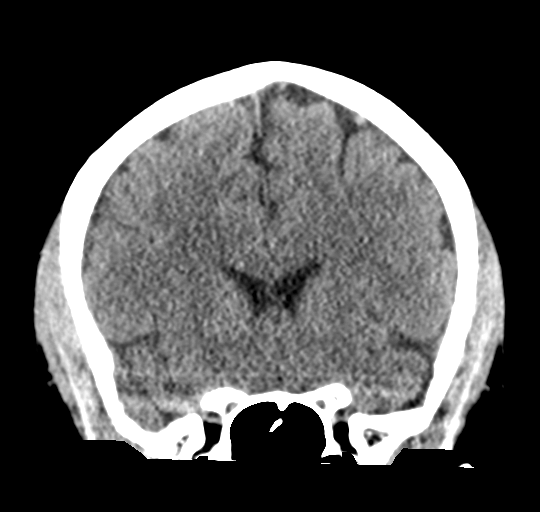
[im 27/61  brain]
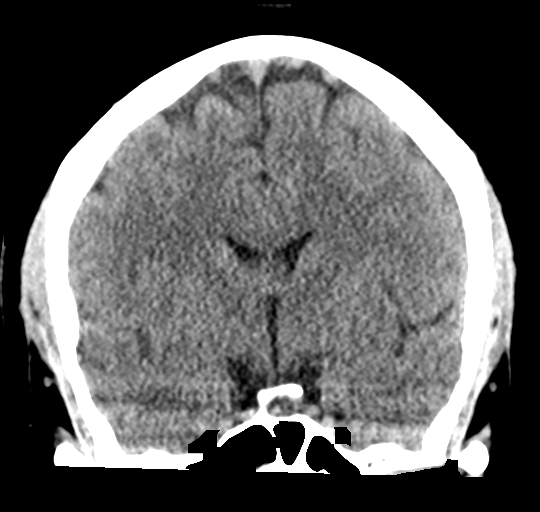
[im 34/61  brain]
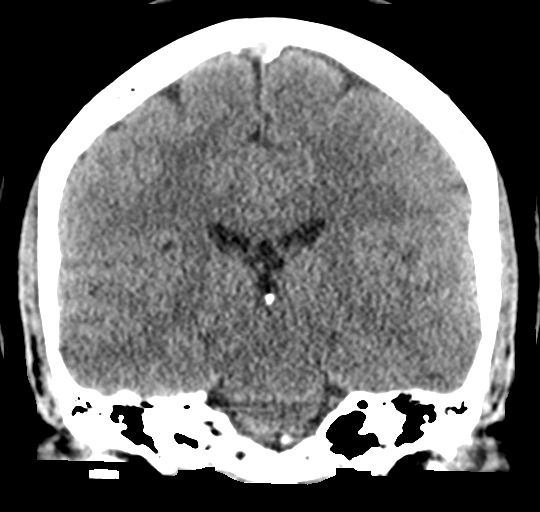

[Series 5: sagittal soft tissue · sagittal · 0.29mm/px · 3 of 49 slices shown]
[im 17/49  brain]
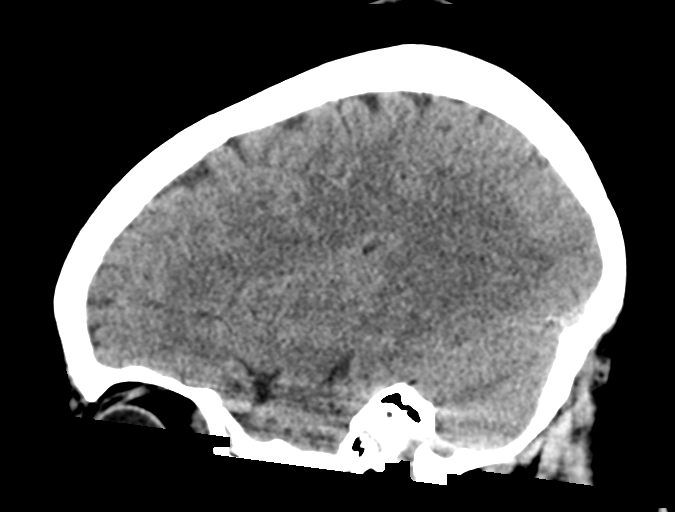
[im 25/49  brain]
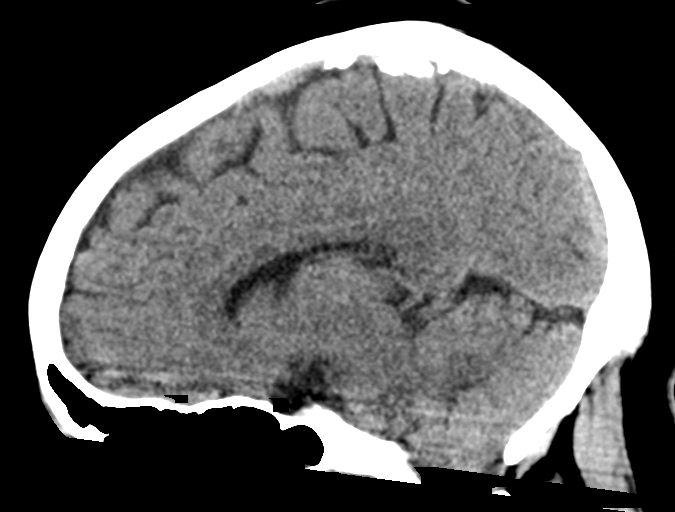
[im 33/49  brain]
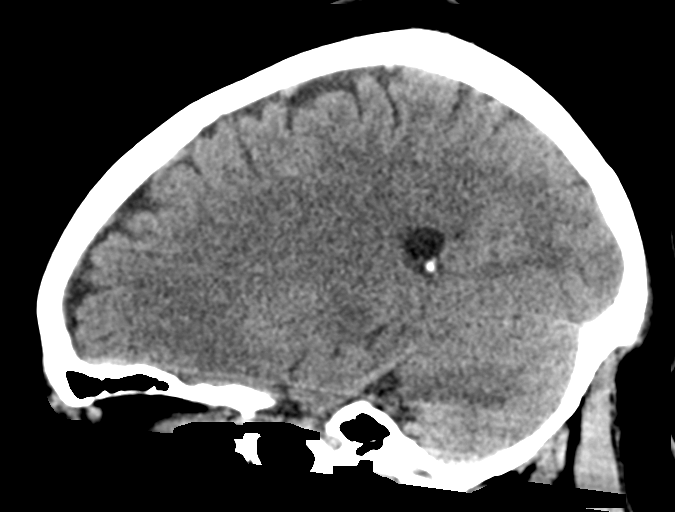

[16 of 46 positions shown; findings below may reference images not displayed]

FINDINGS: Brain: No evidence of infarction, hemorrhage, hydrocephalus,
extra-axial collection or mass lesion/mass effect.

Vascular: Negative

Skull: Left posterior scalp contusion without calvarial fracture

Sinuses/Orbits: Negative for injury
IMPRESSION: 1. No evidence of intracranial injury.
2. Scalp contusion without calvarial fracture.

## 2021-05-21 HISTORY — DX: Maternal care for unspecified type scar from previous cesarean delivery: O34.219

## 2021-10-02 ENCOUNTER — Encounter: Payer: Self-pay | Admitting: Oncology

## 2021-10-04 ENCOUNTER — Ambulatory Visit (INDEPENDENT_AMBULATORY_CARE_PROVIDER_SITE_OTHER): Payer: BC Managed Care – PPO

## 2021-10-04 DIAGNOSIS — O0990 Supervision of high risk pregnancy, unspecified, unspecified trimester: Secondary | ICD-10-CM | POA: Insufficient documentation

## 2021-10-04 DIAGNOSIS — Z3689 Encounter for other specified antenatal screening: Secondary | ICD-10-CM

## 2021-10-04 DIAGNOSIS — Z3480 Encounter for supervision of other normal pregnancy, unspecified trimester: Secondary | ICD-10-CM | POA: Insufficient documentation

## 2021-10-04 NOTE — Initial Assessments (Signed)
New OB Intake ? ?I connected with  Deborah House on 10/04/21 at  8:15 by telephone Telephone Visit and verified that I am speaking with the correct person using two identifiers. Nurse is located at [facility] and pt is located at home. ? ?I explained I am completing New OB Intake today. We discussed her EDD of 04/23/2022 that is based on LMP of 07/17/2021. Pt is G4/P2010. I reviewed her allergies, medications, Medical/Surgical/OB history, and appropriate screenings. I informed her of [facility] services. Based on history, this is a/an  pregnancy uncomplicated .  ? ?Patient Active Problem List  ? Diagnosis Date Noted  ? Anemia affecting pregnancy 05/15/2017  ? ? ?Concerns addressed today ? ?Delivery Plans:  ?Plans to deliver at [facility] L[facility].  ? ? [place] ?Explained first scheduled [place] will be scheduled at her first visit with provider. ? ?Labs ?Pt aware NOB labs will be drawn at first visit with provider. ? ?Patient has covid vaccine.   ? ?Social Determinants of Health ?Food Insecurity: Patient denies food insecurity.  ?Transportation: Patient denies transportation needs. ? ?Placed OB Box on problem list and updated ? ?First visit review ?I reviewed new OB appt with pt. I explained she will have a pelvic exam, ob bloodwork with genetic screening, and PAP smear. Explained pt will be seen by Paula Compton, CNM at first visit; encounter routed to appropriate provider.   ? ?Loran Senters, [facility]) ?10/04/2021  9:15 AM  ?

## 2021-10-05 ENCOUNTER — Telehealth: Payer: Self-pay

## 2021-10-05 ENCOUNTER — Other Ambulatory Visit: Payer: Self-pay | Admitting: Advanced Practice Midwife

## 2021-10-05 ENCOUNTER — Encounter: Payer: Self-pay | Admitting: Advanced Practice Midwife

## 2021-10-05 DIAGNOSIS — O219 Vomiting of pregnancy, unspecified: Secondary | ICD-10-CM

## 2021-10-05 MED ORDER — DOXYLAMINE-PYRIDOXINE 10-10 MG PO TBEC: 2.0000 | DELAYED_RELEASE_TABLET | Freq: Every day | ORAL | 5 refills | Status: DC

## 2021-10-05 NOTE — Progress Notes (Signed)
Rx diclegis sent per patient request.

## 2021-10-05 NOTE — Telephone Encounter (Addendum)
Triage voicemail: Patient reports she is starting to feel sick every day. Requesting rx for Diclegis. She had her telephone New OB Intake visit 10/04/21 and is scheduled for her Initial Prenatal 10/10/21 with Paula Compton, CNM. OV#[phone]

## 2021-10-05 NOTE — Telephone Encounter (Signed)
Spoke with patient. She has been feeling nauseas for the past few days, but today it feels like she's going to vomit. Reports she was sick her entire pregnancy with previous two pregnancies. Advised all providers have left the office for today. I will send message to provider on call, but am uncertain how busy Labor and Delivery is/when provider may see the message. Discussed OTC meds she can use and will send this information in my chart.

## 2021-10-06 NOTE — Telephone Encounter (Signed)
Left voicemail message to notify patient rx sent in. She has not seen the my chart messages that were sent to her. Advised to contact us back if she needed assistance getting into my chart.

## 2021-10-09 ENCOUNTER — Other Ambulatory Visit: Payer: Self-pay | Admitting: Advanced Practice Midwife

## 2021-10-09 DIAGNOSIS — O219 Vomiting of pregnancy, unspecified: Secondary | ICD-10-CM

## 2021-10-09 MED ORDER — BONJESTA 20-20 MG PO TBCR: 1.0000 | EXTENDED_RELEASE_TABLET | Freq: Two times a day (BID) | ORAL | 3 refills | Status: DC | PRN

## 2021-10-09 NOTE — Progress Notes (Signed)
Bonjesta ordered for patient.

## 2021-10-09 NOTE — Telephone Encounter (Signed)
Can you prescribe Bonjesta? Its covered by her insurance .

## 2021-10-10 ENCOUNTER — Other Ambulatory Visit (HOSPITAL_COMMUNITY)
Admission: RE | Admit: 2021-10-10 | Discharge: 2021-10-10 | Disposition: A | Discharge status: Home / Self Care | Payer: BC Managed Care – PPO | Source: Ambulatory Visit | Attending: Obstetrics | Admitting: Obstetrics

## 2021-10-10 ENCOUNTER — Ambulatory Visit (INDEPENDENT_AMBULATORY_CARE_PROVIDER_SITE_OTHER): Payer: BC Managed Care – PPO | Admitting: Obstetrics

## 2021-10-10 ENCOUNTER — Encounter: Payer: Self-pay | Admitting: Oncology

## 2021-10-10 ENCOUNTER — Telehealth: Payer: Self-pay

## 2021-10-10 ENCOUNTER — Encounter: Payer: Self-pay | Admitting: Obstetrics

## 2021-10-10 VITALS — BP 120/80 | Wt 251.0 lb

## 2021-10-10 DIAGNOSIS — Z3481 Encounter for supervision of other normal pregnancy, first trimester: Secondary | ICD-10-CM | POA: Diagnosis not present

## 2021-10-10 DIAGNOSIS — Z124 Encounter for screening for malignant neoplasm of cervix: Secondary | ICD-10-CM

## 2021-10-10 DIAGNOSIS — Z3480 Encounter for supervision of other normal pregnancy, unspecified trimester: Secondary | ICD-10-CM | POA: Diagnosis not present

## 2021-10-10 DIAGNOSIS — Z3A12 12 weeks gestation of pregnancy: Secondary | ICD-10-CM | POA: Diagnosis not present

## 2021-10-10 DIAGNOSIS — Z3201 Encounter for pregnancy test, result positive: Secondary | ICD-10-CM

## 2021-10-10 DIAGNOSIS — Z113 Encounter for screening for infections with a predominantly sexual mode of transmission: Secondary | ICD-10-CM | POA: Insufficient documentation

## 2021-10-10 LAB — POCT URINE PREGNANCY: Preg Test, Ur: POSITIVE — AB

## 2021-10-10 NOTE — Progress Notes (Signed)
New Obstetric Patient H&P    Chief Complaint: "Desires prenatal care"   History of Present Illness: Patient is a 31 y.o. [id number] Not Hispanic or Latino female, LMP 07/17/2021 presents with amenorrhea and positive home pregnancy test. Based on her  LMP, her EDD is Estimated Date of Delivery: 04/23/22 and her EGA is [redacted]w[redacted]d Cycles are 6. days, regular, and occur approximately every : 28 days. Her last pap smear was about 1.5 years ago and was no abnormalities.    She had a urine pregnancy test which was positive about 4 week(s)  ago. Her last menstrual period was normal and lasted for  about 5 day(s). Since her LMP she claims she has experienced nausea and vomiting and fatigue.. She denies vaginal bleeding. Her past medical history is noncontributory. Her prior pregnancies are notable for two previous Cesarean sections  Since her LMP, she admits to the use of tobacco products  no She claims she has gained   no pounds since the start of her pregnancy.  There are cats in the home in the home  no She admits close contact with children on a regular basis  yes  She has had chicken pox in the past yes She has had Tuberculosis exposures, symptoms, or previously tested positive for TB   no Current or past history of domestic violence. no  Genetic Screening/Teratology Counseling: (Includes patient, baby's father, or anyone in either family with:)   [facility] 2. Thalassemia ([place] or [place] background): MCV<80  no 3. Neural tube defect (meningomyelocele, spina bifida, anencephaly)  no 4. Congenital heart defect  no  5. Down syndrome  no 6. Tay-Sachs (Jewish, [place]  no 7. Canavan's Disease  no 8. Sickle cell disease or trait (African)  no  9. Hemophilia or other blood disorders  no  10. Muscular dystrophy  no  11. Cystic fibrosis  no  12. Huntington's Chorea  no  13. Mental retardation/autism  no 14. Other inherited genetic or  chromosomal disorder  no 15. Maternal metabolic disorder (DM, PKU, etc)  no 16. Patient or FOB with a child with a birth defect not listed above no  16a. Patient or FOB with a birth defect themselves no 17. Recurrent pregnancy loss, or stillbirth  no  18. Any medications since LMP other than prenatal vitamins (include vitamins, supplements, OTC meds, drugs, alcohol)  no 19. Any other genetic/environmental exposure to discuss  no  Infection History:   1. Lives with someone with TB or TB exposed  no  2. Patient or partner has history of genital herpes  no 3. Rash or viral illness since LMP  no 4. History of STI (GC, CT, HPV, syphilis, HIV)  no 5. History of recent travel :  no  Other pertinent information:  no     Review of Systems:10 point review of systems negative unless otherwise noted in HPI  Past Medical History:  Past Medical History:  Diagnosis Date  . Abdominal pain   . Amenorrhea   . Anemia   . Anemia affecting pregnancy 05/15/2017   Hgb 8.0 at 28 weeks _0  hematology referral  . Chlamydia 2012   tx'd  . Family history of ovarian cancer 07/2015   genetic testing letter sent; 4/22 PALB2 positive  . Genetic testing of female 08/2020   MyRisk PALB2 positive, with 2 MSH3, POLE, and RNF43 VUS  . Hemorrhoids   . Low-lying placenta in  third trimester   . Missed abortion 06/01/2014   RPH  . Monoallelic mutation of PALB2 gene 08/2020   MyRisk; increased risk of breast/ovar/pancreatic cancer    Past Surgical History:  Past Surgical History:  Procedure Laterality Date  . CESAREAN SECTION N/A 06/27/2017   Procedure: CESAREAN SECTION;  Surgeon: Malachy Mood, MD;  Location: [facility] ORS;  Service: Obstetrics;  Laterality: N/A;  . CESAREAN SECTION N/A 07/09/2019   Procedure: CESAREAN SECTION;  Surgeon: Gae Dry, MD;  Location: [facility] ORS;  Service: Obstetrics;  Laterality: N/A;  . DILATION AND CURETTAGE OF UTERUS  06/01/2014  . EYE SURGERY Left    when an infant  .  MOUTH SURGERY Left 2017   root canal    Gynecologic History: Patient's last menstrual period was 07/17/2021 (exact date).  Obstetric History: [id number]  Family History:  Family History  Problem Relation Age of Onset  . Diabetes Maternal Grandmother   . Ovarian cancer Maternal Grandmother 60  . Anemia Mother   . Anemia Sister     Social History:  Social History   Socioeconomic History  . Marital status: Married    Spouse name: Not on file  . Number of children: 2  . Years of education: 70  . Highest education level: Not on file  Occupational History  . 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 Smoking status: Never  . Smokeless tobacco: Never  Vaping Use  . Vaping Use: Never used  Substance and Sexual Activity  . Alcohol use: No  . Drug use: No  . Sexual activity: Yes    Birth control/protection: None  Other Topics Concern  . Not on file  Social History Narrative  . Not on file   Social Determinants of Health   Financial Resource Strain: Low Risk   . Difficulty of Paying Living Expenses: Not very hard  Food Insecurity: No Food Insecurity  . Worried About Charity fundraiser in the Last Year: Never true  . Ran Out of Food in the Last Year: Never true  Transportation Needs: No Transportation Needs  . Lack of Transportation (Medical): No  . Lack of Transportation (Non-Medical): No  Physical Activity: Insufficiently Active  . Days of Exercise per Week: 2 days  . Minutes of Exercise per Session: 20 min  Stress: No Stress Concern Present  . Feeling of Stress : Not at all  Social Connections: Unknown  . Frequency of Communication with Friends and Family: Once a week  . Frequency of Social Gatherings with Friends and Family: Twice a week  . Attends Religious Services: Not on file  . Active Member of Clubs or Organizations: No  . Attends Archivist Meetings: Never  . Marital Status: Married  Human resources officer Violence: Not At Risk  . Fear of  Current or Ex-Partner: No  . Emotionally Abused: No  . Physically Abused: No  . Sexually Abused: No    Allergies:  No Known Allergies  Medications: Prior to Admission medications   Medication Sig Start Date End Date Taking? Authorizing Provider  Doxylamine-Pyridoxine ER (BONJESTA) 20-20 MG TBCR Take 1 tablet by mouth every 12 (twelve) hours as needed. Patient not taking: Reported on 10/10/2021 10/09/21   Rod Can, CNM    Physical Exam Vitals: Blood pressure 120/80, weight 251 lb (113.9 kg), last menstrual period 07/17/2021.  General: NAD HEENT: normocephalic, anicteric Thyroid: no enlargement, no palpable nodules Pulmonary: No increased work of breathing, CTAB Cardiovascular: RRR, distal pulses 2+ Abdomen: NABS, soft,  non-tender, non-distended.  Umbilicus without lesions.  No hepatomegaly, splenomegaly or masses palpable. No evidence of hernia  Genitourinary:  External: Normal external female genitalia.  Normal urethral meatus, normal  Bartholin's and Skene's glands.    Vagina: Normal vaginal mucosa, no evidence of prolapse.    Cervix: Grossly normal in appearance, no bleeding  Uterus: anteverted, Non-enlarged, mobile, normal contour.  No CMT  Adnexa: ovaries non-enlarged, no adnexal masses  Rectal: deferred Extremities: no edema, erythema, or tenderness Neurologic: Grossly intact Psychiatric: mood appropriate, affect full   Assessment: 31 y.o. [id number] at 87w1dpresenting to initiate prenatal care Uncertain dating. Unable to obtain FHTs with doppler.  Plan: 1) Avoid alcoholic beverages. 2) Patient encouraged not to smoke.  3) Discontinue the use of all non-medicinal drugs and chemicals.  4) Take prenatal vitamins daily.  5) Nutrition, food safety (fish, cheese advisories, and high nitrite foods) and exercise discussed. 6) Hospital and practice style discussed with cross coverage system.  7) Genetic Screening, such as with 1st Trimester Screening, cell free fetal  DNA, AFP testing, and Ultrasound, as well as with amniocentesis and CVS as appropriate, is discussed with patient. At the conclusion of today's visit patient declined genetic testing 8) Patient is asked about travel to areas at risk for the ZCongovirus, and counseled to avoid travel and exposure to mosquitoes or sexual partners who may have themselves been exposed to the virus. Testing is discussed, and will be ordered as appropriate.   She expresses interest in having a TOLAC.Her first CS Unable to get FHTS today, although the patient is nauseous and denies any vaginal bleeding. She is uncertain about her dating and shares that she suspecst that she is around [redacted] weeks along. An ultrasound for dating is ordered to be done within the week for her. She will RTC for her next appointment in 4 weeks. MImagene Riches CNM  10/10/2021 5:10 PM

## 2021-10-10 NOTE — Telephone Encounter (Signed)
Pt calling; bonjesta needs PA; she is very sick.  [phone]  Pt states vitamin B6 and unisom doesn't work for her; needs PA on bonjesta; wanted to know how long it would take. Adv it can take a few days to let me give her some other things to try in the meantime: try to eat something every 3 hours if only saltine crackers; try not to let stomach get empty or too full; eat small freq bland meals, avoid greasy, spicy, highly acidic foods or foods with strong odor, tak pnv at HS c a snack; ginger 250mg  qid or ginger tea; pepcid or zantac per directions on pkg.  Adv I will work on .

## 2021-10-11 ENCOUNTER — Encounter: Payer: Self-pay | Admitting: Oncology

## 2021-10-11 LAB — RPR+RH+ABO+RUB AB+AB SCR+CB...
Antibody Screen: NEGATIVE
HIV Screen 4th Generation wRfx: NONREACTIVE
Hematocrit: 37.9 % (ref 34.0–46.6)
Hemoglobin: 12.1 g/dL (ref 11.1–15.9)
Hepatitis B Surface Ag: NEGATIVE
MCH: 24.2 pg — ABNORMAL LOW (ref 26.6–33.0)
MCHC: 31.9 g/dL (ref 31.5–35.7)
MCV: 76 fL — ABNORMAL LOW (ref 79–97)
Platelets: 342 10*3/uL (ref 150–450)
RBC: 5.01 x10E6/uL (ref 3.77–5.28)
RDW: 13.7 % (ref 11.7–15.4)
RPR Ser Ql: NONREACTIVE
Rh Factor: NEGATIVE
Rubella Antibodies, IGG: 1.91 index (ref >0.99)
Varicella zoster IgG: 1477 index (ref >165)
WBC: 12.2 10*3/uL — ABNORMAL HIGH (ref 3.4–10.8)

## 2021-10-12 ENCOUNTER — Other Ambulatory Visit: Payer: Self-pay

## 2021-10-12 ENCOUNTER — Encounter: Payer: Self-pay | Admitting: *Deleted

## 2021-10-12 ENCOUNTER — Emergency Department
Admission: EM | Admit: 2021-10-12 | Discharge: 2021-10-13 | Disposition: A | Discharge status: Home / Self Care | Payer: BC Managed Care – PPO | Attending: Student in an Organized Health Care Education/Training Program | Admitting: Student in an Organized Health Care Education/Training Program

## 2021-10-12 ENCOUNTER — Emergency Department: Payer: BC Managed Care – PPO

## 2021-10-12 ENCOUNTER — Encounter: Payer: Self-pay | Admitting: Oncology

## 2021-10-12 DIAGNOSIS — O210 Mild hyperemesis gravidarum: Secondary | ICD-10-CM | POA: Insufficient documentation

## 2021-10-12 DIAGNOSIS — Z3A01 Less than 8 weeks gestation of pregnancy: Secondary | ICD-10-CM | POA: Diagnosis not present

## 2021-10-12 DIAGNOSIS — R110 Nausea: Secondary | ICD-10-CM

## 2021-10-12 DIAGNOSIS — O26891 Other specified pregnancy related conditions, first trimester: Secondary | ICD-10-CM | POA: Diagnosis not present

## 2021-10-12 DIAGNOSIS — R102 Pelvic and perineal pain: Secondary | ICD-10-CM | POA: Diagnosis not present

## 2021-10-12 DIAGNOSIS — O219 Vomiting of pregnancy, unspecified: Secondary | ICD-10-CM | POA: Diagnosis not present

## 2021-10-12 LAB — CBC
HCT: 37.1 % (ref 36.0–46.0)
Hemoglobin: 11.4 g/dL — ABNORMAL LOW (ref 12.0–15.0)
MCH: 23.8 pg — ABNORMAL LOW (ref 26.0–34.0)
MCHC: 30.7 g/dL (ref 30.0–36.0)
MCV: 77.3 fL — ABNORMAL LOW (ref 80.0–100.0)
Platelets: 328 10*3/uL (ref 150–400)
RBC: 4.8 MIL/uL (ref 3.87–5.11)
RDW: 13.8 % (ref 11.5–15.5)
WBC: 12.2 10*3/uL — ABNORMAL HIGH (ref 4.0–10.5)
nRBC: 0 % (ref 0.0–0.2)

## 2021-10-12 LAB — URINALYSIS, ROUTINE W REFLEX MICROSCOPIC
Bilirubin Urine: NEGATIVE
Glucose, UA: NEGATIVE mg/dL
Hgb urine dipstick: NEGATIVE
Ketones, ur: 5 mg/dL — AB
Nitrite: NEGATIVE
Protein, ur: 30 mg/dL — AB
Specific Gravity, Urine: 1.032 — ABNORMAL HIGH (ref 1.005–1.030)
pH: 6 (ref 5.0–8.0)

## 2021-10-12 LAB — COMPREHENSIVE METABOLIC PANEL
ALT: 13 U/L (ref 0–44)
AST: 20 U/L (ref 15–41)
Albumin: 4.1 g/dL (ref 3.5–5.0)
Alkaline Phosphatase: 59 U/L (ref 38–126)
Anion gap: 7 (ref 5–15)
BUN: 8 mg/dL (ref 6–20)
CO2: 23 mmol/L (ref 22–32)
Calcium: 8.9 mg/dL (ref 8.9–10.3)
Chloride: 102 mmol/L (ref 98–111)
Creatinine, Ser: 0.63 mg/dL (ref 0.44–1.00)
GFR, Estimated: >60 mL/min (ref >60)
Glucose, Bld: 95 mg/dL (ref 70–99)
Potassium: 3.5 mmol/L (ref 3.5–5.1)
Sodium: 132 mmol/L — ABNORMAL LOW (ref 135–145)
Total Bilirubin: 1.1 mg/dL (ref 0.3–1.2)
Total Protein: 7.8 g/dL (ref 6.5–8.1)

## 2021-10-12 LAB — URINE CULTURE

## 2021-10-12 LAB — HCG, QUANTITATIVE, PREGNANCY: hCG, Beta Chain, Quant, S: 90303 m[IU]/mL — ABNORMAL HIGH (ref <5)

## 2021-10-12 LAB — LIPASE, BLOOD: Lipase: 28 U/L (ref 11–51)

## 2021-10-12 MED ORDER — SODIUM CHLORIDE 0.9 % IV BOLUS
1000.0000 mL | Freq: Once | INTRAVENOUS | Status: AC
  Administered 2021-10-12: 1000 mL via INTRAVENOUS

## 2021-10-12 MED ORDER — ONDANSETRON 4 MG PO TBDP: 4.0000 mg | ORAL_TABLET | Freq: Three times a day (TID) | ORAL | 0 refills | Status: DC | PRN

## 2021-10-12 MED ORDER — ONDANSETRON HCL 4 MG/2ML IJ SOLN
4.0000 mg | Freq: Once | INTRAMUSCULAR | Status: AC
Start: 2021-10-12 — End: 2021-10-12
  Administered 2021-10-12: 4 mg via INTRAVENOUS
  Filled 2021-10-12: qty 2

## 2021-10-12 NOTE — ED Provider Notes (Signed)
[facility] Provider Note    Event Date/Time   First MD Initiated Contact with Patient 10/12/21 1957     (approximate)   History   Abdominal Pain   HPI  Deborah House is a 31 y.o. female who was recently pregnant uncertain as to how far along she has presents to the ER for evaluation of nausea vomiting the past several days feeling weak.  Has not been able to keep anything down x24 hours.  Denies any chest pain or shortness of breath.  He is having some cramping pelvic pain.  No vaginal bleeding or discharge.  This is her fourth pregnancy and she has had hyperemesis with all the others.     Physical Exam   Triage Vital Signs: ED Triage Vitals  Enc Vitals Group     BP 10/12/21 1927 123/73     Pulse Rate 10/12/21 1927 88     Resp 10/12/21 1927 20     Temp 10/12/21 1927 98.4 F (36.9 C)     Temp Source 10/12/21 1927 Oral     SpO2 10/12/21 1927 100 %     Weight 10/12/21 1925 250 lb (113.4 kg)     Height 10/12/21 1925 5\' 6"  (1.676 m)     Head Circumference --      Peak Flow --      Pain Score 10/12/21 1925 8     Pain Loc --      Pain Edu? --      Excl. in [place]? --     Most recent vital signs: Vitals:   10/12/21 1927  BP: 123/73  Pulse: 88  Resp: 20  Temp: 98.4 F (36.9 C)  SpO2: 100%     Constitutional: Alert  Eyes: Conjunctivae are normal.  Head: Atraumatic. Nose: No congestion/rhinnorhea. Mouth/Throat: Mucous membranes are moist.   Neck: Painless ROM.  Cardiovascular:   Good peripheral circulation. No m/g/r Respiratory: Normal respiratory effort.  No retractions.  Gastrointestinal: Soft non tender, gravid, no guarding or rebound Musculoskeletal:  no deformity Neurologic:  MAE spontaneously. No gross focal neurologic deficits are appreciated.  Skin:  Skin is warm, dry and intact. No rash noted. Psychiatric: Mood and affect are normal. Speech and behavior are normal.    ED Results / Procedures / Treatments   Labs (all  labs ordered are listed, but only abnormal results are displayed) Labs Reviewed  CBC - Abnormal; Notable for the following components:      Result Value   WBC 12.2 (*)    Hemoglobin 11.4 (*)    MCV 77.3 (*)    MCH 23.8 (*)    All other components within normal limits  COMPREHENSIVE METABOLIC PANEL - Abnormal; Notable for the following components:   Sodium 132 (*)    All other components within normal limits  URINALYSIS, ROUTINE W REFLEX MICROSCOPIC - Abnormal; Notable for the following components:   Color, Urine AMBER (*)    APPearance HAZY (*)    Specific Gravity, Urine 1.032 (*)    Ketones, ur 5 (*)    Protein, ur 30 (*)    Leukocytes,Ua MODERATE (*)    Bacteria, UA RARE (*)    All other components within normal limits  HCG, QUANTITATIVE, PREGNANCY - Abnormal; Notable for the following components:   hCG, Beta Chain, Quant, S 90,303 (*)    All other components within normal limits  LIPASE, BLOOD     EKG     RADIOLOGY Please see ED  Course for my review and interpretation.  I personally reviewed all radiographic images ordered to evaluate for the above acute complaints and reviewed radiology reports and findings.  These findings were personally discussed with the patient.  Please see medical record for radiology report.    PROCEDURES:  Critical Care performed: No  Procedures   MEDICATIONS ORDERED IN ED: Medications  sodium chloride 0.9 % bolus 1,000 mL (0 mLs Intravenous Stopped 10/12/21 2129)  ondansetron (ZOFRAN) injection 4 mg (4 mg Intravenous Given 10/12/21 2025)  sodium chloride 0.9 % bolus 1,000 mL (1,000 mLs Intravenous New Bag/Given 10/12/21 2129)     IMPRESSION / MDM / ASSESSMENT AND PLAN / ED COURSE  I reviewed the triage vital signs and the nursing notes.                              Differential diagnosis includes, but is not limited to, hyperemesis, electrolyte abnormality, dehydration, UTI, ectopic, molar pregnancy  Patient presented to the ER  for evaluation of nausea vomiting dehydration and early pregnancy.  She is having some crampy pelvic pain no vaginal bleeding but uncertain as to dates and location of pregnancy therefore will order ultrasound to rule out ectopic.  This presenting complaint could reflect a potentially life-threatening illness therefore laboratory evaluation will be sent to evaluate for the above complaints.      Clinical Course as of 10/12/21 2323  Thu Oct 12, 2021  2319 Patient reassessed.  Feels improved.  She is tolerating p.o.  Denies any dysuria.  Urinalysis appears consistent with a contaminant.  At this point does appear stable and appropriate for outpatient follow-up. [PR]    Clinical Course User Index [PR] Merlyn Lot, MD    Patient's presentation is most consistent with acute, uncomplicated illness.   FINAL CLINICAL IMPRESSION(S) / ED DIAGNOSES   Final diagnoses:  Nausea     Rx / DC Orders   ED Discharge Orders          Ordered    ondansetron (ZOFRAN-ODT) 4 MG disintegrating tablet  Every 8 hours PRN        10/12/21 2323             Note:  This document was prepared using Dragon voice recognition software and may include unintentional dictation errors.    Merlyn Lot, MD 10/12/21 [phone]

## 2021-10-12 NOTE — ED Notes (Signed)
Patient reports to feel better.

## 2021-10-12 NOTE — ED Triage Notes (Signed)
Pt is pregnant.  Pt has n/v for 3 days.   Pt reports abd pain.  G4p2a1  no vag bleeding  no urinary sx.  Pt alert.

## 2021-10-17 LAB — CYTOLOGY - PAP: Diagnosis: NEGATIVE

## 2021-10-18 ENCOUNTER — Ambulatory Visit: Admission: RE | Admit: 2021-10-18 | Payer: BC Managed Care – PPO | Source: Ambulatory Visit

## 2021-10-18 ENCOUNTER — Other Ambulatory Visit: Payer: Self-pay

## 2021-10-18 LAB — CERVICOVAGINAL ANCILLARY ONLY
Bacterial Vaginitis (gardnerella): NEGATIVE
Chlamydia: NEGATIVE
Comment: NEGATIVE
Comment: NEGATIVE
Comment: NEGATIVE
Comment: NORMAL
Neisseria Gonorrhea: NEGATIVE
Trichomonas: NEGATIVE

## 2021-11-07 ENCOUNTER — Encounter: Payer: BC Managed Care – PPO | Admitting: Licensed Practical Nurse

## 2021-11-07 ENCOUNTER — Encounter: Payer: Self-pay | Admitting: Advanced Practice Midwife

## 2021-11-07 ENCOUNTER — Ambulatory Visit (INDEPENDENT_AMBULATORY_CARE_PROVIDER_SITE_OTHER): Payer: BC Managed Care – PPO | Admitting: Advanced Practice Midwife

## 2021-11-07 VITALS — BP 110/60 | Wt 256.0 lb

## 2021-11-07 DIAGNOSIS — E669 Obesity, unspecified: Secondary | ICD-10-CM

## 2021-11-07 DIAGNOSIS — Z369 Encounter for antenatal screening, unspecified: Secondary | ICD-10-CM

## 2021-11-07 DIAGNOSIS — Z131 Encounter for screening for diabetes mellitus: Secondary | ICD-10-CM

## 2021-11-07 DIAGNOSIS — Z130 Encounter for screening for diseases of the blood and blood-forming organs and certain disorders involving the immune mechanism: Secondary | ICD-10-CM

## 2021-11-07 DIAGNOSIS — Z3481 Encounter for supervision of other normal pregnancy, first trimester: Secondary | ICD-10-CM | POA: Diagnosis not present

## 2021-11-07 DIAGNOSIS — Z1159 Encounter for screening for other viral diseases: Secondary | ICD-10-CM | POA: Diagnosis not present

## 2021-11-07 DIAGNOSIS — O0991 Supervision of high risk pregnancy, unspecified, first trimester: Secondary | ICD-10-CM

## 2021-11-07 DIAGNOSIS — Z1379 Encounter for other screening for genetic and chromosomal anomalies: Secondary | ICD-10-CM

## 2021-11-07 DIAGNOSIS — O99211 Obesity complicating pregnancy, first trimester: Secondary | ICD-10-CM

## 2021-11-07 DIAGNOSIS — Z3A10 10 weeks gestation of pregnancy: Secondary | ICD-10-CM

## 2021-11-07 DIAGNOSIS — Z113 Encounter for screening for infections with a predominantly sexual mode of transmission: Secondary | ICD-10-CM

## 2021-11-07 LAB — POCT URINALYSIS DIPSTICK OB: Glucose, UA: NEGATIVE

## 2021-11-07 LAB — HEPATITIS C ANTIBODY: HCV Ab: NEGATIVE

## 2021-11-07 NOTE — Progress Notes (Signed)
Routine Prenatal Care Visit  Subjective  Deborah House is a 31 y.o. [phone] at [redacted]w[redacted]d being seen today for ongoing prenatal care.  She is currently monitored for the following issues for this high-risk pregnancy and has Obesity affecting pregnancy; Anemia affecting pregnancy; and Supervision of high-risk pregnancy on their problem list.  ----------------------------------------------------------------------------------- Patient reports ongoing nausea/vomiting somewhat helped by medications. She is waiting on preauthorization for Bonjesta.   Contractions: Not present. Vag. Bleeding: None.   . Leaking Fluid denies.  ----------------------------------------------------------------------------------- The following portions of the patient's history were reviewed and updated as appropriate: allergies, current medications, past family history, past medical history, past social history, past surgical history and problem list. Problem list updated.  Objective  Blood pressure 110/60, weight 256 lb (116.1 kg), last menstrual period 07/17/2021. Pregravid weight 245 lb (111.1 kg) Total Weight Gain 11 lb (4.99 kg) Urinalysis: Urine Protein Small (1+)  Urine Glucose Negative  Fetal Status: Fetal Heart Rate (bpm): 154         General:  Alert, oriented and cooperative. Patient is in no acute distress.  Skin: Skin is warm and dry. No rash noted.   Cardiovascular: Normal heart rate noted  Respiratory: Normal respiratory effort, no problems with respiration noted  Abdomen: Soft, gravid, appropriate for gestational age. Pain/Pressure: Absent     Pelvic:  Cervical exam deferred        Extremities: Normal range of motion.     Mental Status: Normal mood and affect. Normal behavior. Normal judgment and thought content.   Assessment   31 y.o. [id number] at [redacted]w[redacted]d by  06/01/2022, by Ultrasound presenting for routine prenatal visit  Plan   fourth Problems (from 10/04/21 to present)    No problems associated  with this episode.       Preterm labor symptoms and general obstetric precautions including but not limited to vaginal bleeding, contractions, leaking of fluid and fetal movement were reviewed in detail with the patient. Please refer to After Visit Summary for other counseling recommendations.  N/V: small frequent intake, stay well hydrated  Return in about 4 weeks (around 12/05/2021) for rob.  Tresea Mall, CNM 11/07/2021 3:03 PM

## 2021-11-08 LAB — HGB A1C W/O EAG: Hgb A1c MFr Bld: 5.7 % — ABNORMAL HIGH (ref 4.8–5.6)

## 2021-11-10 LAB — HGB FRACTIONATION CASCADE
Hgb A2: 2.6 % (ref 1.8–3.2)
Hgb A: 97.4 % (ref 96.4–98.8)
Hgb F: 0 % (ref 0.0–2.0)
Hgb S: 0 %

## 2021-11-10 LAB — HEPATITIS C ANTIBODY: Hep C Virus Ab: NONREACTIVE

## 2021-11-12 LAB — MATERNIT21 PLUS CORE+SCA
Fetal Fraction: 4
Monosomy X (Turner Syndrome): NOT DETECTED
Result (T21): NEGATIVE
Trisomy 13 (Patau syndrome): NEGATIVE
Trisomy 18 (Edwards syndrome): NEGATIVE
Trisomy 21 (Down syndrome): NEGATIVE
XXX (Triple X Syndrome): NOT DETECTED
XXY (Klinefelter Syndrome): NOT DETECTED
XYY (Jacobs Syndrome): NOT DETECTED

## 2021-11-17 NOTE — Telephone Encounter (Deleted)
Inetta Fermo with [facility] Intake calling to inquire about member consent form that was to be signed and faxed back with patient's appeal for [facility]. Samara Deist was denied 10/12/21. I resubmitted Prior authorization on 10/13/21 and received APPROVAL. Per Inetta Fermo, it was also denied. Patient previously came to office to sign the member consent form. Verified that the form was faxed with the appeal per Jessica P. Forms were shredded after confirmation was received. Inetta Fermo is sending the patient a new member consent form. The patient will sign and attached the document to a my chart message and send to [place]. We will print and fax. Per Inetta Fermo, it is a 30 day turn around unless marked urgent, then it will be ~72 hours.

## 2021-11-17 NOTE — Telephone Encounter (Signed)
Inetta Fermo with [facility] Intake calling to inquire about member consent form that was to be signed and faxed back with patient's appeal for [facility]. Samara Deist was denied 10/12/21. She reports they have not received the member consent form that the patient came to office to sign. Verified that the form was faxed with the appeal per Jessica P. Forms were shredded after confirmation was received. Inetta Fermo is sending the patient a new member consent form. The patient will sign and attached the document to a my chart message and send to [place]. We will print and fax. Per Inetta Fermo, it is a 30 day turn around unless marked urgent, then it will be ~72 hours.

## 2021-11-24 ENCOUNTER — Encounter: Payer: Self-pay | Admitting: Emergency Medicine

## 2021-11-24 ENCOUNTER — Telehealth: Payer: Self-pay

## 2021-11-24 ENCOUNTER — Encounter: Payer: Self-pay | Admitting: Advanced Practice Midwife

## 2021-11-24 DIAGNOSIS — O98511 Other viral diseases complicating pregnancy, first trimester: Secondary | ICD-10-CM | POA: Diagnosis not present

## 2021-11-24 DIAGNOSIS — O210 Mild hyperemesis gravidarum: Secondary | ICD-10-CM | POA: Diagnosis not present

## 2021-11-24 DIAGNOSIS — U071 COVID-19: Secondary | ICD-10-CM | POA: Insufficient documentation

## 2021-11-24 DIAGNOSIS — Z3A13 13 weeks gestation of pregnancy: Secondary | ICD-10-CM | POA: Diagnosis not present

## 2021-11-24 DIAGNOSIS — O219 Vomiting of pregnancy, unspecified: Secondary | ICD-10-CM | POA: Diagnosis not present

## 2021-11-24 DIAGNOSIS — R000 Tachycardia, unspecified: Secondary | ICD-10-CM | POA: Diagnosis not present

## 2021-11-24 LAB — COMPREHENSIVE METABOLIC PANEL
ALT: 17 U/L (ref 0–44)
AST: 22 U/L (ref 15–41)
Albumin: 3.6 g/dL (ref 3.5–5.0)
Alkaline Phosphatase: 56 U/L (ref 38–126)
Anion gap: 9 (ref 5–15)
BUN: 5 mg/dL — ABNORMAL LOW (ref 6–20)
CO2: 20 mmol/L — ABNORMAL LOW (ref 22–32)
Calcium: 8.8 mg/dL — ABNORMAL LOW (ref 8.9–10.3)
Chloride: 107 mmol/L (ref 98–111)
Creatinine, Ser: 0.64 mg/dL (ref 0.44–1.00)
GFR, Estimated: >60 mL/min (ref >60)
Glucose, Bld: 110 mg/dL — ABNORMAL HIGH (ref 70–99)
Potassium: 3.7 mmol/L (ref 3.5–5.1)
Sodium: 136 mmol/L (ref 135–145)
Total Bilirubin: 0.9 mg/dL (ref 0.3–1.2)
Total Protein: 7.5 g/dL (ref 6.5–8.1)

## 2021-11-24 LAB — CBC
HCT: 34.5 % — ABNORMAL LOW (ref 36.0–46.0)
Hemoglobin: 10.9 g/dL — ABNORMAL LOW (ref 12.0–15.0)
MCH: 24.1 pg — ABNORMAL LOW (ref 26.0–34.0)
MCHC: 31.6 g/dL (ref 30.0–36.0)
MCV: 76.2 fL — ABNORMAL LOW (ref 80.0–100.0)
Platelets: 289 10*3/uL (ref 150–400)
RBC: 4.53 MIL/uL (ref 3.87–5.11)
RDW: 15.2 % (ref 11.5–15.5)
WBC: 10.3 10*3/uL (ref 4.0–10.5)
nRBC: 0 % (ref 0.0–0.2)

## 2021-11-24 LAB — LIPASE, BLOOD: Lipase: 30 U/L (ref 11–51)

## 2021-11-24 MED ORDER — ONDANSETRON HCL 4 MG/2ML IJ SOLN
4.0000 mg | Freq: Once | INTRAMUSCULAR | Status: AC
  Administered 2021-11-24: 4 mg via INTRAVENOUS
  Filled 2021-11-24: qty 2

## 2021-11-24 MED ORDER — DEXTROSE IN LACTATED RINGERS 5 % IV SOLN: INTRAVENOUS | Status: DC

## 2021-11-24 NOTE — ED Provider Triage Note (Signed)
Emergency Medicine Provider Triage Evaluation Note  Kalleigh Harbor, a 31 y.o. female at [redacted] weeks gestation was evaluated in triage.  Pt complains of nausea, vomiting, and diarrhea.  Patient was diagnosed yesterday with COVID, and has had poor oral intake since that time she is taken Tylenol OTC for symptom relief.  No COVID antiviral medications at this time.  Review of Systems  Positive: NVD Negative: FCS  Physical Exam  BP 117/81 (BP Location: Left Arm)   Pulse (!) 118   Temp (!) 97.4 F (36.3 C) (Oral)   Resp 17   LMP 07/17/2021 (Exact Date)   SpO2 98%  Gen:   Awake, no distress  NAD Resp:  Normal effort CTA MSK:   Moves extremities without difficulty  RRR:  Tachy rate  Medical Decision Making  Medically screening exam initiated at 9:19 PM.  Appropriate orders placed.  Kourtni Ellise Kovack was informed that the remainder of the evaluation will be completed by another provider, this initial triage assessment does not replace that evaluation, and the importance of remaining in the ED until their evaluation is complete.  Patient to the ED for evaluation of persistent nausea, vomiting, diarrhea.  Patient with a recent COVID diagnosis [redacted] weeks gestational age.   Lissa Hoard, PA-C 11/24/21 2121

## 2021-11-24 NOTE — Telephone Encounter (Signed)
Patient contacted office stating that she tested positive for covid last night. Patient states that she has  congestion, headache, itchy throat and no fever. I advised patient to contact her PCP office to let them be aware, patient states that triage nurse on call last night advised her to take Tylenol Cold and Flu, patient states that she was not reaching out to her PCP because she is pregnant and wanted to know what you would recommend her to take. Please advise. KW

## 2021-11-24 NOTE — ED Triage Notes (Signed)
Pt presents via POV with complaints of N/V/D for the last day. Pt tested positive for COVID yesterday and is [redacted] weeks pregnant. Denies CP or SOB.

## 2021-11-25 ENCOUNTER — Emergency Department
Admission: EM | Admit: 2021-11-25 | Discharge: 2021-11-25 | Disposition: A | Discharge status: Home / Self Care | Payer: BC Managed Care – PPO | Attending: Emergency Medicine | Admitting: Emergency Medicine

## 2021-11-25 DIAGNOSIS — O210 Mild hyperemesis gravidarum: Secondary | ICD-10-CM

## 2021-11-25 DIAGNOSIS — U071 COVID-19: Secondary | ICD-10-CM

## 2021-11-25 MED ORDER — METOCLOPRAMIDE HCL 5 MG/ML IJ SOLN
10.0000 mg | Freq: Once | INTRAMUSCULAR | Status: AC
Start: 2021-11-25 — End: 2021-11-25
  Administered 2021-11-25: 10 mg via INTRAVENOUS
  Filled 2021-11-25: qty 2

## 2021-11-25 MED ORDER — SODIUM CHLORIDE 0.9 % IV BOLUS
1000.0000 mL | Freq: Once | INTRAVENOUS | Status: AC
Start: 2021-11-25 — End: 2021-11-25
  Administered 2021-11-25: 1000 mL via INTRAVENOUS

## 2021-11-25 MED ORDER — NIRMATRELVIR/RITONAVIR (PAXLOVID)TABLET: 3.0000 | ORAL_TABLET | Freq: Two times a day (BID) | ORAL | 0 refills | Status: AC

## 2021-11-25 MED ORDER — METOCLOPRAMIDE HCL 10 MG PO TABS: 10.0000 mg | ORAL_TABLET | Freq: Four times a day (QID) | ORAL | 0 refills | Status: DC | PRN

## 2021-11-25 NOTE — ED Provider Notes (Signed)
[facility] Provider Note    Event Date/Time   First MD Initiated Contact with Patient 11/25/21 [phone]     (approximate)  History   Chief Complaint: Diarrhea  HPI  Deborah House is a 31 y.o. female approximately [redacted] weeks pregnant who presents to the emergency department for nausea vomiting and recently testing positive for COVID.  According to the patient she is [redacted] weeks pregnant and Thursday tested positive for COVID after starting with nausea vomiting and a scratchy throat on Thursday.  Patient has had vomiting throughout her pregnancy but it seems worse over the past 2 days.  Patient states a history of nausea vomiting with pregnancy in the past requiring diclegis, patient is currently taking doxylamine and pyridoxine and is working with her insurance to try to get coverage for this other medication.  Here the patient overall appears well, reassuring vitals besides mild tachycardia.  Patient received a liter of fluid in triage.  Patient denies any abdominal pain.  Denies any vaginal bleeding or discharge.  Physical Exam   Triage Vital Signs: ED Triage Vitals  Enc Vitals Group     BP 11/24/21 2115 117/81     Pulse Rate 11/24/21 2115 (!) 118     Resp 11/24/21 2115 17     Temp 11/24/21 2115 (!) 97.4 F (36.3 C)     Temp Source 11/24/21 2115 Oral     SpO2 11/24/21 2115 98 %     Weight 11/24/21 2120 245 lb (111.1 kg)     Height 11/24/21 2120 5\' 6"  (1.676 m)     Head Circumference --      Peak Flow --      Pain Score 11/24/21 2120 0     Pain Loc --      Pain Edu? --      Excl. in GC? --     Most recent vital signs: Vitals:   11/25/21 0734 11/25/21 0736  BP: 107/65   Pulse: (!) 105 (!) 105  Resp: 16   Temp:    SpO2: 98%     General: Awake, no distress.  CV:  Good peripheral perfusion.  Regular rate and rhythm  Resp:  Normal effort.  Equal breath sounds bilaterally.  Abd:  No distention.  Soft, nontender.  No rebound or guarding.  Benign  abdomen    ED Results / Procedures / Treatments   EKG  EKG viewed and interpreted by myself shows sinus tachycardia 124 bpm with a narrow QRS, normal axis, normal intervals, no concerning ST changes.   MEDICATIONS ORDERED IN ED: Medications  dextrose 5 % in lactated ringers infusion (0 mLs Intravenous Stopped 11/25/21 0554)  ondansetron (ZOFRAN) injection 4 mg (4 mg Intravenous Given 11/24/21 2134)  metoCLOPramide (REGLAN) injection 10 mg (10 mg Intravenous Given 11/25/21 0744)  sodium chloride 0.9 % bolus 1,000 mL (1,000 mLs Intravenous New Bag/Given 11/25/21 0744)     IMPRESSION / MDM / ASSESSMENT AND PLAN / ED COURSE  I reviewed the triage vital signs and the nursing notes.  Patient's presentation is most consistent with acute presentation with potential threat to life or bodily function.  Patient presents to the emergency department being [redacted] weeks pregnant with nausea and vomiting and testing positive for COVID yesterday.  Patient's work-up today in the emergency department is reassuring including a normal CMP, reassuring CBC and a negative lipase.  Overall the patient appears well with a reassuring physical exam and benign abdomen.  Patient continues to  states significant nausea.  We will dose IV Reglan as well as an additional liter of IV fluids.  I spoke to the patient regarding Paxlovid in pregnancy although it appears to be safe based on several small studies of has not been any large trial.  Patient understands the risk but wishes to take the medication.  We will prescribe this for the patient in addition to Reglan for nausea.  Patient agreeable to plan of care.  FINAL CLINICAL IMPRESSION(S) / ED DIAGNOSES   COVID Hyperemesis gravidarum   Note:  This document was prepared using Dragon voice recognition software and may include unintentional dictation errors.   Minna Antis, MD 11/25/21 [phone]

## 2021-11-25 NOTE — ED Notes (Signed)
Dc ppw provided to patient. followup and RX information reviewed. Pt provides verbal consent for dc at this time and declines vs at time of dc. Pt ambulatory to lobby alert and oriented. 

## 2021-11-25 NOTE — ED Notes (Signed)
Pt updated on wait, pt verbalizes understanding.  °

## 2021-11-25 NOTE — ED Notes (Signed)
Pt given crackers and cup of water for PO challenge

## 2021-11-25 NOTE — Discharge Instructions (Signed)
Please take your medications as prescribed.  Drink plenty of fluids.  Please follow-up with your OB/GYN as soon as possible for recheck/reevaluation.  Return to the emergency department for any symptom personally concerning to yourself.

## 2021-12-05 ENCOUNTER — Ambulatory Visit (INDEPENDENT_AMBULATORY_CARE_PROVIDER_SITE_OTHER): Payer: BC Managed Care – PPO | Admitting: Obstetrics & Gynecology

## 2021-12-05 VITALS — BP 120/80 | Wt 256.0 lb

## 2021-12-05 DIAGNOSIS — Z6791 Unspecified blood type, Rh negative: Secondary | ICD-10-CM

## 2021-12-05 DIAGNOSIS — O0991 Supervision of high risk pregnancy, unspecified, first trimester: Secondary | ICD-10-CM

## 2021-12-05 DIAGNOSIS — O99012 Anemia complicating pregnancy, second trimester: Secondary | ICD-10-CM

## 2021-12-05 DIAGNOSIS — O99211 Obesity complicating pregnancy, first trimester: Secondary | ICD-10-CM

## 2021-12-05 DIAGNOSIS — Z3A14 14 weeks gestation of pregnancy: Secondary | ICD-10-CM

## 2021-12-05 DIAGNOSIS — O26899 Other specified pregnancy related conditions, unspecified trimester: Secondary | ICD-10-CM

## 2021-12-05 LAB — POCT URINALYSIS DIPSTICK OB
Glucose, UA: NEGATIVE
POC,PROTEIN,UA: NEGATIVE

## 2021-12-05 MED ORDER — FERROUS SULFATE 325 (65 FE) MG PO TABS: 325.0000 mg | ORAL_TABLET | Freq: Every day | ORAL | 1 refills | Status: DC

## 2021-12-05 NOTE — Progress Notes (Signed)
Subjective:    Deborah House is a 31 y.o. female being seen today for her obstetrical visit. She is at [redacted]w[redacted]d gestation. This is a high risk pregnancy with anemia, morbid obesity, previous cesarean x2.  Patient reports that she had a home + COVID test about 2 weeks ago. Her sense of taste and smell decreased, but have now returned to normal. She was seen at the ER for IV hydration. She has not lost any weight since her last visit 4 weeks ago and is a net 10 pounds up since the beginning of pregnancy. Fetal movement: unsure.  Review of Systems:   Review of Systems   Objective:    BP 120/80   Wt 256 lb (116.1 kg)   LMP 07/17/2021 (Exact Date)   BMI 41.32 kg/m   Physical Exam  Exam Well nourished, well hydratedAfrican American female, no apparent distress She is ambulating and conversing normally. FHR- 150 on bedside ultrasound.  FHT:  150s BPM seen on bedside ultrasound    Assessment:    Pregnancy:  [id number]  Anemia in pregnancy- I rec'd OTC iron but she says that in the past she always throws up with OTC iron. She has used IV iron infusions in the past and would prefer that route.  H/o c/s x 2- she is interested in [facility]  Morbid obestity and elevated HBA1C- 1 hour glucola next week  She will come back for MD appt in 4 weeks/prn sooner  Plan:    Patient Active Problem List   Diagnosis Date Noted  . Supervision of high-risk pregnancy 10/04/2021  . Rh negative state in antepartum period 12/09/2018  . Anemia affecting pregnancy 05/15/2017  . Obesity affecting pregnancy 11/07/2016

## 2021-12-14 ENCOUNTER — Other Ambulatory Visit: Payer: BC Managed Care – PPO

## 2021-12-14 DIAGNOSIS — O99211 Obesity complicating pregnancy, first trimester: Secondary | ICD-10-CM | POA: Diagnosis not present

## 2021-12-14 DIAGNOSIS — O0991 Supervision of high risk pregnancy, unspecified, first trimester: Secondary | ICD-10-CM | POA: Diagnosis not present

## 2021-12-15 LAB — GLUCOSE TOLERANCE, 1 HOUR: Glucose, 1Hr PP: 109 mg/dL (ref 70–199)

## 2021-12-16 NOTE — Progress Notes (Unsigned)
Deborah House  [phone] [phone]  ID: Landis Martins OB: [birth year]  MR#: [id number]  EVO#:[id number]  Patient Care Team: Paulene Floor (Inactive) as PCP - General (Physician Assistant) Lloyd Huger, MD as Consulting Physician (Oncology)   CHIEF COMPLAINT: Iron deficiency anemia.  INTERVAL HISTORY: Patient agreed to video assisted telemedicine visit for further evaluation and discussion of her laboratory results.  She continues to complain of fatigue, but attributes this to working 3rd shift and taking care of her 23 and 42-year-old daughters ([place]).  She otherwise feels well. She has no neurologic complaints.  She denies any recent fevers or illnesses.  She has a good appetite and denies weight loss.  She has no chest pain, shortness of breath, cough, or hemoptysis.  She denies any nausea, vomiting, constipation, or diarrhea.  She has no melena or hematochezia.  She has no urinary complaints.  Patient offers no further specific complaints today.  REVIEW OF SYSTEMS:   Review of Systems  Constitutional:  Positive for malaise/fatigue. Negative for fever and weight loss.  Respiratory: Negative.  Negative for cough and shortness of breath.   Cardiovascular: Negative.  Negative for chest pain and leg swelling.  Gastrointestinal: Negative.  Negative for abdominal pain, blood in stool and melena.  Genitourinary: Negative.  Negative for hematuria.  Musculoskeletal: Negative.  Negative for back pain.  Skin: Negative.  Negative for rash.  Neurological: Negative.  Negative for dizziness, focal weakness, weakness and headaches.  Psychiatric/Behavioral: Negative.  The patient is not nervous/anxious.     As per HPI. Otherwise, a complete review of systems is negative.  PAST MEDICAL HISTORY: Past Medical History:  Diagnosis Date   Abdominal pain    Amenorrhea    Anemia    Anemia affecting pregnancy 05/15/2017   Hgb 8.0  at 28 weeks '[X]'  hematology referral   Chlamydia 2012   tx'd   Family history of ovarian cancer 07/2015   genetic testing letter sent; 4/22 PALB2 positive   Genetic testing of female 08/2020   MyRisk PALB2 positive, with 2 MSH3, POLE, and RNF43 VUS   Hemorrhoids    Low-lying placenta in third trimester    Missed abortion 06/01/2014   [facility]   Monoallelic mutation of PALB2 gene 08/2020   MyRisk; increased risk of breast/ovar/pancreatic cancer    PAST SURGICAL HISTORY: Past Surgical History:  Procedure Laterality Date   CESAREAN SECTION N/A 06/27/2017   Procedure: CESAREAN SECTION;  Surgeon: Malachy Mood, MD;  Location: [facility] ORS;  Service: Obstetrics;  Laterality: N/A;   CESAREAN SECTION N/A 07/09/2019   Procedure: CESAREAN SECTION;  Surgeon: Gae Dry, MD;  Location: [facility] ORS;  Service: Obstetrics;  Laterality: N/A;   DILATION AND CURETTAGE OF UTERUS  06/01/2014   EYE SURGERY Left    when an infant   MOUTH SURGERY Left 2017   root canal    FAMILY HISTORY: Family History  Problem Relation Age of Onset   Diabetes Maternal Grandmother    Ovarian cancer Maternal Grandmother 5   Anemia Mother    Anemia Sister     ADVANCED DIRECTIVES (Y/N):  N  HEALTH MAINTENANCE: Social History   Tobacco Use   Smoking status: Never   Smokeless tobacco: Never  Vaping Use   Vaping Use: Never used  Substance Use Topics   Alcohol use: No   Drug use: No     Colonoscopy:  PAP:  Bone density:  Lipid panel:  No Known Allergies  Current Outpatient Medications  Medication Sig Dispense Refill   Doxylamine-Pyridoxine ER (BONJESTA) 20-20 MG TBCR Take 1 tablet by mouth every 12 (twelve) hours as needed. (Patient not taking: Reported on 10/10/2021) 60 tablet 3   metoCLOPramide (REGLAN) 10 MG tablet Take 1 tablet (10 mg total) by mouth every 6 (six) hours as needed for nausea. 20 tablet 0   ondansetron (ZOFRAN-ODT) 4 MG disintegrating tablet Take 1 tablet (4 mg total) by mouth every 8  (eight) hours as needed for nausea or vomiting. 8 tablet 0   No current facility-administered medications for this visit.    OBJECTIVE: There were no vitals filed for this visit.   There is no height or weight on file to calculate BMI.    ECOG FS:0 - Asymptomatic  General: Well-developed, well-nourished, no acute distress. HEENT: Normocephalic. Neuro: Alert, answering all questions appropriately. Cranial nerves grossly intact. Psych: Normal affect.   LAB RESULTS:  Lab Results  Component Value Date   NA 136 11/24/2021   K 3.7 11/24/2021   CL 107 11/24/2021   CO2 20 (L) 11/24/2021   GLUCOSE 110 (H) 11/24/2021   BUN 5 (L) 11/24/2021   CREATININE 0.64 11/24/2021   CALCIUM 8.8 (L) 11/24/2021   PROT 7.5 11/24/2021   ALBUMIN 3.6 11/24/2021   AST 22 11/24/2021   ALT 17 11/24/2021   ALKPHOS 56 11/24/2021   BILITOT 0.9 11/24/2021   GFRNONAA >60 11/24/2021   GFRAA 104 09/23/2019    Lab Results  Component Value Date   WBC 10.3 11/24/2021   NEUTROABS 5.1 06/02/2020   HGB 10.9 (L) 11/24/2021   HCT 34.5 (L) 11/24/2021   MCV 76.2 (L) 11/24/2021   PLT 289 11/24/2021   Lab Results  Component Value Date   IRON 44 06/02/2020   TIBC 325 06/02/2020   IRONPCTSAT 14 06/02/2020    Lab Results  Component Value Date   FERRITIN 92 06/02/2020     STUDIES: No results found.  ASSESSMENT: Iron deficiency anemia.  PLAN:  1.  Iron deficiency anemia: Patient cannot tolerate oral iron supplementation.  Patient's hemoglobin and iron stores continue to be within normal limits.  She does not require additional IV Feraheme today. Patient last received treatment on Oct 16, 2019.  Return to clinic in 4 months with repeat laboratory work and video assisted telemedicine visit.  If her laboratory results continue to remain within normal limits, she could possibly be discharged to clinic.    2.  Decreased MCV: Resolved.  Hemoglobinopathy profile was previously reported as normal.   Patient  expressed understanding and was in agreement with this plan. She also understands that She can call clinic at any time with any questions, concerns, or complaints.    Lloyd Huger, MD   12/16/2021 7:56 AM

## 2021-12-19 ENCOUNTER — Ambulatory Visit: Payer: BC Managed Care – PPO | Admitting: Oncology

## 2021-12-19 ENCOUNTER — Other Ambulatory Visit: Payer: BC Managed Care – PPO

## 2021-12-20 ENCOUNTER — Other Ambulatory Visit: Payer: Self-pay

## 2021-12-20 DIAGNOSIS — D509 Iron deficiency anemia, unspecified: Secondary | ICD-10-CM

## 2021-12-21 ENCOUNTER — Inpatient Hospital Stay (HOSPITAL_BASED_OUTPATIENT_CLINIC_OR_DEPARTMENT_OTHER): Payer: BC Managed Care – PPO | Admitting: Oncology

## 2021-12-21 ENCOUNTER — Inpatient Hospital Stay: Payer: BC Managed Care – PPO | Attending: Oncology

## 2021-12-21 VITALS — BP 112/62 | HR 101 | Temp 97.7°F | Resp 18 | Wt 259.0 lb

## 2021-12-21 DIAGNOSIS — O99012 Anemia complicating pregnancy, second trimester: Secondary | ICD-10-CM | POA: Insufficient documentation

## 2021-12-21 DIAGNOSIS — D509 Iron deficiency anemia, unspecified: Secondary | ICD-10-CM | POA: Insufficient documentation

## 2021-12-21 LAB — CBC WITH DIFFERENTIAL/PLATELET
Abs Immature Granulocytes: 0.04 10*3/uL (ref 0.00–0.07)
Basophils Absolute: 0 10*3/uL (ref 0.0–0.1)
Basophils Relative: 0 %
Eosinophils Absolute: 0.1 10*3/uL (ref 0.0–0.5)
Eosinophils Relative: 1 %
HCT: 33.3 % — ABNORMAL LOW (ref 36.0–46.0)
Hemoglobin: 10.6 g/dL — ABNORMAL LOW (ref 12.0–15.0)
Immature Granulocytes: 0 %
Lymphocytes Relative: 26 %
Lymphs Abs: 3.2 10*3/uL (ref 0.7–4.0)
MCH: 24.5 pg — ABNORMAL LOW (ref 26.0–34.0)
MCHC: 31.8 g/dL (ref 30.0–36.0)
MCV: 77.1 fL — ABNORMAL LOW (ref 80.0–100.0)
Monocytes Absolute: 0.6 10*3/uL (ref 0.1–1.0)
Monocytes Relative: 5 %
Neutro Abs: 8.4 10*3/uL — ABNORMAL HIGH (ref 1.7–7.7)
Neutrophils Relative %: 68 %
Platelets: 324 10*3/uL (ref 150–400)
RBC: 4.32 MIL/uL (ref 3.87–5.11)
RDW: 15.1 % (ref 11.5–15.5)
WBC: 12.3 10*3/uL — ABNORMAL HIGH (ref 4.0–10.5)
nRBC: 0 % (ref 0.0–0.2)

## 2021-12-21 LAB — IRON AND TIBC
Iron: 44 ug/dL (ref 28–170)
Saturation Ratios: 9 % — ABNORMAL LOW (ref 10.4–31.8)
TIBC: 519 ug/dL — ABNORMAL HIGH (ref 250–450)
UIBC: 475 ug/dL

## 2021-12-21 LAB — FERRITIN: Ferritin: 10 ng/mL — ABNORMAL LOW (ref 11–307)

## 2021-12-21 NOTE — Progress Notes (Signed)
Patient here today for visit regarding anemia in pregnancy.

## 2021-12-26 ENCOUNTER — Inpatient Hospital Stay: Payer: BC Managed Care – PPO

## 2021-12-26 VITALS — BP 118/80 | HR 91 | Temp 96.8°F | Resp 18

## 2021-12-26 DIAGNOSIS — O99012 Anemia complicating pregnancy, second trimester: Secondary | ICD-10-CM

## 2021-12-26 DIAGNOSIS — O99013 Anemia complicating pregnancy, third trimester: Secondary | ICD-10-CM

## 2021-12-26 DIAGNOSIS — D509 Iron deficiency anemia, unspecified: Secondary | ICD-10-CM | POA: Diagnosis not present

## 2021-12-26 LAB — CBC
HCT: 31.6 % — ABNORMAL LOW (ref 36.0–46.0)
Hemoglobin: 10 g/dL — ABNORMAL LOW (ref 12.0–15.0)
MCH: 24.3 pg — ABNORMAL LOW (ref 26.0–34.0)
MCHC: 31.6 g/dL (ref 30.0–36.0)
MCV: 76.7 fL — ABNORMAL LOW (ref 80.0–100.0)
Platelets: 307 10*3/uL (ref 150–400)
RBC: 4.12 MIL/uL (ref 3.87–5.11)
RDW: 15.1 % (ref 11.5–15.5)
WBC: 12.9 10*3/uL — ABNORMAL HIGH (ref 4.0–10.5)
nRBC: 0 % (ref 0.0–0.2)

## 2021-12-26 LAB — FOLATE: Folate: 8.7 ng/mL (ref >5.9)

## 2021-12-26 LAB — IRON AND TIBC
Iron: 50 ug/dL (ref 28–170)
Saturation Ratios: 10 % — ABNORMAL LOW (ref 10.4–31.8)
TIBC: 518 ug/dL — ABNORMAL HIGH (ref 250–450)
UIBC: 468 ug/dL

## 2021-12-26 LAB — VITAMIN B12: Vitamin B-12: 153 pg/mL — ABNORMAL LOW (ref 180–914)

## 2021-12-26 LAB — FERRITIN: Ferritin: 10 ng/mL — ABNORMAL LOW (ref 11–307)

## 2021-12-26 MED ORDER — SODIUM CHLORIDE 0.9 % IV SOLN
Freq: Once | INTRAVENOUS | Status: AC
  Filled 2021-12-26: qty 250

## 2021-12-26 MED ORDER — SODIUM CHLORIDE 0.9 % IV SOLN
200.0000 mg | Freq: Once | INTRAVENOUS | Status: AC
  Administered 2021-12-26: 200 mg via INTRAVENOUS
  Filled 2021-12-26: qty 200

## 2021-12-26 NOTE — Patient Instructions (Signed)
[facility]  Discharge Instructions: Thank you for choosing [facility] to provide your oncology and hematology care.  If you have a lab appointment with the Cancer Center, please go directly to the Cancer Center and check in at the registration area.  Wear comfortable clothing and clothing appropriate for easy access to any Portacath or PICC line.   We strive to give you quality time with your provider. You may need to reschedule your appointment if you arrive late (15 or more minutes).  Arriving late affects you and other patients whose appointments are after yours.  Also, if you miss three or more appointments without notifying the office, you may be dismissed from the clinic at the provider's discretion.      For prescription refill requests, have your pharmacy contact our office and allow 72 hours for refills to be completed.    Today you received the following chemotherapy and/or immunotherapy agents VENOFER      To help prevent nausea and vomiting after your treatment, we encourage you to take your nausea medication as directed.  BELOW ARE SYMPTOMS THAT SHOULD BE REPORTED IMMEDIATELY: *FEVER GREATER THAN 100.4 F (38 C) OR HIGHER *CHILLS OR SWEATING *NAUSEA AND VOMITING THAT IS NOT CONTROLLED WITH YOUR NAUSEA MEDICATION *UNUSUAL SHORTNESS OF BREATH *UNUSUAL BRUISING OR BLEEDING *URINARY PROBLEMS (pain or burning when urinating, or frequent urination) *BOWEL PROBLEMS (unusual diarrhea, constipation, pain near the anus) TENDERNESS IN MOUTH AND THROAT WITH OR WITHOUT PRESENCE OF ULCERS (sore throat, sores in mouth, or a toothache) UNUSUAL RASH, SWELLING OR PAIN  UNUSUAL VAGINAL DISCHARGE OR ITCHING   Items with * indicate a potential emergency and should be followed up as soon as possible or go to the Emergency Department if any problems should occur.  Please show the CHEMOTHERAPY ALERT CARD or IMMUNOTHERAPY ALERT CARD at check-in to the  Emergency Department and triage nurse.  Should you have questions after your visit or need to cancel or reschedule your appointment, please contact [facility]  [phone] and follow the prompts.  Office hours are 8:00 a.m. to 4:30 p.m. Monday - Friday. Please note that voicemails left after 4:00 p.m. may not be returned until the following business day.  We are closed weekends and major holidays. You have access to a nurse at all times for urgent questions. Please call the main number to the clinic [phone] and follow the prompts.  For any non-urgent questions, you may also contact your provider using MyChart. We now offer e-Visits for anyone 18 and older to request care online for non-urgent symptoms. For details visit [email or web address].   Also download the MyChart app! Go to the app store, search "MyChart", open the app, select [facility], and log in with your MyChart username and password.  Masks are optional in the cancer centers. If you would like for your care team to wear a mask while they are taking care of you, please let them know. For doctor visits, patients may have with them one support person who is at least 33 years old. At this time, visitors are not allowed in the infusion area.  Iron Sucrose Injection What is this medication? IRON SUCROSE (EYE ern SOO krose) treats low levels of iron (iron deficiency anemia) in people with kidney disease. Iron is a mineral that plays an important role in making red blood cells, which carry oxygen from your lungs to the rest of your body. This medicine may be   used for other purposes; ask your health care provider or pharmacist if you have questions. COMMON BRAND NAME(S): Venofer What should I tell my care team before I take this medication? They need to know if you have any of these conditions: Anemia not caused by low iron levels Heart disease High levels of iron in the blood Kidney disease Liver  disease An unusual or allergic reaction to iron, other medications, foods, dyes, or preservatives Pregnant or trying to get pregnant Breast-feeding How should I use this medication? This medication is for infusion into a vein. It is given in a hospital or clinic setting. Talk to your care team about the use of this medication in children. While this medication may be prescribed for children as young as 2 years for selected conditions, precautions do apply. Overdosage: If you think you have taken too much of this medicine contact a poison control center or emergency room at once. NOTE: This medicine is only for you. Do not share this medicine with others. What if I miss a dose? It is important not to miss your dose. Call your care team if you are unable to keep an appointment. What may interact with this medication? Do not take this medication with any of the following: Deferoxamine Dimercaprol Other iron products This medication may also interact with the following: Chloramphenicol Deferasirox This list may not describe all possible interactions. Give your health care provider a list of all the medicines, herbs, non-prescription drugs, or dietary supplements you use. Also tell them if you smoke, drink alcohol, or use illegal drugs. Some items may interact with your medicine. What should I watch for while using this medication? Visit your care team regularly. Tell your care team if your symptoms do not start to get better or if they get worse. You may need blood work done while you are taking this medication. You may need to follow a special diet. Talk to your care team. Foods that contain iron include: whole grains/cereals, dried fruits, beans, or peas, leafy green vegetables, and organ meats (liver, kidney). What side effects may I notice from receiving this medication? Side effects that you should report to your care team as soon as possible: Allergic reactions--skin rash, itching, hives,  swelling of the face, lips, tongue, or throat Low blood pressure--dizziness, feeling faint or lightheaded, blurry vision Shortness of breath Side effects that usually do not require medical attention (report to your care team if they continue or are bothersome): Flushing Headache Joint pain Muscle pain Nausea Pain, redness, or irritation at injection site This list may not describe all possible side effects. Call your doctor for medical advice about side effects. You may report side effects to FDA at 1-800-FDA-1088. Where should I keep my medication? This medication is given in a hospital or clinic and will not be stored at home. NOTE: This sheet is a summary. It may not cover all possible information. If you have questions about this medicine, talk to your doctor, pharmacist, or health care provider.  2023 Elsevier/Gold Standard (2007-06-28 00:00:00)   

## 2022-01-02 ENCOUNTER — Inpatient Hospital Stay: Payer: BC Managed Care – PPO

## 2022-01-03 ENCOUNTER — Ambulatory Visit (INDEPENDENT_AMBULATORY_CARE_PROVIDER_SITE_OTHER): Payer: BC Managed Care – PPO | Admitting: Obstetrics & Gynecology

## 2022-01-03 ENCOUNTER — Ambulatory Visit
Admission: RE | Admit: 2022-01-03 | Discharge: 2022-01-03 | Disposition: A | Discharge status: Home / Self Care | Payer: BC Managed Care – PPO | Source: Ambulatory Visit | Attending: Obstetrics & Gynecology | Admitting: Obstetrics & Gynecology

## 2022-01-03 ENCOUNTER — Encounter: Payer: Self-pay | Admitting: Obstetrics & Gynecology

## 2022-01-03 VITALS — BP 112/70 | Wt 258.2 lb

## 2022-01-03 DIAGNOSIS — Z362 Encounter for other antenatal screening follow-up: Secondary | ICD-10-CM | POA: Diagnosis not present

## 2022-01-03 DIAGNOSIS — O368921 Maternal care for other specified fetal problems, second trimester, fetus 1: Secondary | ICD-10-CM | POA: Diagnosis not present

## 2022-01-03 DIAGNOSIS — Z3A18 18 weeks gestation of pregnancy: Secondary | ICD-10-CM | POA: Diagnosis not present

## 2022-01-03 LAB — POCT URINALYSIS DIPSTICK OB: Glucose, UA: NEGATIVE

## 2022-01-04 ENCOUNTER — Inpatient Hospital Stay: Payer: BC Managed Care – PPO

## 2022-01-04 VITALS — BP 133/82 | HR 98 | Temp 96.7°F | Resp 18

## 2022-01-04 DIAGNOSIS — O99012 Anemia complicating pregnancy, second trimester: Secondary | ICD-10-CM | POA: Diagnosis not present

## 2022-01-04 DIAGNOSIS — O99013 Anemia complicating pregnancy, third trimester: Secondary | ICD-10-CM

## 2022-01-04 DIAGNOSIS — D509 Iron deficiency anemia, unspecified: Secondary | ICD-10-CM | POA: Diagnosis not present

## 2022-01-04 MED ORDER — SODIUM CHLORIDE 0.9 % IV SOLN
200.0000 mg | Freq: Once | INTRAVENOUS | Status: AC
  Administered 2022-01-04: 200 mg via INTRAVENOUS
  Filled 2022-01-04: qty 200

## 2022-01-04 MED ORDER — SODIUM CHLORIDE 0.9 % IV SOLN
Freq: Once | INTRAVENOUS | Status: AC
  Filled 2022-01-04: qty 250

## 2022-01-04 NOTE — Patient Instructions (Signed)
[facility]  Discharge Instructions: Thank you for choosing [facility] to provide your oncology and hematology care.  If you have a lab appointment with the Cancer Center, please go directly to the Cancer Center and check in at the registration area.  Wear comfortable clothing and clothing appropriate for easy access to any Portacath or PICC line.   We strive to give you quality time with your provider. You may need to reschedule your appointment if you arrive late (15 or more minutes).  Arriving late affects you and other patients whose appointments are after yours.  Also, if you miss three or more appointments without notifying the office, you may be dismissed from the clinic at the provider's discretion.      For prescription refill requests, have your pharmacy contact our office and allow 72 hours for refills to be completed.    Today you received the following chemotherapy and/or immunotherapy agents VENOFER      To help prevent nausea and vomiting after your treatment, we encourage you to take your nausea medication as directed.  BELOW ARE SYMPTOMS THAT SHOULD BE REPORTED IMMEDIATELY: *FEVER GREATER THAN 100.4 F (38 C) OR HIGHER *CHILLS OR SWEATING *NAUSEA AND VOMITING THAT IS NOT CONTROLLED WITH YOUR NAUSEA MEDICATION *UNUSUAL SHORTNESS OF BREATH *UNUSUAL BRUISING OR BLEEDING *URINARY PROBLEMS (pain or burning when urinating, or frequent urination) *BOWEL PROBLEMS (unusual diarrhea, constipation, pain near the anus) TENDERNESS IN MOUTH AND THROAT WITH OR WITHOUT PRESENCE OF ULCERS (sore throat, sores in mouth, or a toothache) UNUSUAL RASH, SWELLING OR PAIN  UNUSUAL VAGINAL DISCHARGE OR ITCHING   Items with * indicate a potential emergency and should be followed up as soon as possible or go to the Emergency Department if any problems should occur.  Please show the CHEMOTHERAPY ALERT CARD or IMMUNOTHERAPY ALERT CARD at check-in to the  Emergency Department and triage nurse.  Should you have questions after your visit or need to cancel or reschedule your appointment, please contact [facility]  [phone] and follow the prompts.  Office hours are 8:00 a.m. to 4:30 p.m. Monday - Friday. Please note that voicemails left after 4:00 p.m. may not be returned until the following business day.  We are closed weekends and major holidays. You have access to a nurse at all times for urgent questions. Please call the main number to the clinic [phone] and follow the prompts.  For any non-urgent questions, you may also contact your provider using MyChart. We now offer e-Visits for anyone 18 and older to request care online for non-urgent symptoms. For details visit [email or web address].   Also download the MyChart app! Go to the app store, search "MyChart", open the app, select [facility], and log in with your MyChart username and password.  Masks are optional in the cancer centers. If you would like for your care team to wear a mask while they are taking care of you, please let them know. For doctor visits, patients may have with them one support person who is at least 33 years old. At this time, visitors are not allowed in the infusion area.  Iron Sucrose Injection What is this medication? IRON SUCROSE (EYE ern SOO krose) treats low levels of iron (iron deficiency anemia) in people with kidney disease. Iron is a mineral that plays an important role in making red blood cells, which carry oxygen from your lungs to the rest of your body. This medicine may be   used for other purposes; ask your health care provider or pharmacist if you have questions. COMMON BRAND NAME(S): Venofer What should I tell my care team before I take this medication? They need to know if you have any of these conditions: Anemia not caused by low iron levels Heart disease High levels of iron in the blood Kidney disease Liver  disease An unusual or allergic reaction to iron, other medications, foods, dyes, or preservatives Pregnant or trying to get pregnant Breast-feeding How should I use this medication? This medication is for infusion into a vein. It is given in a hospital or clinic setting. Talk to your care team about the use of this medication in children. While this medication may be prescribed for children as young as 2 years for selected conditions, precautions do apply. Overdosage: If you think you have taken too much of this medicine contact a poison control center or emergency room at once. NOTE: This medicine is only for you. Do not share this medicine with others. What if I miss a dose? It is important not to miss your dose. Call your care team if you are unable to keep an appointment. What may interact with this medication? Do not take this medication with any of the following: Deferoxamine Dimercaprol Other iron products This medication may also interact with the following: Chloramphenicol Deferasirox This list may not describe all possible interactions. Give your health care provider a list of all the medicines, herbs, non-prescription drugs, or dietary supplements you use. Also tell them if you smoke, drink alcohol, or use illegal drugs. Some items may interact with your medicine. What should I watch for while using this medication? Visit your care team regularly. Tell your care team if your symptoms do not start to get better or if they get worse. You may need blood work done while you are taking this medication. You may need to follow a special diet. Talk to your care team. Foods that contain iron include: whole grains/cereals, dried fruits, beans, or peas, leafy green vegetables, and organ meats (liver, kidney). What side effects may I notice from receiving this medication? Side effects that you should report to your care team as soon as possible: Allergic reactions--skin rash, itching, hives,  swelling of the face, lips, tongue, or throat Low blood pressure--dizziness, feeling faint or lightheaded, blurry vision Shortness of breath Side effects that usually do not require medical attention (report to your care team if they continue or are bothersome): Flushing Headache Joint pain Muscle pain Nausea Pain, redness, or irritation at injection site This list may not describe all possible side effects. Call your doctor for medical advice about side effects. You may report side effects to FDA at 1-800-FDA-1088. Where should I keep my medication? This medication is given in a hospital or clinic and will not be stored at home. NOTE: This sheet is a summary. It may not cover all possible information. If you have questions about this medicine, talk to your doctor, pharmacist, or health care provider.  2023 Elsevier/Gold Standard (2007-06-28 00:00:00)   

## 2022-01-08 MED FILL — Iron Sucrose Inj 20 MG/ML (Fe Equiv): INTRAVENOUS | Qty: 10 | Status: AC

## 2022-01-09 ENCOUNTER — Inpatient Hospital Stay: Payer: BC Managed Care – PPO

## 2022-01-11 ENCOUNTER — Ambulatory Visit: Payer: BC Managed Care – PPO

## 2022-01-12 ENCOUNTER — Inpatient Hospital Stay: Payer: BC Managed Care – PPO

## 2022-01-12 VITALS — BP 113/70 | HR 97 | Temp 97.8°F | Resp 16

## 2022-01-12 DIAGNOSIS — D509 Iron deficiency anemia, unspecified: Secondary | ICD-10-CM | POA: Diagnosis not present

## 2022-01-12 DIAGNOSIS — O99013 Anemia complicating pregnancy, third trimester: Secondary | ICD-10-CM

## 2022-01-12 DIAGNOSIS — O99012 Anemia complicating pregnancy, second trimester: Secondary | ICD-10-CM | POA: Diagnosis not present

## 2022-01-12 MED ORDER — SODIUM CHLORIDE 0.9 % IV SOLN
200.0000 mg | Freq: Once | INTRAVENOUS | Status: AC
  Administered 2022-01-12: 200 mg via INTRAVENOUS
  Filled 2022-01-12: qty 200

## 2022-01-12 MED ORDER — SODIUM CHLORIDE 0.9 % IV SOLN
Freq: Once | INTRAVENOUS | Status: AC
  Filled 2022-01-12: qty 250

## 2022-01-18 ENCOUNTER — Ambulatory Visit: Payer: BC Managed Care – PPO

## 2022-01-18 ENCOUNTER — Inpatient Hospital Stay: Payer: BC Managed Care – PPO

## 2022-01-18 VITALS — BP 120/78 | HR 100 | Temp 97.3°F | Resp 18

## 2022-01-18 DIAGNOSIS — D509 Iron deficiency anemia, unspecified: Secondary | ICD-10-CM | POA: Diagnosis not present

## 2022-01-18 DIAGNOSIS — O99012 Anemia complicating pregnancy, second trimester: Secondary | ICD-10-CM | POA: Diagnosis not present

## 2022-01-18 DIAGNOSIS — O99013 Anemia complicating pregnancy, third trimester: Secondary | ICD-10-CM

## 2022-01-18 MED ORDER — SODIUM CHLORIDE 0.9 % IV SOLN
200.0000 mg | Freq: Once | INTRAVENOUS | Status: AC
  Administered 2022-01-18: 200 mg via INTRAVENOUS
  Filled 2022-01-18: qty 200

## 2022-01-18 MED ORDER — SODIUM CHLORIDE 0.9 % IV SOLN
Freq: Once | INTRAVENOUS | Status: AC
  Filled 2022-01-18: qty 250

## 2022-01-18 NOTE — Patient Instructions (Signed)
[facility]  Discharge Instructions: Thank you for choosing [facility] to provide your oncology and hematology care.  If you have a lab appointment with the Cancer Center, please go directly to the Cancer Center and check in at the registration area.  Wear comfortable clothing and clothing appropriate for easy access to any Portacath or PICC line.   We strive to give you quality time with your provider. You may need to reschedule your appointment if you arrive late (15 or more minutes).  Arriving late affects you and other patients whose appointments are after yours.  Also, if you miss three or more appointments without notifying the office, you may be dismissed from the clinic at the provider's discretion.      For prescription refill requests, have your pharmacy contact our office and allow 72 hours for refills to be completed.    Today you received the following chemotherapy and/or immunotherapy agents VENOFER      To help prevent nausea and vomiting after your treatment, we encourage you to take your nausea medication as directed.  BELOW ARE SYMPTOMS THAT SHOULD BE REPORTED IMMEDIATELY: *FEVER GREATER THAN 100.4 F (38 C) OR HIGHER *CHILLS OR SWEATING *NAUSEA AND VOMITING THAT IS NOT CONTROLLED WITH YOUR NAUSEA MEDICATION *UNUSUAL SHORTNESS OF BREATH *UNUSUAL BRUISING OR BLEEDING *URINARY PROBLEMS (pain or burning when urinating, or frequent urination) *BOWEL PROBLEMS (unusual diarrhea, constipation, pain near the anus) TENDERNESS IN MOUTH AND THROAT WITH OR WITHOUT PRESENCE OF ULCERS (sore throat, sores in mouth, or a toothache) UNUSUAL RASH, SWELLING OR PAIN  UNUSUAL VAGINAL DISCHARGE OR ITCHING   Items with * indicate a potential emergency and should be followed up as soon as possible or go to the Emergency Department if any problems should occur.  Please show the CHEMOTHERAPY ALERT CARD or IMMUNOTHERAPY ALERT CARD at check-in to the  Emergency Department and triage nurse.  Should you have questions after your visit or need to cancel or reschedule your appointment, please contact [facility]  [phone] and follow the prompts.  Office hours are 8:00 a.m. to 4:30 p.m. Monday - Friday. Please note that voicemails left after 4:00 p.m. may not be returned until the following business day.  We are closed weekends and major holidays. You have access to a nurse at all times for urgent questions. Please call the main number to the clinic [phone] and follow the prompts.  For any non-urgent questions, you may also contact your provider using MyChart. We now offer e-Visits for anyone 18 and older to request care online for non-urgent symptoms. For details visit [email or web address].   Also download the MyChart app! Go to the app store, search "MyChart", open the app, select [facility], and log in with your MyChart username and password.  Masks are optional in the cancer centers. If you would like for your care team to wear a mask while they are taking care of you, please let them know. For doctor visits, patients may have with them one support person who is at least 33 years old. At this time, visitors are not allowed in the infusion area.  Iron Sucrose Injection What is this medication? IRON SUCROSE (EYE ern SOO krose) treats low levels of iron (iron deficiency anemia) in people with kidney disease. Iron is a mineral that plays an important role in making red blood cells, which carry oxygen from your lungs to the rest of your body. This medicine may be   used for other purposes; ask your health care provider or pharmacist if you have questions. COMMON BRAND NAME(S): Venofer What should I tell my care team before I take this medication? They need to know if you have any of these conditions: Anemia not caused by low iron levels Heart disease High levels of iron in the blood Kidney disease Liver  disease An unusual or allergic reaction to iron, other medications, foods, dyes, or preservatives Pregnant or trying to get pregnant Breast-feeding How should I use this medication? This medication is for infusion into a vein. It is given in a hospital or clinic setting. Talk to your care team about the use of this medication in children. While this medication may be prescribed for children as young as 2 years for selected conditions, precautions do apply. Overdosage: If you think you have taken too much of this medicine contact a poison control center or emergency room at once. NOTE: This medicine is only for you. Do not share this medicine with others. What if I miss a dose? It is important not to miss your dose. Call your care team if you are unable to keep an appointment. What may interact with this medication? Do not take this medication with any of the following: Deferoxamine Dimercaprol Other iron products This medication may also interact with the following: Chloramphenicol Deferasirox This list may not describe all possible interactions. Give your health care provider a list of all the medicines, herbs, non-prescription drugs, or dietary supplements you use. Also tell them if you smoke, drink alcohol, or use illegal drugs. Some items may interact with your medicine. What should I watch for while using this medication? Visit your care team regularly. Tell your care team if your symptoms do not start to get better or if they get worse. You may need blood work done while you are taking this medication. You may need to follow a special diet. Talk to your care team. Foods that contain iron include: whole grains/cereals, dried fruits, beans, or peas, leafy green vegetables, and organ meats (liver, kidney). What side effects may I notice from receiving this medication? Side effects that you should report to your care team as soon as possible: Allergic reactions--skin rash, itching, hives,  swelling of the face, lips, tongue, or throat Low blood pressure--dizziness, feeling faint or lightheaded, blurry vision Shortness of breath Side effects that usually do not require medical attention (report to your care team if they continue or are bothersome): Flushing Headache Joint pain Muscle pain Nausea Pain, redness, or irritation at injection site This list may not describe all possible side effects. Call your doctor for medical advice about side effects. You may report side effects to FDA at 1-800-FDA-1088. Where should I keep my medication? This medication is given in a hospital or clinic and will not be stored at home. NOTE: This sheet is a summary. It may not cover all possible information. If you have questions about this medicine, talk to your doctor, pharmacist, or health care provider.  2023 Elsevier/Gold Standard (2007-06-28 00:00:00)   

## 2022-01-27 ENCOUNTER — Encounter: Payer: Self-pay | Admitting: Obstetrics & Gynecology

## 2022-01-27 NOTE — Progress Notes (Signed)
  Subjective:    Deborah House is a 31 y.o. female [phone], being seen today for her obstetrical visit. She is at [redacted]w[redacted]d gestation. Patient reports no bleeding, no contractions, no cramping, and no leaking. Fetal movement: normal.  Menstrual History: OB History     Gravida  4   Para  2   Term  2   Preterm      AB  1   Living  2      SAB  1   IAB      Ectopic      Multiple  0   Live Births  2           Menarche age: 58 Patient's last menstrual period was 07/17/2021 (exact date).    The following portions of the patient's history were reviewed and updated as appropriate: allergies, current medications, past family history, past medical history, past social history, past surgical history, and problem list.  Review of Systems A comprehensive review of systems was negative.   Objective:     BP 112/70   Wt 258 lb 3.2 oz (117.1 kg)   LMP 07/17/2021 (Exact Date)   BMI 41.67 kg/m      Assessment:    Pregnancy 18 and 5/7 weeks   Plan:    Problem list reviewed and updated. Labs reviewed. Role of ultrasound in pregnancy discussed; fetal survey:  yes Follow up in 4 wks  Linzie Collin, MD  01/27/2022 11:00 AM

## 2022-01-31 ENCOUNTER — Inpatient Hospital Stay: Payer: BC Managed Care – PPO | Attending: Oncology

## 2022-01-31 ENCOUNTER — Ambulatory Visit (INDEPENDENT_AMBULATORY_CARE_PROVIDER_SITE_OTHER): Payer: BC Managed Care – PPO | Admitting: Obstetrics

## 2022-01-31 VITALS — BP 122/70 | Wt 260.0 lb

## 2022-01-31 DIAGNOSIS — O99012 Anemia complicating pregnancy, second trimester: Secondary | ICD-10-CM | POA: Insufficient documentation

## 2022-01-31 DIAGNOSIS — O99013 Anemia complicating pregnancy, third trimester: Secondary | ICD-10-CM

## 2022-01-31 DIAGNOSIS — D509 Iron deficiency anemia, unspecified: Secondary | ICD-10-CM | POA: Diagnosis not present

## 2022-01-31 DIAGNOSIS — O0991 Supervision of high risk pregnancy, unspecified, first trimester: Secondary | ICD-10-CM

## 2022-01-31 DIAGNOSIS — O99211 Obesity complicating pregnancy, first trimester: Secondary | ICD-10-CM

## 2022-01-31 MED ORDER — SODIUM CHLORIDE 0.9 % IV SOLN
Freq: Once | INTRAVENOUS | Status: AC
  Filled 2022-01-31: qty 250

## 2022-01-31 MED ORDER — SODIUM CHLORIDE 0.9 % IV SOLN
200.0000 mg | Freq: Once | INTRAVENOUS | Status: AC
  Administered 2022-01-31: 200 mg via INTRAVENOUS
  Filled 2022-01-31: qty 200

## 2022-01-31 NOTE — Progress Notes (Signed)
No vb. No lof.  

## 2022-01-31 NOTE — Progress Notes (Signed)
Routine Prenatal Care Visit  Subjective  Deborah House is a 31 y.o. [phone] at [redacted]w[redacted]d being seen today for ongoing prenatal care.  She is currently monitored for the following issues for this high-risk pregnancy and has Obesity affecting pregnancy; Anemia affecting pregnancy; Rh negative state in antepartum period; and Supervision of high-risk pregnancy on their problem list.  ----------------------------------------------------------------------------------- [name] has not had an anatomy scan- was not ordered by last provider.she has named her son Deborah House. Still very interested in a TOLAC. Will need visit with MDs to discuss. Contractions: Not present. Vag. Bleeding: None.   . Leaking Fluid denies.  ----------------------------------------------------------------------------------- The following portions of the patient's history were reviewed and updated as appropriate: allergies, current medications, past family history, past medical history, past social history, past surgical history and problem list. Problem list updated.  Objective  Blood pressure 122/70, weight 260 lb (117.9 kg), last menstrual period 07/17/2021. Pregravid weight 245 lb (111.1 kg) Total Weight Gain 15 lb (6.804 kg) Urinalysis: Urine Protein    Urine Glucose    Fetal Status:           General:  Alert, oriented and cooperative. Patient is in no acute distress.  Skin: Skin is warm and dry. No rash noted.   Cardiovascular: Normal heart rate noted  Respiratory: Normal respiratory effort, no problems with respiration noted  Abdomen: Soft, gravid, appropriate for gestational age. Pain/Pressure: Absent     Pelvic:  Cervical exam deferred        Extremities: Normal range of motion.     Mental Status: Normal mood and affect. Normal behavior. Normal judgment and thought content.   Assessment   31 y.o. [id number] at [redacted]w[redacted]d by  06/01/2022, by Ultrasound presenting for routine prenatal visit  Plan    fourth Problems (from 10/04/21 to present)    Problem Noted Resolved   Supervision of high-risk pregnancy 10/04/2021 by Loran Senters, CMA No   Overview Addendum 10/22/2021  9:34 PM by Mirna Mires, CNM     Nursing Staff Provider  Office Location  [facility] Dating  ? LMP, ordered  Language  English Anatomy US    Flu Vaccine   Genetic Screen  NIPS:   TDaP vaccine    Hgb A1C or  GTT Early : Third trimester :   Covid    LAB RESULTS   Rhogam   Blood Type   O negative  Feeding Plan  Antibody  neg  Contraception  Rubella  immune  Circumcision  RPR   NR  Pediatrician   HBsAg   neg  Support Person  HIV  neg  Prenatal Classes  Varicella immune    GBS  (For PCN allergy, check sensitivities)   BTL Consent     VBAC Consent  Pap      Hgb Electro      CF      SMA                   Preterm labor symptoms and general obstetric precautions including but not limited to vaginal bleeding, contractions, leaking of fluid and fetal movement were reviewed in detail with the patient. Please refer to After Visit Summary for other counseling recommendations. Will have her 28 wk labs at 26 weeks- next visit.  I went ahead and ordered her anatomy scan:) She measures larger for dates- will have MFM do the scans  No follow-ups on file.  Mirna Mires, CNM  01/31/2022 10:18 AM

## 2022-02-06 ENCOUNTER — Ambulatory Visit: Admission: RE | Admit: 2022-02-06 | Payer: BC Managed Care – PPO | Source: Ambulatory Visit

## 2022-02-08 ENCOUNTER — Other Ambulatory Visit: Payer: Self-pay | Admitting: *Deleted

## 2022-02-08 ENCOUNTER — Ambulatory Visit: Payer: BC Managed Care – PPO | Attending: Obstetrics

## 2022-02-08 ENCOUNTER — Ambulatory Visit: Payer: BC Managed Care – PPO | Admitting: *Deleted

## 2022-02-08 VITALS — BP 124/70 | HR 103

## 2022-02-08 DIAGNOSIS — O99211 Obesity complicating pregnancy, first trimester: Secondary | ICD-10-CM | POA: Diagnosis not present

## 2022-02-08 DIAGNOSIS — O0992 Supervision of high risk pregnancy, unspecified, second trimester: Secondary | ICD-10-CM

## 2022-02-08 DIAGNOSIS — O0991 Supervision of high risk pregnancy, unspecified, first trimester: Secondary | ICD-10-CM | POA: Insufficient documentation

## 2022-02-08 DIAGNOSIS — O99212 Obesity complicating pregnancy, second trimester: Secondary | ICD-10-CM

## 2022-02-08 DIAGNOSIS — O34219 Maternal care for unspecified type scar from previous cesarean delivery: Secondary | ICD-10-CM

## 2022-02-12 DIAGNOSIS — M5489 Other dorsalgia: Secondary | ICD-10-CM | POA: Diagnosis not present

## 2022-02-27 ENCOUNTER — Encounter: Payer: Self-pay | Admitting: Obstetrics

## 2022-02-28 ENCOUNTER — Other Ambulatory Visit: Payer: BC Managed Care – PPO

## 2022-03-01 ENCOUNTER — Ambulatory Visit (INDEPENDENT_AMBULATORY_CARE_PROVIDER_SITE_OTHER): Payer: BC Managed Care – PPO | Admitting: Obstetrics

## 2022-03-01 ENCOUNTER — Other Ambulatory Visit: Payer: BC Managed Care – PPO

## 2022-03-01 VITALS — BP 122/74 | Wt 261.0 lb

## 2022-03-01 DIAGNOSIS — Z3A26 26 weeks gestation of pregnancy: Secondary | ICD-10-CM

## 2022-03-01 DIAGNOSIS — O360130 Maternal care for anti-D [Rh] antibodies, third trimester, not applicable or unspecified: Secondary | ICD-10-CM | POA: Diagnosis not present

## 2022-03-01 DIAGNOSIS — D649 Anemia, unspecified: Secondary | ICD-10-CM

## 2022-03-01 DIAGNOSIS — O99211 Obesity complicating pregnancy, first trimester: Secondary | ICD-10-CM | POA: Diagnosis not present

## 2022-03-01 DIAGNOSIS — O0991 Supervision of high risk pregnancy, unspecified, first trimester: Secondary | ICD-10-CM

## 2022-03-01 DIAGNOSIS — O99012 Anemia complicating pregnancy, second trimester: Secondary | ICD-10-CM

## 2022-03-01 DIAGNOSIS — O99213 Obesity complicating pregnancy, third trimester: Secondary | ICD-10-CM | POA: Diagnosis not present

## 2022-03-01 DIAGNOSIS — E668 Other obesity: Secondary | ICD-10-CM | POA: Diagnosis not present

## 2022-03-01 DIAGNOSIS — Z3A36 36 weeks gestation of pregnancy: Secondary | ICD-10-CM | POA: Diagnosis not present

## 2022-03-01 DIAGNOSIS — O34219 Maternal care for unspecified type scar from previous cesarean delivery: Secondary | ICD-10-CM | POA: Diagnosis not present

## 2022-03-01 DIAGNOSIS — O0990 Supervision of high risk pregnancy, unspecified, unspecified trimester: Secondary | ICD-10-CM

## 2022-03-01 DIAGNOSIS — O360120 Maternal care for anti-D [Rh] antibodies, second trimester, not applicable or unspecified: Secondary | ICD-10-CM

## 2022-03-01 DIAGNOSIS — Z23 Encounter for immunization: Secondary | ICD-10-CM | POA: Diagnosis not present

## 2022-03-01 LAB — POCT URINALYSIS DIPSTICK
Glucose, UA: NEGATIVE
Nitrite, UA: NEGATIVE
Protein, UA: NEGATIVE
Spec Grav, UA: 1.015 (ref 1.010–1.025)
pH, UA: 6.5 (ref 5.0–8.0)

## 2022-03-01 NOTE — Progress Notes (Signed)
28 week labs today. No vb. No lof. Needs refill on nausea medication

## 2022-03-01 NOTE — Progress Notes (Signed)
Routine Prenatal Care Visit  Subjective  Deborah House is a 31 y.o. [phone] at [redacted]w[redacted]d being seen today for ongoing prenatal care.  She is currently monitored for the following issues for this high-risk pregnancy and has Obesity affecting pregnancy; Anemia affecting pregnancy; Rh negative state in antepartum period; and Supervision of high-risk pregnancy on their problem list.  ----------------------------------------------------------------------------------- [name] is seeing MFM for monthly sono exams due to her body habitus. She now desires TOLAC, and knows to see one of the MDs for consultation.having her 28 week labs today. Contractions: Not present. Vag. Bleeding: None.  Movement: Present. Leaking Fluid denies.  ----------------------------------------------------------------------------------- The following portions of the patient's history were reviewed and updated as appropriate: allergies, current medications, past family history, past medical history, past social history, past surgical history and problem list. Problem list updated.  Objective  Blood pressure 122/74, weight 118.4 kg, last menstrual period 07/17/2021. Pregravid weight 111.1 kg Total Weight Gain 7.258 kg Urinalysis: Urine Protein    Urine Glucose    Fetal Status:     Movement: Present     General:  Alert, oriented and cooperative. Patient is in no acute distress.  Skin: Skin is warm and dry. No rash noted.   Cardiovascular: Normal heart rate noted  Respiratory: Normal respiratory effort, no problems with respiration noted  Abdomen: Soft, gravid, appropriate for gestational age. Pain/Pressure: Absent     Pelvic:  Cervical exam deferred        Extremities: Normal range of motion.     Mental Status: Normal mood and affect. Normal behavior. Normal judgment and thought content.   Assessment   31 y.o. [id number] at [redacted]w[redacted]d by  06/01/2022, by Ultrasound presenting for routine prenatal  visit  Plan   fourth Problems (from 10/04/21 to present)    Problem Noted Resolved   Supervision of high-risk pregnancy 10/04/2021 by Cleophas Dunker, CMA No   Overview Addendum 02/08/2022 11:43 PM by Imagene Riches, CNM     Nursing Staff Provider  Office Location  [facility] Dating  ? LMP, ordered  Language  English Anatomy US  Normal female  Flu Vaccine   Genetic Screen  NIPS:   TDaP vaccine    Hgb A1C or  GTT Early : Third trimester :   Covid    LAB RESULTS   Rhogam   Blood Type   O negative  Feeding Plan  Antibody  neg  Contraception  Rubella  immune  Circumcision  RPR   NR  Pediatrician   HBsAg   neg  Support Person  HIV  neg  Prenatal Classes  Varicella immune    GBS  (For PCN allergy, check sensitivities)   BTL Consent     VBAC Consent  Pap      Hgb Electro      CF      SMA                   Preterm labor symptoms and general obstetric precautions including but not limited to vaginal bleeding, contractions, leaking of fluid and fetal movement were reviewed in detail with the patient. Please refer to After Visit Summary for other counseling recommendations.   Return in about 2 weeks (around 03/15/2022) for return OB.  Imagene Riches, CNM  03/01/2022 10:03 AM

## 2022-03-01 NOTE — Addendum Note (Signed)
Addended by: Brien Few on: 03/01/2022 10:12 AM   Modules accepted: Orders

## 2022-03-02 LAB — 28 WEEK RH+PANEL
Basophils Absolute: 0 10*3/uL (ref 0.0–0.2)
Basos: 0 %
EOS (ABSOLUTE): 0.1 10*3/uL (ref 0.0–0.4)
Eos: 1 %
Gestational Diabetes Screen: 109 mg/dL (ref 70–139)
HIV Screen 4th Generation wRfx: NONREACTIVE
Hematocrit: 33.8 % — ABNORMAL LOW (ref 34.0–46.6)
Hemoglobin: 11.1 g/dL (ref 11.1–15.9)
Immature Grans (Abs): 0 10*3/uL (ref 0.0–0.1)
Immature Granulocytes: 1 %
Lymphocytes Absolute: 2.3 10*3/uL (ref 0.7–3.1)
Lymphs: 26 %
MCH: 26.7 pg (ref 26.6–33.0)
MCHC: 32.8 g/dL (ref 31.5–35.7)
MCV: 81 fL (ref 79–97)
Monocytes Absolute: 0.5 10*3/uL (ref 0.1–0.9)
Monocytes: 5 %
Neutrophils Absolute: 6 10*3/uL (ref 1.4–7.0)
Neutrophils: 67 %
Platelets: 268 10*3/uL (ref 150–450)
RBC: 4.16 x10E6/uL (ref 3.77–5.28)
RDW: 15.8 % — ABNORMAL HIGH (ref 11.7–15.4)
RPR Ser Ql: NONREACTIVE
WBC: 8.8 10*3/uL (ref 3.4–10.8)

## 2022-03-08 ENCOUNTER — Encounter: Payer: Self-pay | Admitting: Obstetrics

## 2022-03-08 DIAGNOSIS — R112 Nausea with vomiting, unspecified: Secondary | ICD-10-CM

## 2022-03-09 ENCOUNTER — Ambulatory Visit: Payer: BC Managed Care – PPO | Admitting: *Deleted

## 2022-03-09 ENCOUNTER — Other Ambulatory Visit: Payer: Self-pay | Admitting: *Deleted

## 2022-03-09 ENCOUNTER — Ambulatory Visit: Payer: BC Managed Care – PPO | Attending: Maternal & Fetal Medicine

## 2022-03-09 ENCOUNTER — Other Ambulatory Visit: Payer: Self-pay | Admitting: Advanced Practice Midwife

## 2022-03-09 ENCOUNTER — Other Ambulatory Visit: Payer: Self-pay | Admitting: Obstetrics and Gynecology

## 2022-03-09 VITALS — BP 116/60 | HR 82

## 2022-03-09 DIAGNOSIS — O34219 Maternal care for unspecified type scar from previous cesarean delivery: Secondary | ICD-10-CM

## 2022-03-09 DIAGNOSIS — O360193 Maternal care for anti-D [Rh] antibodies, unspecified trimester, fetus 3: Secondary | ICD-10-CM

## 2022-03-09 DIAGNOSIS — E669 Obesity, unspecified: Secondary | ICD-10-CM | POA: Diagnosis not present

## 2022-03-09 DIAGNOSIS — O0993 Supervision of high risk pregnancy, unspecified, third trimester: Secondary | ICD-10-CM | POA: Insufficient documentation

## 2022-03-09 DIAGNOSIS — O219 Vomiting of pregnancy, unspecified: Secondary | ICD-10-CM

## 2022-03-09 DIAGNOSIS — Z3A28 28 weeks gestation of pregnancy: Secondary | ICD-10-CM

## 2022-03-09 DIAGNOSIS — O99213 Obesity complicating pregnancy, third trimester: Secondary | ICD-10-CM | POA: Diagnosis not present

## 2022-03-09 DIAGNOSIS — R112 Nausea with vomiting, unspecified: Secondary | ICD-10-CM

## 2022-03-09 DIAGNOSIS — O99212 Obesity complicating pregnancy, second trimester: Secondary | ICD-10-CM | POA: Insufficient documentation

## 2022-03-09 MED ORDER — DOXYLAMINE-PYRIDOXINE 10-10 MG PO TBEC: 2.0000 | DELAYED_RELEASE_TABLET | Freq: Four times a day (QID) | ORAL | 1 refills | Status: DC | PRN

## 2022-03-09 MED ORDER — BONJESTA 20-20 MG PO TBCR: 20.0000 mg | EXTENDED_RELEASE_TABLET | Freq: Two times a day (BID) | ORAL | 0 refills | Status: DC

## 2022-03-09 NOTE — Telephone Encounter (Signed)
Pt called triage requesting RF on her nausea medication. RF sent, pt aware.

## 2022-03-12 ENCOUNTER — Telehealth: Payer: Self-pay

## 2022-03-12 ENCOUNTER — Encounter: Payer: Self-pay | Admitting: Obstetrics

## 2022-03-12 DIAGNOSIS — R112 Nausea with vomiting, unspecified: Secondary | ICD-10-CM

## 2022-03-12 MED ORDER — DOXYLAMINE-PYRIDOXINE 10-10 MG PO TBEC: 2.0000 | DELAYED_RELEASE_TABLET | Freq: Four times a day (QID) | ORAL | 5 refills | Status: AC | PRN

## 2022-03-12 NOTE — Telephone Encounter (Signed)
Patient called to say that her Doxylamine-Pyridoxine was called in incorrectly. She said she is usually prescribed a quantity of 100 with 5 refills, because she has nausea and vomiting her entire pregnancy and would like it sent in the say way. Medication was sent. Patient aware.

## 2022-03-15 ENCOUNTER — Encounter: Payer: BC Managed Care – PPO | Admitting: Obstetrics and Gynecology

## 2022-03-20 ENCOUNTER — Encounter: Payer: Self-pay | Admitting: Obstetrics and Gynecology

## 2022-03-20 ENCOUNTER — Ambulatory Visit (INDEPENDENT_AMBULATORY_CARE_PROVIDER_SITE_OTHER): Payer: BC Managed Care – PPO | Admitting: Obstetrics and Gynecology

## 2022-03-20 VITALS — BP 123/61 | HR 107 | Wt 266.2 lb

## 2022-03-20 DIAGNOSIS — Z98891 History of uterine scar from previous surgery: Secondary | ICD-10-CM

## 2022-03-20 DIAGNOSIS — Z3A29 29 weeks gestation of pregnancy: Secondary | ICD-10-CM

## 2022-03-20 DIAGNOSIS — Z23 Encounter for immunization: Secondary | ICD-10-CM

## 2022-03-20 DIAGNOSIS — O360130 Maternal care for anti-D [Rh] antibodies, third trimester, not applicable or unspecified: Secondary | ICD-10-CM

## 2022-03-20 DIAGNOSIS — O34219 Maternal care for unspecified type scar from previous cesarean delivery: Secondary | ICD-10-CM

## 2022-03-20 DIAGNOSIS — O0993 Supervision of high risk pregnancy, unspecified, third trimester: Secondary | ICD-10-CM

## 2022-03-20 DIAGNOSIS — O26893 Other specified pregnancy related conditions, third trimester: Secondary | ICD-10-CM

## 2022-03-20 LAB — POCT URINALYSIS DIPSTICK OB
Bilirubin, UA: NEGATIVE
Blood, UA: NEGATIVE
Glucose, UA: NEGATIVE
Ketones, UA: NEGATIVE
Leukocytes, UA: NEGATIVE
Nitrite, UA: NEGATIVE
Spec Grav, UA: 1.015 (ref 1.010–1.025)
Urobilinogen, UA: 0.2 E.U./dL
pH, UA: 7 (ref 5.0–8.0)

## 2022-03-20 MED ORDER — RHO D IMMUNE GLOBULIN 1500 UNIT/2ML IJ SOSY
300.0000 ug | PREFILLED_SYRINGE | Freq: Once | INTRAMUSCULAR | Status: AC
  Administered 2022-03-20: 300 ug via INTRAMUSCULAR

## 2022-03-20 NOTE — Progress Notes (Signed)
ROB: Reports significant heartburn x 2 weeks, worse when lying down. Stopped drinking seltzer and sodas, now taking Tums without relief.  Recommend Pepcid OTC. For 28 week labs today.  Plans to breastfeed, desires IUD Paragard vs BTL  for contraception (leaning more towards IUD at this time). For Tdap today, signed blood consent.  Rhogam administered. Pediatrician is Philis Pique.   Patient also with h/o C/S x 2 (first one for previa, second was elective). Desires TOLAC for this pregnancy. Counseled regarding TOLAC vs RCS; risks/benefits discussed in detail. All questions answered.  Patient elects for TOLAC, consent signed 03/20/2022. VBAC calculated score is 45.1%. Has next growth scan in 2 weeks. RTC for OB appt in 2 weeks.

## 2022-03-20 NOTE — Progress Notes (Signed)
ROB [redacted]w[redacted]d: She is doing well, she has been having some severe heartburn. She thought is was because of the sodas and seltzer water she was drinking, but she stopped those and heartburn continues. Heartburn is worse when she is laying down. She has tried treating symptoms with TUMS with no relief.

## 2022-03-21 ENCOUNTER — Encounter: Payer: Self-pay | Admitting: Obstetrics and Gynecology

## 2022-03-30 ENCOUNTER — Encounter: Payer: Self-pay | Admitting: Obstetrics and Gynecology

## 2022-04-02 NOTE — Telephone Encounter (Signed)
Spoke with patient to clarify

## 2022-04-03 ENCOUNTER — Ambulatory Visit (INDEPENDENT_AMBULATORY_CARE_PROVIDER_SITE_OTHER): Payer: BC Managed Care – PPO | Admitting: Obstetrics and Gynecology

## 2022-04-03 ENCOUNTER — Encounter: Payer: Self-pay | Admitting: Obstetrics and Gynecology

## 2022-04-03 ENCOUNTER — Ambulatory Visit: Payer: BC Managed Care – PPO | Attending: Obstetrics and Gynecology

## 2022-04-03 ENCOUNTER — Ambulatory Visit: Payer: BC Managed Care – PPO | Admitting: *Deleted

## 2022-04-03 VITALS — BP 130/69 | HR 93

## 2022-04-03 VITALS — BP 109/74 | HR 83 | Wt 265.7 lb

## 2022-04-03 DIAGNOSIS — E669 Obesity, unspecified: Secondary | ICD-10-CM | POA: Diagnosis not present

## 2022-04-03 DIAGNOSIS — O0993 Supervision of high risk pregnancy, unspecified, third trimester: Secondary | ICD-10-CM

## 2022-04-03 DIAGNOSIS — Z3A31 31 weeks gestation of pregnancy: Secondary | ICD-10-CM

## 2022-04-03 DIAGNOSIS — O34219 Maternal care for unspecified type scar from previous cesarean delivery: Secondary | ICD-10-CM | POA: Diagnosis not present

## 2022-04-03 DIAGNOSIS — O99213 Obesity complicating pregnancy, third trimester: Secondary | ICD-10-CM

## 2022-04-03 DIAGNOSIS — O360130 Maternal care for anti-D [Rh] antibodies, third trimester, not applicable or unspecified: Secondary | ICD-10-CM | POA: Diagnosis not present

## 2022-04-03 LAB — POCT URINALYSIS DIPSTICK OB
Bilirubin, UA: NEGATIVE
Blood, UA: NEGATIVE
Glucose, UA: NEGATIVE
Ketones, UA: NEGATIVE
Leukocytes, UA: NEGATIVE
Spec Grav, UA: 1.02 (ref 1.010–1.025)
Urobilinogen, UA: 0.2 E.U./dL
pH, UA: 7 (ref 5.0–8.0)

## 2022-04-03 NOTE — Progress Notes (Signed)
ROB. Patient states daily fetal movement with lots of pelvic pressure. Growth scan performed today. Patient states no questions or concerns at this time.

## 2022-04-03 NOTE — Progress Notes (Signed)
ROB: Reports daily movement.  Confirmed her desire for TOLAC.  She says she is not taking her prenatal vitamins.  Strongly encouraged to take them.  Growth ultrasound completed today.

## 2022-04-04 ENCOUNTER — Encounter: Payer: Self-pay | Admitting: Obstetrics

## 2022-04-16 ENCOUNTER — Ambulatory Visit (INDEPENDENT_AMBULATORY_CARE_PROVIDER_SITE_OTHER): Payer: BC Managed Care – PPO | Admitting: Medical

## 2022-04-16 ENCOUNTER — Encounter: Payer: Self-pay | Admitting: Medical

## 2022-04-16 VITALS — BP 120/80 | Wt 267.0 lb

## 2022-04-16 DIAGNOSIS — E668 Other obesity: Secondary | ICD-10-CM

## 2022-04-16 DIAGNOSIS — O99013 Anemia complicating pregnancy, third trimester: Secondary | ICD-10-CM

## 2022-04-16 DIAGNOSIS — O26899 Other specified pregnancy related conditions, unspecified trimester: Secondary | ICD-10-CM

## 2022-04-16 DIAGNOSIS — Z3A33 33 weeks gestation of pregnancy: Secondary | ICD-10-CM

## 2022-04-16 DIAGNOSIS — D649 Anemia, unspecified: Secondary | ICD-10-CM

## 2022-04-16 DIAGNOSIS — E6609 Other obesity due to excess calories: Secondary | ICD-10-CM

## 2022-04-16 DIAGNOSIS — O34219 Maternal care for unspecified type scar from previous cesarean delivery: Secondary | ICD-10-CM

## 2022-04-16 DIAGNOSIS — O0993 Supervision of high risk pregnancy, unspecified, third trimester: Secondary | ICD-10-CM

## 2022-04-16 DIAGNOSIS — Z98891 History of uterine scar from previous surgery: Secondary | ICD-10-CM

## 2022-04-16 DIAGNOSIS — O99213 Obesity complicating pregnancy, third trimester: Secondary | ICD-10-CM

## 2022-04-16 DIAGNOSIS — O360130 Maternal care for anti-D [Rh] antibodies, third trimester, not applicable or unspecified: Secondary | ICD-10-CM

## 2022-04-16 NOTE — Progress Notes (Signed)
   PRENATAL VISIT NOTE  Subjective:  Deborah House is a 31 y.o. [phone] at [redacted]w[redacted]d being seen today for ongoing prenatal care.  She is currently monitored for the following issues for this high-risk pregnancy and has Obesity affecting pregnancy; Anemia affecting pregnancy; Rh negative state in antepartum period; History of 2 cesarean sections; Supervision of high-risk pregnancy; and Desires VBAC (vaginal birth after cesarean) trial on their problem list.  Patient reports occasional contractions.  Contractions: Not present. Vag. Bleeding: None.  Movement: Present. Denies leaking of fluid.   The following portions of the patient's history were reviewed and updated as appropriate: allergies, current medications, past family history, past medical history, past social history, past surgical history and problem list.   Objective:   Vitals:   04/16/22 1554  BP: 120/80  Weight: 267 lb (121.1 kg)    Fetal Status:     Movement: Present     General:  Alert, oriented and cooperative. Patient is in no acute distress.  Skin: Skin is warm and dry. No rash noted.   Cardiovascular: Normal heart rate noted  Respiratory: Normal respiratory effort, no problems with respiration noted  Abdomen: Soft, gravid, appropriate for gestational age.  Pain/Pressure: Present     Pelvic: Cervical exam deferred        Extremities: Normal range of motion.     Mental Status: Normal mood and affect. Normal behavior. Normal judgment and thought content.   Assessment and Plan:  Pregnancy: [id number] at [redacted]w[redacted]d 1. Supervision of high risk pregnancy in third trimester  2. History of 2 cesarean sections - Desires VBAC, discussed with MD at last visit   3. Desires VBAC (vaginal birth after cesarean) trial  4. Anemia affecting pregnancy in third trimester - Hematology appt 12/6  5. Rh negative state in antepartum period - Had Rhogam at 28 weeks   6. Other obesity affecting pregnancy in third trimester - MFM  follow-up scheduled 11/28 and 12/6  7. [redacted] weeks gestation of pregnancy   Preterm labor symptoms and general obstetric precautions including but not limited to vaginal bleeding, contractions, leaking of fluid and fetal movement were reviewed in detail with the patient. Please refer to After Visit Summary for other counseling recommendations.   Return in about 2 weeks (around 04/30/2022) for [facility] MD only.  Future Appointments  Date Time Provider Department Center  04/17/2022  8:30 AM [facility] NURSE [facility]  04/17/2022  8:45 AM [facility] US6 [facility]  04/25/2022 11:15 AM [facility] NURSE [facility]  04/25/2022 11:30 AM [facility] US2 [facility]  04/25/2022  1:15 PM [facility] LAB [facility] None  04/26/2022  2:45 PM [facility] INFUSION CHAIR 5 [facility] None  04/26/2022  3:00 PM Alinda Dooms, NP [facility] None    Vonzella Nipple, PA-C

## 2022-04-17 ENCOUNTER — Ambulatory Visit: Payer: BC Managed Care – PPO

## 2022-04-17 ENCOUNTER — Other Ambulatory Visit: Payer: Self-pay

## 2022-04-17 ENCOUNTER — Ambulatory Visit: Payer: BC Managed Care – PPO | Attending: Obstetrics and Gynecology

## 2022-04-17 VITALS — BP 125/69 | HR 101

## 2022-04-17 DIAGNOSIS — E669 Obesity, unspecified: Secondary | ICD-10-CM | POA: Diagnosis not present

## 2022-04-17 DIAGNOSIS — O361130 Maternal care for Anti-A sensitization, third trimester, not applicable or unspecified: Secondary | ICD-10-CM

## 2022-04-17 DIAGNOSIS — O0993 Supervision of high risk pregnancy, unspecified, third trimester: Secondary | ICD-10-CM | POA: Diagnosis not present

## 2022-04-17 DIAGNOSIS — O34219 Maternal care for unspecified type scar from previous cesarean delivery: Secondary | ICD-10-CM

## 2022-04-17 DIAGNOSIS — O99213 Obesity complicating pregnancy, third trimester: Secondary | ICD-10-CM | POA: Insufficient documentation

## 2022-04-17 DIAGNOSIS — Z3A33 33 weeks gestation of pregnancy: Secondary | ICD-10-CM

## 2022-04-17 DIAGNOSIS — Z3689 Encounter for other specified antenatal screening: Secondary | ICD-10-CM

## 2022-04-23 ENCOUNTER — Other Ambulatory Visit: Payer: BC Managed Care – PPO

## 2022-04-24 ENCOUNTER — Ambulatory Visit: Payer: BC Managed Care – PPO | Admitting: Oncology

## 2022-04-24 ENCOUNTER — Ambulatory Visit: Payer: BC Managed Care – PPO

## 2022-04-25 ENCOUNTER — Ambulatory Visit: Payer: BC Managed Care – PPO | Attending: Obstetrics and Gynecology

## 2022-04-25 ENCOUNTER — Other Ambulatory Visit: Payer: BC Managed Care – PPO

## 2022-04-25 ENCOUNTER — Ambulatory Visit: Payer: BC Managed Care – PPO | Admitting: *Deleted

## 2022-04-25 ENCOUNTER — Ambulatory Visit: Payer: BC Managed Care – PPO

## 2022-04-25 ENCOUNTER — Inpatient Hospital Stay: Payer: BC Managed Care – PPO | Attending: Oncology

## 2022-04-25 VITALS — BP 109/54 | HR 93

## 2022-04-25 DIAGNOSIS — O99013 Anemia complicating pregnancy, third trimester: Secondary | ICD-10-CM | POA: Diagnosis not present

## 2022-04-25 DIAGNOSIS — O0993 Supervision of high risk pregnancy, unspecified, third trimester: Secondary | ICD-10-CM | POA: Diagnosis not present

## 2022-04-25 DIAGNOSIS — D509 Iron deficiency anemia, unspecified: Secondary | ICD-10-CM | POA: Insufficient documentation

## 2022-04-25 DIAGNOSIS — O34219 Maternal care for unspecified type scar from previous cesarean delivery: Secondary | ICD-10-CM

## 2022-04-25 DIAGNOSIS — Z1502 Genetic susceptibility to malignant neoplasm of ovary: Secondary | ICD-10-CM | POA: Diagnosis not present

## 2022-04-25 DIAGNOSIS — O99012 Anemia complicating pregnancy, second trimester: Secondary | ICD-10-CM

## 2022-04-25 DIAGNOSIS — E669 Obesity, unspecified: Secondary | ICD-10-CM

## 2022-04-25 DIAGNOSIS — O99213 Obesity complicating pregnancy, third trimester: Secondary | ICD-10-CM | POA: Insufficient documentation

## 2022-04-25 DIAGNOSIS — O360130 Maternal care for anti-D [Rh] antibodies, third trimester, not applicable or unspecified: Secondary | ICD-10-CM | POA: Diagnosis not present

## 2022-04-25 DIAGNOSIS — Z1509 Genetic susceptibility to other malignant neoplasm: Secondary | ICD-10-CM | POA: Diagnosis not present

## 2022-04-25 DIAGNOSIS — Z3A34 34 weeks gestation of pregnancy: Secondary | ICD-10-CM

## 2022-04-25 LAB — CBC
HCT: 34.7 % — ABNORMAL LOW (ref 36.0–46.0)
Hemoglobin: 11.4 g/dL — ABNORMAL LOW (ref 12.0–15.0)
MCH: 26.8 pg (ref 26.0–34.0)
MCHC: 32.9 g/dL (ref 30.0–36.0)
MCV: 81.5 fL (ref 80.0–100.0)
Platelets: 257 10*3/uL (ref 150–400)
RBC: 4.26 MIL/uL (ref 3.87–5.11)
RDW: 14.6 % (ref 11.5–15.5)
WBC: 10.3 10*3/uL (ref 4.0–10.5)
nRBC: 0 % (ref 0.0–0.2)

## 2022-04-25 LAB — IRON AND TIBC
Iron: 70 ug/dL (ref 28–170)
Saturation Ratios: 15 % (ref 10.4–31.8)
TIBC: 468 ug/dL — ABNORMAL HIGH (ref 250–450)
UIBC: 398 ug/dL

## 2022-04-25 LAB — FERRITIN: Ferritin: 15 ng/mL (ref 11–307)

## 2022-04-26 ENCOUNTER — Inpatient Hospital Stay: Payer: BC Managed Care – PPO

## 2022-04-26 ENCOUNTER — Inpatient Hospital Stay (HOSPITAL_BASED_OUTPATIENT_CLINIC_OR_DEPARTMENT_OTHER): Payer: BC Managed Care – PPO | Admitting: Nurse Practitioner

## 2022-04-26 ENCOUNTER — Ambulatory Visit: Payer: BC Managed Care – PPO | Admitting: Oncology

## 2022-04-26 ENCOUNTER — Encounter: Payer: Self-pay | Admitting: Nurse Practitioner

## 2022-04-26 VITALS — BP 142/81 | HR 103 | Temp 97.2°F | Wt 264.0 lb

## 2022-04-26 DIAGNOSIS — D509 Iron deficiency anemia, unspecified: Secondary | ICD-10-CM | POA: Diagnosis not present

## 2022-04-26 DIAGNOSIS — Z1501 Genetic susceptibility to malignant neoplasm of breast: Secondary | ICD-10-CM | POA: Diagnosis not present

## 2022-04-26 DIAGNOSIS — O99013 Anemia complicating pregnancy, third trimester: Secondary | ICD-10-CM | POA: Diagnosis not present

## 2022-04-26 DIAGNOSIS — Z1509 Genetic susceptibility to other malignant neoplasm: Secondary | ICD-10-CM | POA: Diagnosis not present

## 2022-04-26 DIAGNOSIS — Z3A33 33 weeks gestation of pregnancy: Secondary | ICD-10-CM | POA: Diagnosis not present

## 2022-04-26 DIAGNOSIS — Z1589 Genetic susceptibility to other disease: Secondary | ICD-10-CM

## 2022-04-26 DIAGNOSIS — Z1502 Genetic susceptibility to malignant neoplasm of ovary: Secondary | ICD-10-CM | POA: Diagnosis not present

## 2022-04-26 MED ORDER — SODIUM CHLORIDE 0.9 % IV SOLN
INTRAVENOUS | Status: DC
  Filled 2022-04-26: qty 250

## 2022-04-26 MED ORDER — SODIUM CHLORIDE 0.9 % IV SOLN
200.0000 mg | Freq: Once | INTRAVENOUS | Status: AC
  Administered 2022-04-26: 200 mg via INTRAVENOUS
  Filled 2022-04-26: qty 200

## 2022-04-26 NOTE — Progress Notes (Signed)
[place]  [phone] [phone]  ID: Deborah House OB: 1990/08/31  MR#: [id number]  ELT#:[id number]  Patient Care Team: Deborah House (Inactive) as PCP - General (Physician Assistant) Deborah House, CNM as PCP - OBGYN (Obstetrics) Deborah Huger, MD as Consulting Physician (Oncology)   CHIEF COMPLAINT: Iron deficiency anemia in pregnancy.  INTERVAL HISTORY: Patient with history of anemia during pregnancy, now 67w3dreturns to clinic for follow up and consideration of iv iron. She feels tired and short of breath with exertion. Is hoping for vaginal delivery. Has had c sections in the past. Denies bleeding. Otherwise feels at baseline and denies other complaints.    REVIEW OF SYSTEMS:   Review of Systems  Constitutional:  Positive for malaise/fatigue. Negative for fever and weight loss.  Respiratory:  Positive for shortness of breath. Negative for cough.   Cardiovascular: Negative.  Negative for chest pain and leg swelling.  Gastrointestinal: Negative.  Negative for abdominal pain, blood in stool and melena.  Genitourinary: Negative.  Negative for hematuria.  Musculoskeletal: Negative.  Negative for back pain.  Skin: Negative.  Negative for rash.  Neurological: Negative.  Negative for dizziness, focal weakness, weakness and headaches.  Psychiatric/Behavioral: Negative.  The patient is not nervous/anxious.   As per HPI. Otherwise, a complete review of systems is negative.  PAST MEDICAL HISTORY: Past Medical History:  Diagnosis Date   Abdominal pain    Amenorrhea    Anemia    Anemia affecting pregnancy 05/15/2017   Hgb 8.0 at 28 weeks _0  hematology referral   Chlamydia 2012   tx'd   Family history of ovarian cancer 07/2015   genetic testing letter sent; 4/22 PALB2 positive   Genetic testing of female 08/2020   MyRisk PALB2 positive, with 2 MSH3, POLE, and RNF43 VUS   Hemorrhoids    Low-lying placenta in  third trimester    Missed abortion 06/01/2014   [facility]   Monoallelic mutation of PALB2 gene 08/2020   MyRisk; increased risk of breast/ovar/pancreatic cancer    PAST SURGICAL HISTORY: Past Surgical History:  Procedure Laterality Date   CESAREAN SECTION N/A 06/27/2017   Procedure: CESAREAN SECTION;  Surgeon: SMalachy Mood MD;  Location: [facility] ORS;  Service: Obstetrics;  Laterality: N/A;   CESAREAN SECTION N/A 07/09/2019   Procedure: CESAREAN SECTION;  Surgeon: HGae Dry MD;  Location: [facility] ORS;  Service: Obstetrics;  Laterality: N/A;   DILATION AND CURETTAGE OF UTERUS  06/01/2014   EYE SURGERY Left    when an infant   MOUTH SURGERY Left 2017   root canal    FAMILY HISTORY: Family History  Problem Relation Age of Onset   Anemia Mother    Anemia Sister    Cancer Maternal Grandmother    Diabetes Maternal Grandmother    Ovarian cancer Maternal Grandmother 614  Asthma Neg Hx    Heart disease Neg Hx    Hypertension Neg Hx    Stroke Neg Hx     ADVANCED DIRECTIVES (Y/N):  N  HEALTH MAINTENANCE: Social History   Tobacco Use   Smoking status: Never   Smokeless tobacco: Never  Vaping Use   Vaping Use: Never used  Substance Use Topics   Alcohol use: No   Drug use: No     Colonoscopy:  PAP:  Bone density:  Lipid panel:  No Known Allergies   Current Outpatient Medications  Medication Sig Dispense Refill   Doxylamine-Pyridoxine (DICLEGIS PO) Take by mouth.  No current facility-administered medications for this visit.    OBJECTIVE: Vitals:   04/26/22 1440  BP: (!) 142/81  Pulse: (!) 103  Temp: (!) 97.2 F (36.2 C)  SpO2: 100%     Body mass index is 42.61 kg/m.    ECOG FS:0 - Asymptomatic  General: Well-developed, well-nourished, no acute distress. Eyes: Pink conjunctiva, anicteric sclera. HEENT: Normocephalic, moist mucous membranes. Lungs: No audible wheezing or coughing. Heart: Regular rate and rhythm. Abdomen: gravid Musculoskeletal: No  edema, cyanosis, or clubbing. Neuro: Alert, answering all questions appropriately. Cranial nerves grossly intact. Skin: No rashes or petechiae noted. Psych: Normal affect.   LAB RESULTS: Lab Results  Component Value Date   WBC 10.3 04/25/2022   NEUTROABS 6.0 03/01/2022   HGB 11.4 (L) 04/25/2022   HCT 34.7 (L) 04/25/2022   MCV 81.5 04/25/2022   PLT 257 04/25/2022   Lab Results  Component Value Date   IRON 70 04/25/2022   TIBC 468 (H) 04/25/2022   IRONPCTSAT 15 04/25/2022   Lab Results  Component Value Date   FERRITIN 15 04/25/2022    STUDIES: [place] MFM FETAL BPP WO NON STRESS  Result Date: 04/25/2022 ----------------------------------------------------------------------  OBSTETRICS REPORT                       (Signed Final 04/25/2022 11:57 am) ---------------------------------------------------------------------- Patient Info  ID #:       [id number]                          D.O.B.:  12-26-1990 (31 yrs)  Name:       Deborah House                  Visit Date: 04/25/2022 11:11 am              [name] ---------------------------------------------------------------------- Performed By  Attending:        Johnell Comings MD         Referred By:      Jola Babinski OB/GYN  Performed By:     Stephenie Acres        [facility]                    BS RDMS                                  [facility] at                                                             [place] for                                                             Women ---------------------------------------------------------------------- Orders  #  Description                           Code  Ordered By  1  [place] MFM FETAL BPP WO NON               [id number]    RAVI [facility]     STRESS ----------------------------------------------------------------------  #  Order #                     Accession #                Episode #  1  [id number]                   [id number]                 [id number]  ---------------------------------------------------------------------- Indications  Obesity complicating pregnancy, third          O99.213  trimester (40)  Rh negative state in antepartum                O36.0190  History of cesarean delivery, currently        O34.219  pregnant (x2)  Negative MaterniT21  [redacted] weeks gestation of pregnancy                Z3A.34 ---------------------------------------------------------------------- Fetal Evaluation  Num Of Fetuses:         1  Fetal Heart Rate(bpm):  134  Cardiac Activity:       Observed  Presentation:           Cephalic  Placenta:               Anterior  P. Cord Insertion:      Previously Visualized  Amniotic Fluid  AFI FV:      Within normal limits  AFI Sum(cm)     %Tile       Largest Pocket(cm)  16.4            60          4.5  RUQ(cm)       RLQ(cm)       LUQ(cm)        LLQ(cm)  4.2           4.5           4.2            3.5 ---------------------------------------------------------------------- Biophysical Evaluation  Amniotic F.V:   Within normal limits       F. Tone:        Observed  F. Movement:    Observed                   Score:          8/8  F. Breathing:   Observed ---------------------------------------------------------------------- Biometry  LV:        4.3  mm ---------------------------------------------------------------------- OB History  Blood Type:   O-  Gravidity:    4         Term:   2         SAB:   1  Living:       2 ---------------------------------------------------------------------- Gestational Age  LMP:           40w 2d        Date:  07/17/21                  EDD:   04/23/22  Best:          34w 5d     Det. By:  Loman Chroman  EDD:   06/01/22                                      (10/12/21) ---------------------------------------------------------------------- Anatomy  Cranium:               Appears normal         Stomach:                Appears normal, left                                                                        sided   Diaphragm:             Appears normal         Bladder:                Appears normal ---------------------------------------------------------------------- Comments  This patient was seen for a BPP due to maternal obesity with  a BMI of 40.  She denies any problems since her last exam.  A biophysical profile performed today was 8/8.  The AFI was 16.4 cm (within normal limits).  She will return in 1 week for another BPP and growth scan. ----------------------------------------------------------------------                   Johnell Comings, MD Electronically Signed Final Report   04/25/2022 11:57 am ----------------------------------------------------------------------  [place] MFM FETAL BPP WO NON STRESS  Result Date: 04/17/2022 ----------------------------------------------------------------------  OBSTETRICS REPORT                       (Signed Final 04/17/2022 09:45 am) ---------------------------------------------------------------------- Patient Info  ID #:       [id number]                          D.O.B.:  09/17/1990 (31 yrs)  Name:       Deborah House                  Visit Date: 04/17/2022 09:20 am              [place] ---------------------------------------------------------------------- Performed By  Attending:        Sander Nephew      Referred By:      Jola Babinski OB/GYN                    MD  Performed By:     Jeanene Erb BS,      Location:         [facility]                    RDMS                                     [facility]                                                             [facility] ---------------------------------------------------------------------- Orders  #  Description                           Code        Ordered By  1  [place] MFM FETAL BPP WO NON               [id number]    RAVI [facility]     STRESS ----------------------------------------------------------------------  #  Order #                     Accession #                 Episode #  1  [id number]                   [id number]                 [id number] ---------------------------------------------------------------------- Indications  Obesity complicating pregnancy, third          O99.213  trimester (57)  Rh negative state in antepartum                O36.0190  History of cesarean delivery, currently        O34.219  pregnant (x2)  [redacted] weeks gestation of pregnancy                Z3A.33  Negative MaterniT21 ---------------------------------------------------------------------- Vital Signs  BP:          125/69 ---------------------------------------------------------------------- Fetal Evaluation  Num Of Fetuses:         1  Fetal Heart Rate(bpm):  140  Cardiac Activity:       Observed  Presentation:           Cephalic  Placenta:               Anterior  P. Cord Insertion:      Previously Visualized  Amniotic Fluid  AFI FV:      Within normal limits  AFI Sum(cm)     %Tile       Largest Pocket(cm)  15.2            54          6.5  RUQ(cm)       RLQ(cm)       LUQ(cm)        LLQ(cm)  3.2           2.4           6.5            3.1 ---------------------------------------------------------------------- Biophysical Evaluation  Amniotic F.V:   Pocket => 2 cm             F. Tone:        Observed  F. Movement:    Observed                   Score:          8/8  F. Breathing:   Observed ---------------------------------------------------------------------- OB History  Blood Type:   O-  Gravidity:  4         Term:   2         SAB:   1  Living:       2 ---------------------------------------------------------------------- Gestational Age  LMP:           46w 1d        Date:  07/17/21                  EDD:   04/23/22  Best:          33w 4d     Det. ByLoman Chroman         EDD:   06/01/22                                      (10/12/21) ---------------------------------------------------------------------- Anatomy  Diaphragm:             Appears normal         Bladder:                Appears  normal  Stomach:               Appears normal, left                         sided ---------------------------------------------------------------------- Impression  Antenatal testing performed given maternal elevated BMI  The biophysical profile was 8/8 with good fetal movement and  amniotic fluid volume. ---------------------------------------------------------------------- Recommendations  Continue weekly testing with serial growth as previously  scheduled ----------------------------------------------------------------------               Sander Nephew, MD Electronically Signed Final Report   04/17/2022 09:45 am ----------------------------------------------------------------------  [place] MFM OB FOLLOW UP  Result Date: 04/03/2022 ----------------------------------------------------------------------  OBSTETRICS REPORT                    (Corrected Final 04/03/2022 09:21 am) ---------------------------------------------------------------------- Patient Info  ID #:       [id number]                          D.O.B.:  01/24/1991 (31 yrs)  Name:       Deborah House                  Visit Date: 04/03/2022 09:09 am              [place] ---------------------------------------------------------------------- Performed By  Attending:        Valeda Malm DO       Referred By:      Jola Babinski OB/GYN  Performed By:     Eveline Keto         Location[facility]                    RDMS                                     [facility]                                                             [facility] ---------------------------------------------------------------------- Orders  #  Description                           Code        Ordered By  1  [place] MFM OB FOLLOW UP                   [phone]    [place] ----------------------------------------------------------------------  #  Order #                     Accession #                 Episode #  1  [id number]                   [id number]                 [id number] ---------------------------------------------------------------------- Indications  Obesity complicating pregnancy, third          O99.213  trimester (32)  [redacted] weeks gestation of pregnancy                Z3A.31  Rh negative state in antepartum                O36.0190  History of cesarean delivery, currently        O76.219  pregnant (x2)  Encounter for other antenatal screening        Z36.2  follow-up  Negative MaterniT21 ---------------------------------------------------------------------- Vital Signs  BP:          130/69 ---------------------------------------------------------------------- Fetal Evaluation  Num Of Fetuses:         1  Fetal Heart Rate(bpm):  131  Cardiac Activity:       Observed  Presentation:           Cephalic  Placenta:               Anterior  P. Cord Insertion:      Previously Visualized  Amniotic Fluid  AFI FV:      Within normal limits  AFI Sum(cm)     %Tile       Largest Pocket(cm)  11.4            26          3.2  RUQ(cm)       RLQ(cm)       LUQ(cm)        LLQ(cm)  2.4           3.1           3.2            2.7 ---------------------------------------------------------------------- Biometry  BPD:      76.2  mm     G. Age:  30w 4d         14  %    CI:        70.68   %    70 - 86  FL/HC:      20.7   %    19.1 - 21.3  HC:      288.9  mm     G. Age:  31w 6d         19  %    HC/AC:      0.99        0.96 - 1.17  AC:      291.7  mm     G. Age:  33w 1d         88  %    FL/BPD:     78.5   %    71 - 87  FL:       59.8  mm     G. Age:  31w 1d         25  %    FL/AC:      20.5   %    20 - 24  Est. FW:    1923  gm      4 lb 4 oz     60  % ---------------------------------------------------------------------- OB History  Blood Type:   O-  Gravidity:    4         Term:   2         SAB:   1  Living:       2  ---------------------------------------------------------------------- Gestational Age  LMP:           37w 1d        Date:  07/17/21                  EDD:   04/23/22  U/S Today:     31w 5d                                        EDD:   05/31/22  Best:          31w 4d     Det. By:  Loman Chroman         EDD:   06/01/22                                      (10/12/21) ---------------------------------------------------------------------- Anatomy  Cranium:               Appears normal         Abdomen:                Appears normal  Heart:                 Appears normal         Kidneys:                Appear normal                         (4CH, axis, and                         situs)  Diaphragm:             Appears normal         Bladder:                Appears normal  Stomach:  Appears normal, left   Spine:                  Appears normal                         sided ---------------------------------------------------------------------- Cervix Uterus Adnexa  Cervix  Not visualized (advanced GA >24wks)  Uterus  No abnormality visualized.  Right Ovary  Not visualized.  Left Ovary  Not visualized. ---------------------------------------------------------------------- Comments  Follow-up growth ultrasound at 31w 4d with EDD of  06/01/2022 dated by [facility]  (10/12/21). Pregnancy  complicated by obesity and prior CD.  Sonographic findings  Single intrauterine pregnancy at 31w 4d.  Observed fetal cardiac activity.  Cephalic presentation.  Interval fetal anatomy appears normal.  Fetal biometry shows the estimated fetal weight at the 60  percentile.  Amniotic fluid volume: Within normal limits. AFI: 11.4 cm.  MVP: 3.2 cm.  Placenta: Anterior.  Recommendations  1. Serial growth ultrasounds every 4 weeks until delivery  2. Antenatal testing to start on 11/28 and is scheduled  3. Delivery per typical OB indications ----------------------------------------------------------------------                        Valeda Malm, DO Electronically Signed Corrected Final Report  04/03/2022 09:21 am ----------------------------------------------------------------------   ASSESSMENT: Iron deficiency anemia in pregnancy  PLAN:  1.  Iron deficiency anemia in pregnancy: Unable to tolerate oral iron d/t side effects. Now s/p venofer x 5, last 01/31/22. Hemoglobin 11.4. Ferritin 15. Improved but persistently low. TIBC remains elevated. Iron sat 15%. Proceed with venofer x 4 given increased demand as she nears end of pregnancy, upcoming delivery. She will receive first dose of venofer today. Will plan to see her back approximately 3 months after delivery with repeat labs prior.   2.  Pregnancy: Patient reports her due date for her son is June 01, 2022. Follow up with [facility].   3. PALB2 mutation- heterozygous. VUS in MSH3, MSH3, POLE, RNF43. Reviewed increased absolute risk of breast cancer. Recommend annual mammogram and breast MRI with and without contrast. Normally start at age 42. Can consider RRM. Also increased risk of ovarian cancer. Can consider RRSO at age 53-50. Increased risk of pancreatic cancer. Recommend screening if family hx of pancreatic cancer. Consider cascade testing as well.   Disposition: Venofer x 4 4 mo- labs (cbc, ferritin, iron studies) Day later- Dr Grayland Ormond or APP, +/- venofer- la  I spent a total of 30 minutes reviewing chart data, face-to-face evaluation with the patient, counseling and coordination of care as detailed above.  Patient expressed understanding and was in agreement with this plan. She also understands that She can call clinic at any time with any questions, concerns, or complaints.   Verlon Au, NP   04/26/2022

## 2022-04-26 NOTE — Patient Instructions (Signed)

## 2022-05-01 ENCOUNTER — Ambulatory Visit (INDEPENDENT_AMBULATORY_CARE_PROVIDER_SITE_OTHER): Payer: BC Managed Care – PPO | Admitting: Obstetrics and Gynecology

## 2022-05-01 ENCOUNTER — Ambulatory Visit: Payer: BC Managed Care – PPO | Admitting: *Deleted

## 2022-05-01 ENCOUNTER — Ambulatory Visit: Payer: BC Managed Care – PPO | Attending: Maternal & Fetal Medicine

## 2022-05-01 ENCOUNTER — Encounter: Payer: Self-pay | Admitting: Obstetrics and Gynecology

## 2022-05-01 VITALS — BP 118/73 | HR 91 | Wt 265.8 lb

## 2022-05-01 VITALS — BP 123/49 | HR 95

## 2022-05-01 DIAGNOSIS — O34219 Maternal care for unspecified type scar from previous cesarean delivery: Secondary | ICD-10-CM | POA: Diagnosis not present

## 2022-05-01 DIAGNOSIS — Z3A35 35 weeks gestation of pregnancy: Secondary | ICD-10-CM

## 2022-05-01 DIAGNOSIS — E668 Other obesity: Secondary | ICD-10-CM

## 2022-05-01 DIAGNOSIS — O3663X0 Maternal care for excessive fetal growth, third trimester, not applicable or unspecified: Secondary | ICD-10-CM

## 2022-05-01 DIAGNOSIS — O360130 Maternal care for anti-D [Rh] antibodies, third trimester, not applicable or unspecified: Secondary | ICD-10-CM | POA: Diagnosis not present

## 2022-05-01 DIAGNOSIS — O99213 Obesity complicating pregnancy, third trimester: Secondary | ICD-10-CM | POA: Diagnosis not present

## 2022-05-01 DIAGNOSIS — O0993 Supervision of high risk pregnancy, unspecified, third trimester: Secondary | ICD-10-CM | POA: Insufficient documentation

## 2022-05-01 DIAGNOSIS — Z3689 Encounter for other specified antenatal screening: Secondary | ICD-10-CM | POA: Diagnosis not present

## 2022-05-01 DIAGNOSIS — E669 Obesity, unspecified: Secondary | ICD-10-CM

## 2022-05-01 NOTE — Progress Notes (Signed)
ROB: Patient still considering TOLAC.  For cesarean was for placenta previa, second was elective repeat.  She underwent ultrasound for fetal growth today because of elevated BMI.  Baby is at [employer].  Had a frank and open discussion regarding risks of unsuccessful TOLAC with a very large baby.  Also discussed the possible increased stress on the uterus for labor and delivery of a very large baby.  Patient understands the risks and plans to speak with her partner and inform us of her decision next week. Cultures next week.

## 2022-05-01 NOTE — Progress Notes (Signed)
ROB. Patient states daily fetal movement. Growth ultrasound and BPP completed this morning.  She states no questions or concerns at this time.

## 2022-05-04 ENCOUNTER — Inpatient Hospital Stay: Payer: BC Managed Care – PPO

## 2022-05-04 VITALS — BP 102/56 | HR 97 | Temp 97.0°F | Resp 18

## 2022-05-04 DIAGNOSIS — Z1509 Genetic susceptibility to other malignant neoplasm: Secondary | ICD-10-CM | POA: Diagnosis not present

## 2022-05-04 DIAGNOSIS — D509 Iron deficiency anemia, unspecified: Secondary | ICD-10-CM | POA: Diagnosis not present

## 2022-05-04 DIAGNOSIS — O99013 Anemia complicating pregnancy, third trimester: Secondary | ICD-10-CM

## 2022-05-04 DIAGNOSIS — Z1502 Genetic susceptibility to malignant neoplasm of ovary: Secondary | ICD-10-CM | POA: Diagnosis not present

## 2022-05-04 MED ORDER — SODIUM CHLORIDE 0.9 % IV SOLN
INTRAVENOUS | Status: DC
  Filled 2022-05-04: qty 250

## 2022-05-04 MED ORDER — SODIUM CHLORIDE 0.9 % IV SOLN
200.0000 mg | Freq: Once | INTRAVENOUS | Status: AC
  Administered 2022-05-04: 200 mg via INTRAVENOUS
  Filled 2022-05-04: qty 200

## 2022-05-08 ENCOUNTER — Ambulatory Visit: Payer: BC Managed Care – PPO

## 2022-05-08 ENCOUNTER — Other Ambulatory Visit: Payer: BC Managed Care – PPO

## 2022-05-09 ENCOUNTER — Ambulatory Visit (HOSPITAL_BASED_OUTPATIENT_CLINIC_OR_DEPARTMENT_OTHER): Payer: BC Managed Care – PPO

## 2022-05-09 ENCOUNTER — Ambulatory Visit: Payer: BC Managed Care – PPO | Attending: Maternal & Fetal Medicine | Admitting: *Deleted

## 2022-05-09 ENCOUNTER — Encounter: Payer: BC Managed Care – PPO | Admitting: Obstetrics and Gynecology

## 2022-05-09 VITALS — BP 114/54 | HR 86

## 2022-05-09 DIAGNOSIS — O360130 Maternal care for anti-D [Rh] antibodies, third trimester, not applicable or unspecified: Secondary | ICD-10-CM | POA: Diagnosis not present

## 2022-05-09 DIAGNOSIS — O3663X0 Maternal care for excessive fetal growth, third trimester, not applicable or unspecified: Secondary | ICD-10-CM | POA: Insufficient documentation

## 2022-05-09 DIAGNOSIS — O34219 Maternal care for unspecified type scar from previous cesarean delivery: Secondary | ICD-10-CM | POA: Diagnosis not present

## 2022-05-09 DIAGNOSIS — Z3689 Encounter for other specified antenatal screening: Secondary | ICD-10-CM | POA: Diagnosis not present

## 2022-05-09 DIAGNOSIS — E669 Obesity, unspecified: Secondary | ICD-10-CM

## 2022-05-09 DIAGNOSIS — O99213 Obesity complicating pregnancy, third trimester: Secondary | ICD-10-CM | POA: Insufficient documentation

## 2022-05-09 DIAGNOSIS — Z3A36 36 weeks gestation of pregnancy: Secondary | ICD-10-CM | POA: Diagnosis not present

## 2022-05-09 DIAGNOSIS — O0993 Supervision of high risk pregnancy, unspecified, third trimester: Secondary | ICD-10-CM

## 2022-05-09 DIAGNOSIS — O3660X0 Maternal care for excessive fetal growth, unspecified trimester, not applicable or unspecified: Secondary | ICD-10-CM

## 2022-05-10 ENCOUNTER — Ambulatory Visit (INDEPENDENT_AMBULATORY_CARE_PROVIDER_SITE_OTHER): Payer: BC Managed Care – PPO | Admitting: Obstetrics and Gynecology

## 2022-05-10 ENCOUNTER — Other Ambulatory Visit (HOSPITAL_COMMUNITY)
Admission: RE | Admit: 2022-05-10 | Discharge: 2022-05-10 | Disposition: A | Discharge status: Home / Self Care | Payer: BC Managed Care – PPO | Source: Ambulatory Visit | Attending: Obstetrics and Gynecology | Admitting: Obstetrics and Gynecology

## 2022-05-10 VITALS — BP 122/65 | HR 101 | Wt 263.1 lb

## 2022-05-10 DIAGNOSIS — Z3685 Encounter for antenatal screening for Streptococcus B: Secondary | ICD-10-CM

## 2022-05-10 DIAGNOSIS — Z113 Encounter for screening for infections with a predominantly sexual mode of transmission: Secondary | ICD-10-CM | POA: Diagnosis not present

## 2022-05-10 DIAGNOSIS — E668 Other obesity: Secondary | ICD-10-CM

## 2022-05-10 DIAGNOSIS — O0993 Supervision of high risk pregnancy, unspecified, third trimester: Secondary | ICD-10-CM | POA: Diagnosis not present

## 2022-05-10 DIAGNOSIS — O99213 Obesity complicating pregnancy, third trimester: Secondary | ICD-10-CM

## 2022-05-10 DIAGNOSIS — Z3A00 Weeks of gestation of pregnancy not specified: Secondary | ICD-10-CM | POA: Insufficient documentation

## 2022-05-10 DIAGNOSIS — Z98891 History of uterine scar from previous surgery: Secondary | ICD-10-CM

## 2022-05-10 DIAGNOSIS — O34219 Maternal care for unspecified type scar from previous cesarean delivery: Secondary | ICD-10-CM

## 2022-05-10 DIAGNOSIS — Z3A36 36 weeks gestation of pregnancy: Secondary | ICD-10-CM

## 2022-05-10 LAB — POCT URINALYSIS DIPSTICK OB
Bilirubin, UA: NEGATIVE
Glucose, UA: NEGATIVE
Leukocytes, UA: NEGATIVE
Nitrite, UA: NEGATIVE
Spec Grav, UA: 1.02 (ref 1.010–1.025)
Urobilinogen, UA: 0.2 E.U./dL
pH, UA: 6.5 (ref 5.0–8.0)

## 2022-05-10 MED FILL — Iron Sucrose Inj 20 MG/ML (Fe Equiv): INTRAVENOUS | Qty: 10 | Status: AC

## 2022-05-10 NOTE — Progress Notes (Signed)
ROB: Deborah House is a 31 y.o. [phone] female at 50w6 who presents for routine OB visit. Notes feeling tired. Also thinks that her mucus plug came out yerstday, but desires to be tested for yeast to make sure of no other issues. Has had a white thick discharge, but denies itching/burning. Vaginal swab and GBS collected. Reviewed labor precautions.  Patient still desiring TOLAC, despite large gestational size.  Had BPP with MFM yesterday. RTC in 1 week.

## 2022-05-10 NOTE — Progress Notes (Signed)
ROB 36.6w: She is doing well. She had a little back pain that has resolved. She thinks she lost her mucous plug yesterday, but wants to be swabbed for possible yeast infection as well. Has normal thick white discharge.

## 2022-05-11 ENCOUNTER — Inpatient Hospital Stay: Payer: BC Managed Care – PPO

## 2022-05-12 LAB — STREP GP B NAA: Strep Gp B NAA: NEGATIVE

## 2022-05-15 LAB — CERVICOVAGINAL ANCILLARY ONLY
Bacterial Vaginitis (gardnerella): NEGATIVE
Chlamydia: NEGATIVE
Comment: NEGATIVE
Comment: NEGATIVE
Comment: NORMAL
Neisseria Gonorrhea: NEGATIVE

## 2022-05-16 ENCOUNTER — Other Ambulatory Visit: Payer: Self-pay | Admitting: *Deleted

## 2022-05-16 ENCOUNTER — Ambulatory Visit: Payer: BC Managed Care – PPO | Admitting: *Deleted

## 2022-05-16 ENCOUNTER — Ambulatory Visit: Payer: BC Managed Care – PPO | Attending: Obstetrics and Gynecology

## 2022-05-16 VITALS — BP 122/72 | HR 92

## 2022-05-16 DIAGNOSIS — Z3689 Encounter for other specified antenatal screening: Secondary | ICD-10-CM | POA: Diagnosis not present

## 2022-05-16 DIAGNOSIS — O3663X1 Maternal care for excessive fetal growth, third trimester, fetus 1: Secondary | ICD-10-CM

## 2022-05-16 DIAGNOSIS — O360193 Maternal care for anti-D [Rh] antibodies, unspecified trimester, fetus 3: Secondary | ICD-10-CM | POA: Diagnosis not present

## 2022-05-16 DIAGNOSIS — O34219 Maternal care for unspecified type scar from previous cesarean delivery: Secondary | ICD-10-CM | POA: Diagnosis not present

## 2022-05-16 DIAGNOSIS — E669 Obesity, unspecified: Secondary | ICD-10-CM

## 2022-05-16 DIAGNOSIS — O99213 Obesity complicating pregnancy, third trimester: Secondary | ICD-10-CM

## 2022-05-16 DIAGNOSIS — O3660X0 Maternal care for excessive fetal growth, unspecified trimester, not applicable or unspecified: Secondary | ICD-10-CM

## 2022-05-16 DIAGNOSIS — O0993 Supervision of high risk pregnancy, unspecified, third trimester: Secondary | ICD-10-CM

## 2022-05-16 DIAGNOSIS — Z3A37 37 weeks gestation of pregnancy: Secondary | ICD-10-CM

## 2022-05-18 ENCOUNTER — Inpatient Hospital Stay: Payer: BC Managed Care – PPO

## 2022-05-18 VITALS — BP 127/73 | HR 81 | Temp 97.5°F | Resp 18

## 2022-05-18 DIAGNOSIS — D509 Iron deficiency anemia, unspecified: Secondary | ICD-10-CM | POA: Diagnosis not present

## 2022-05-18 DIAGNOSIS — Z1502 Genetic susceptibility to malignant neoplasm of ovary: Secondary | ICD-10-CM | POA: Diagnosis not present

## 2022-05-18 DIAGNOSIS — Z1509 Genetic susceptibility to other malignant neoplasm: Secondary | ICD-10-CM | POA: Diagnosis not present

## 2022-05-18 DIAGNOSIS — O99013 Anemia complicating pregnancy, third trimester: Secondary | ICD-10-CM

## 2022-05-18 MED ORDER — SODIUM CHLORIDE 0.9 % IV SOLN
200.0000 mg | Freq: Once | INTRAVENOUS | Status: AC
  Administered 2022-05-18: 200 mg via INTRAVENOUS
  Filled 2022-05-18: qty 200

## 2022-05-18 MED ORDER — SODIUM CHLORIDE 0.9 % IV SOLN
INTRAVENOUS | Status: DC
  Filled 2022-05-18: qty 250

## 2022-05-18 MED ORDER — SODIUM CHLORIDE 0.9% FLUSH
10.0000 mL | Freq: Once | INTRAVENOUS | Status: DC | PRN
  Filled 2022-05-18: qty 10

## 2022-05-18 NOTE — Patient Instructions (Signed)

## 2022-05-21 NOTE — L&D Delivery Note (Addendum)
OB/GYN Faculty Practice Delivery Note  Deborah House is a 32 y.o. [id number] s/p VBAC at [redacted]w[redacted]d. She was admitted for IOL BPP 4/10.   ROM: 29h 36m with clear fluid GBS Status:  Negative/-- (12/21 1533) Maximum Maternal Temperature: 98.45F  Labor Progress: Initial SVE: 0.5/60/-3. She then progressed to complete.   Delivery Date/Time: 05/31/22 1435 Delivery: Called to room and patient was complete and pushing. Head delivered OA. Nuchal cord present x1. Noted 50s shoulder dystocia, resolved with McRoberts and suprapubic maneuvers. Shoulder and body delivered in usual fashion. Infant with spontaneous cry, placed on mother's abdomen, dried and stimulated. Cord clamped x 2 after 1-minute delay, and cut by FOB. Cord blood drawn. Placenta delivered spontaneously with gentle cord traction. Fundus firm with massage and Pitocin, TXA and LUS. Labia, perineum, vagina, and cervix inspected with bilateral sulcal lacerations, repaired in usual fashion.  Baby Weight: pending  Placenta: 3 vessel, intact. Sent to L&D Complications: None Lacerations:  bilateral sulcal lacerations, repaired in usual fashion.  EBL: 406 mL Analgesia: Epidural   Infant:  APGAR (1 MIN): 8   APGAR (5 MINS): [place], DO OB Family Medicine Fellow, [facility] for [facility], [place] [facility] 05/31/2022, 3:23 PM

## 2022-05-22 ENCOUNTER — Ambulatory Visit: Payer: BC Managed Care – PPO | Admitting: *Deleted

## 2022-05-22 ENCOUNTER — Other Ambulatory Visit: Payer: Self-pay | Admitting: *Deleted

## 2022-05-22 ENCOUNTER — Ambulatory Visit: Payer: BC Managed Care – PPO | Attending: Maternal & Fetal Medicine

## 2022-05-22 VITALS — BP 117/58 | HR 91

## 2022-05-22 DIAGNOSIS — O99213 Obesity complicating pregnancy, third trimester: Secondary | ICD-10-CM

## 2022-05-22 DIAGNOSIS — O0993 Supervision of high risk pregnancy, unspecified, third trimester: Secondary | ICD-10-CM | POA: Diagnosis not present

## 2022-05-22 DIAGNOSIS — E669 Obesity, unspecified: Secondary | ICD-10-CM | POA: Diagnosis not present

## 2022-05-22 DIAGNOSIS — O3663X0 Maternal care for excessive fetal growth, third trimester, not applicable or unspecified: Secondary | ICD-10-CM | POA: Diagnosis not present

## 2022-05-22 DIAGNOSIS — O34219 Maternal care for unspecified type scar from previous cesarean delivery: Secondary | ICD-10-CM | POA: Insufficient documentation

## 2022-05-22 DIAGNOSIS — Z3A38 38 weeks gestation of pregnancy: Secondary | ICD-10-CM

## 2022-05-22 DIAGNOSIS — Z3689 Encounter for other specified antenatal screening: Secondary | ICD-10-CM | POA: Diagnosis not present

## 2022-05-23 ENCOUNTER — Ambulatory Visit (INDEPENDENT_AMBULATORY_CARE_PROVIDER_SITE_OTHER): Payer: BC Managed Care – PPO | Admitting: Obstetrics and Gynecology

## 2022-05-23 ENCOUNTER — Encounter: Payer: Self-pay | Admitting: Obstetrics and Gynecology

## 2022-05-23 VITALS — BP 133/83 | HR 103 | Wt 259.6 lb

## 2022-05-23 DIAGNOSIS — O0993 Supervision of high risk pregnancy, unspecified, third trimester: Secondary | ICD-10-CM

## 2022-05-23 DIAGNOSIS — Z3A38 38 weeks gestation of pregnancy: Secondary | ICD-10-CM

## 2022-05-23 DIAGNOSIS — O3663X0 Maternal care for excessive fetal growth, third trimester, not applicable or unspecified: Secondary | ICD-10-CM

## 2022-05-23 LAB — POCT URINALYSIS DIPSTICK OB
Bilirubin, UA: NEGATIVE
Blood, UA: NEGATIVE
Glucose, UA: NEGATIVE
Ketones, UA: POSITIVE
Leukocytes, UA: NEGATIVE
Nitrite, UA: NEGATIVE
Spec Grav, UA: 1.02 (ref 1.010–1.025)
Urobilinogen, UA: 0.2 E.U./dL
pH, UA: 7.5 (ref 5.0–8.0)

## 2022-05-23 NOTE — Progress Notes (Signed)
ROB: No problems-denies contractions.  Reports daily fetal movement.  Patient is "absolutely sure" that she does not want a repeat cesarean delivery despite the fact that the baby is 98th percentile.  She says recovery from cesarean delivery and taking care of 2 toddlers is too difficult.  The risks of Tolac and failed Tolac discussed in detail.  The fact that she has a 98th percentile baby decreases her chances of vaginal birth.  She is aware of all these facts. Induction scheduled for 1/10 at midnight.  Cervical check today shows long closed firm cervix.

## 2022-05-23 NOTE — Progress Notes (Signed)
Deborah House. Patient states daily fetal movement with braxton hicks. She states she would like a cervical check today. BPP yesterday, 8/8.   Patient states no questions or concerns at this time.

## 2022-05-29 ENCOUNTER — Other Ambulatory Visit: Payer: Self-pay

## 2022-05-29 ENCOUNTER — Ambulatory Visit: Payer: BC Managed Care – PPO | Admitting: *Deleted

## 2022-05-29 ENCOUNTER — Encounter (HOSPITAL_COMMUNITY): Payer: Self-pay | Admitting: Obstetrics and Gynecology

## 2022-05-29 ENCOUNTER — Other Ambulatory Visit: Payer: Self-pay | Admitting: Obstetrics

## 2022-05-29 ENCOUNTER — Inpatient Hospital Stay (HOSPITAL_COMMUNITY)
Admission: AD | Admit: 2022-05-29 | Discharge: 2022-06-02 | DRG: 806 | Disposition: A | Discharge status: Home / Self Care | Payer: BC Managed Care – PPO | Attending: Family Medicine | Admitting: Family Medicine

## 2022-05-29 ENCOUNTER — Ambulatory Visit (HOSPITAL_BASED_OUTPATIENT_CLINIC_OR_DEPARTMENT_OTHER): Payer: BC Managed Care – PPO | Admitting: Maternal & Fetal Medicine

## 2022-05-29 ENCOUNTER — Ambulatory Visit (HOSPITAL_BASED_OUTPATIENT_CLINIC_OR_DEPARTMENT_OTHER): Payer: BC Managed Care – PPO

## 2022-05-29 VITALS — BP 124/44 | HR 93

## 2022-05-29 DIAGNOSIS — Z98891 History of uterine scar from previous surgery: Secondary | ICD-10-CM | POA: Insufficient documentation

## 2022-05-29 DIAGNOSIS — O0993 Supervision of high risk pregnancy, unspecified, third trimester: Secondary | ICD-10-CM

## 2022-05-29 DIAGNOSIS — O3663X0 Maternal care for excessive fetal growth, third trimester, not applicable or unspecified: Secondary | ICD-10-CM | POA: Diagnosis not present

## 2022-05-29 DIAGNOSIS — O368390 Maternal care for abnormalities of the fetal heart rate or rhythm, unspecified trimester, not applicable or unspecified: Secondary | ICD-10-CM | POA: Insufficient documentation

## 2022-05-29 DIAGNOSIS — O34211 Maternal care for low transverse scar from previous cesarean delivery: Secondary | ICD-10-CM | POA: Diagnosis not present

## 2022-05-29 DIAGNOSIS — O99824 Streptococcus B carrier state complicating childbirth: Secondary | ICD-10-CM | POA: Diagnosis present

## 2022-05-29 DIAGNOSIS — O99019 Anemia complicating pregnancy, unspecified trimester: Secondary | ICD-10-CM | POA: Diagnosis present

## 2022-05-29 DIAGNOSIS — O1494 Unspecified pre-eclampsia, complicating childbirth: Secondary | ICD-10-CM | POA: Diagnosis not present

## 2022-05-29 DIAGNOSIS — O0990 Supervision of high risk pregnancy, unspecified, unspecified trimester: Secondary | ICD-10-CM

## 2022-05-29 DIAGNOSIS — Z6791 Unspecified blood type, Rh negative: Secondary | ICD-10-CM | POA: Diagnosis not present

## 2022-05-29 DIAGNOSIS — O99214 Obesity complicating childbirth: Secondary | ICD-10-CM | POA: Diagnosis not present

## 2022-05-29 DIAGNOSIS — Z23 Encounter for immunization: Secondary | ICD-10-CM | POA: Diagnosis not present

## 2022-05-29 DIAGNOSIS — O368930 Maternal care for other specified fetal problems, third trimester, not applicable or unspecified: Secondary | ICD-10-CM | POA: Diagnosis not present

## 2022-05-29 DIAGNOSIS — O3663X1 Maternal care for excessive fetal growth, third trimester, fetus 1: Secondary | ICD-10-CM

## 2022-05-29 DIAGNOSIS — Z3A39 39 weeks gestation of pregnancy: Secondary | ICD-10-CM

## 2022-05-29 DIAGNOSIS — O368330 Maternal care for abnormalities of the fetal heart rate or rhythm, third trimester, not applicable or unspecified: Secondary | ICD-10-CM | POA: Diagnosis not present

## 2022-05-29 DIAGNOSIS — O99213 Obesity complicating pregnancy, third trimester: Secondary | ICD-10-CM

## 2022-05-29 DIAGNOSIS — O360130 Maternal care for anti-D [Rh] antibodies, third trimester, not applicable or unspecified: Secondary | ICD-10-CM

## 2022-05-29 DIAGNOSIS — O34219 Maternal care for unspecified type scar from previous cesarean delivery: Secondary | ICD-10-CM

## 2022-05-29 DIAGNOSIS — O26893 Other specified pregnancy related conditions, third trimester: Secondary | ICD-10-CM | POA: Diagnosis present

## 2022-05-29 DIAGNOSIS — E669 Obesity, unspecified: Secondary | ICD-10-CM

## 2022-05-29 DIAGNOSIS — O9902 Anemia complicating childbirth: Secondary | ICD-10-CM | POA: Diagnosis not present

## 2022-05-29 DIAGNOSIS — O99210 Obesity complicating pregnancy, unspecified trimester: Secondary | ICD-10-CM | POA: Diagnosis present

## 2022-05-29 DIAGNOSIS — Z3490 Encounter for supervision of normal pregnancy, unspecified, unspecified trimester: Secondary | ICD-10-CM | POA: Diagnosis present

## 2022-05-29 DIAGNOSIS — O26899 Other specified pregnancy related conditions, unspecified trimester: Secondary | ICD-10-CM

## 2022-05-29 DIAGNOSIS — Z2989 Encounter for other specified prophylactic measures: Secondary | ICD-10-CM | POA: Diagnosis not present

## 2022-05-29 LAB — CBC
HCT: 38.1 % (ref 36.0–46.0)
Hemoglobin: 12.4 g/dL (ref 12.0–15.0)
MCH: 27.3 pg (ref 26.0–34.0)
MCHC: 32.5 g/dL (ref 30.0–36.0)
MCV: 83.7 fL (ref 80.0–100.0)
Platelets: 239 10*3/uL (ref 150–400)
RBC: 4.55 MIL/uL (ref 3.87–5.11)
RDW: 14.6 % (ref 11.5–15.5)
WBC: 10 10*3/uL (ref 4.0–10.5)
nRBC: 0 % (ref 0.0–0.2)

## 2022-05-29 LAB — TYPE AND SCREEN
ABO/RH(D): O NEG
Antibody Screen: POSITIVE

## 2022-05-29 MED ORDER — LACTATED RINGERS IV SOLN: INTRAVENOUS | Status: DC

## 2022-05-29 MED ORDER — LIDOCAINE HCL (PF) 1 % IJ SOLN
30.0000 mL | INTRAMUSCULAR | Status: AC | PRN
  Administered 2022-05-31: 30 mL via SUBCUTANEOUS
  Filled 2022-05-29: qty 30

## 2022-05-29 MED ORDER — ACETAMINOPHEN 325 MG PO TABS: 650.0000 mg | ORAL_TABLET | ORAL | Status: DC | PRN

## 2022-05-29 MED ORDER — OXYTOCIN-SODIUM CHLORIDE 30-0.9 UT/500ML-% IV SOLN
2.5000 [IU]/h | INTRAVENOUS | Status: DC
  Administered 2022-05-31: 2.5 [IU]/h via INTRAVENOUS
  Filled 2022-05-29 (×2): qty 500

## 2022-05-29 MED ORDER — LACTATED RINGERS IV SOLN
500.0000 mL | INTRAVENOUS | Status: DC | PRN
  Administered 2022-05-30 – 2022-05-31 (×3): 500 mL via INTRAVENOUS

## 2022-05-29 MED ORDER — FENTANYL CITRATE (PF) 100 MCG/2ML IJ SOLN
50.0000 ug | INTRAMUSCULAR | Status: DC | PRN
  Administered 2022-05-30 (×4): 100 ug via INTRAVENOUS
  Administered 2022-05-30: 50 ug via INTRAVENOUS
  Administered 2022-05-30: 100 ug via INTRAVENOUS
  Filled 2022-05-29 (×6): qty 2

## 2022-05-29 MED ORDER — OXYCODONE-ACETAMINOPHEN 5-325 MG PO TABS: 2.0000 | ORAL_TABLET | ORAL | Status: DC | PRN

## 2022-05-29 MED ORDER — SOD CITRATE-CITRIC ACID 500-334 MG/5ML PO SOLN: 30.0000 mL | ORAL | Status: DC | PRN

## 2022-05-29 MED ORDER — OXYTOCIN BOLUS FROM INFUSION
333.0000 mL | Freq: Once | INTRAVENOUS | Status: AC
  Administered 2022-05-31: 333 mL via INTRAVENOUS

## 2022-05-29 MED ORDER — OXYTOCIN-SODIUM CHLORIDE 30-0.9 UT/500ML-% IV SOLN
1.0000 m[IU]/min | INTRAVENOUS | Status: DC
  Administered 2022-05-29: 2 m[IU]/min via INTRAVENOUS
  Filled 2022-05-29: qty 500

## 2022-05-29 MED ORDER — OXYCODONE-ACETAMINOPHEN 5-325 MG PO TABS: 1.0000 | ORAL_TABLET | ORAL | Status: DC | PRN

## 2022-05-29 MED ORDER — TERBUTALINE SULFATE 1 MG/ML IJ SOLN: 0.2500 mg | Freq: Once | INTRAMUSCULAR | Status: DC | PRN

## 2022-05-29 MED ORDER — ONDANSETRON HCL 4 MG/2ML IJ SOLN
4.0000 mg | Freq: Four times a day (QID) | INTRAMUSCULAR | Status: DC | PRN
  Administered 2022-05-29 – 2022-05-31 (×6): 4 mg via INTRAVENOUS
  Filled 2022-05-29 (×6): qty 2

## 2022-05-29 MED ORDER — FAMOTIDINE IN NACL 20-0.9 MG/50ML-% IV SOLN
20.0000 mg | Freq: Once | INTRAVENOUS | Status: AC
  Administered 2022-05-29: 20 mg via INTRAVENOUS
  Filled 2022-05-29: qty 50

## 2022-05-29 NOTE — H&P (Signed)
History     CSN: [id number]  Arrival date and time: 05/29/22 1138   None     No chief complaint on file.  HPI This is a 32 year old G4 P2-0-1-2 at 39 weeks and 4 days with a pregnancy complicated by 2 prior C-sections, fetal macrosomia with a estimated fetal weight of 91 percentile (4150 g) with an AC greater than 99th percentile, maternal obesity with a BMI of 41.  She was followed by [facility] for maternal obesity.  She had a follow-up ultrasound with a BPP.  The BPP was 4 out of 8.  She was put on the monitor and the NST was reassuring but not reactive.  She was sent to our hospital as this was the closest hospital given the BPP 4 out of 10.  Here, the patient records good fetal movement with a reactive NST with multiple accelerations.  The patient desires to have a trial of labor.  OB History     Gravida  4   Para  2   Term  2   Preterm      AB  1   Living  2      SAB  1   IAB      Ectopic      Multiple  0   Live Births  2           Past Medical History:  Diagnosis Date   Abdominal pain    Amenorrhea    Anemia    Anemia affecting pregnancy 05/15/2017   Hgb 8.0 at 28 weeks [X]  hematology referral   Chlamydia 2012   tx'd   Family history of ovarian cancer 07/2015   genetic testing letter sent; 4/22 PALB2 positive   Genetic testing of female 08/2020   MyRisk PALB2 positive, with 2 MSH3, POLE, and RNF43 VUS   Hemorrhoids    Low-lying placenta in third trimester    Missed abortion 06/01/2014   RPH   Monoallelic mutation of PALB2 gene 08/2020   MyRisk; increased risk of breast/ovar/pancreatic cancer    Past Surgical History:  Procedure Laterality Date   CESAREAN SECTION N/A 06/27/2017   Procedure: CESAREAN SECTION;  Surgeon: 08/25/2017, MD;  Location: [facility] ORS;  Service: Obstetrics;  Laterality: N/A;   CESAREAN SECTION N/A 07/09/2019   Procedure: CESAREAN SECTION;  Surgeon: 07/11/2019, MD;  Location: [facility] ORS;  Service:  Obstetrics;  Laterality: N/A;   DILATION AND CURETTAGE OF UTERUS  06/01/2014   EYE SURGERY Left    when an infant   MOUTH SURGERY Left 2017   root canal    Family History  Problem Relation Age of Onset   Anemia Mother    Anemia Sister    Cancer Maternal Grandmother    Diabetes Maternal Grandmother    Ovarian cancer Maternal Grandmother 60   Asthma Neg Hx    Heart disease Neg Hx    Hypertension Neg Hx    Stroke Neg Hx     Social History   Tobacco Use   Smoking status: Never   Smokeless tobacco: Never  Vaping Use   Vaping Use: Never used  Substance Use Topics   Alcohol use: No   Drug use: No    Allergies: No Known Allergies  Medications Prior to Admission  Medication Sig Dispense Refill Last Dose   Doxylamine-Pyridoxine (DICLEGIS PO) Take by mouth.   05/29/2022   Prenatal Vit-Fe Fumarate-FA (MULTIVITAMIN-PRENATAL) 27-0.8 MG TABS tablet Take 1 tablet by mouth daily  at 12 noon.   05/28/2022    Review of Systems Physical Exam   Blood pressure 128/61, pulse 99, temperature 98 F (36.7 C), temperature source Oral, resp. rate 20, height 5\' 6"  (1.676 m), weight 117.7 kg, last menstrual period 07/17/2021, SpO2 100 %.  Physical Exam Vitals and nursing note reviewed. Exam conducted with a chaperone present.  Constitutional:      Appearance: Normal appearance.  Cardiovascular:     Rate and Rhythm: Normal rate and regular rhythm.  Pulmonary:     Effort: Pulmonary effort is normal.  Abdominal:     General: Abdomen is flat.     Palpations: Abdomen is soft.  Skin:    Capillary Refill: Capillary refill takes less than 2 seconds.  Neurological:     General: No focal deficit present.     Mental Status: She is alert.  Psychiatric:        Mood and Affect: Mood normal.        Behavior: Behavior normal.        Thought Content: Thought content normal.        Judgment: Judgment normal.     MAU Course  Procedures NST:  Baseline: 120s  Variability: moderate Accelerations:  multiple  Decelerations: none Contractions: none  MDM   Assessment and Plan  Mode of delivery discussed with patient.  At this point, with a reactive NST, I think that it would be fine to do a trial of labor.  I extensively discussed risks versus benefits of trial of labor including uterine rupture rates, rates of injury to the baby, healing times, etc.  Patient still prefers to trial labor.  We discussed that we will need to watch baby very closely to see if baby tolerates contractions.  Patient desires to breast-feed. Randel Books is a boy, planning on circumcision Patient's partner is planning on getting a vasectomy GBS negative  Truett Mainland 05/29/2022, 1:16 PM

## 2022-05-29 NOTE — Procedures (Signed)
Chris Gilberta Peeters [birth year] [redacted]w[redacted]d  Fetus A Non-Stress Test Interpretation for 05/29/22  Indication: Unsatisfactory BPP and obesity  Fetal Heart Rate A Mode: External Baseline Rate (A): 120 bpm Variability: Moderate Accelerations: 15 x 15 Decelerations: None Multiple birth?: No  Uterine Activity Mode: Palpation, Toco Contraction Frequency (min): UI Contraction Quality: Mild  Interpretation (Fetal Testing) Nonstress Test Interpretation: Reactive Comments: Dr. Epimenio Sarin reviewed tracing. Pt agreed to go to hospital now. Just as EFM D/C'd, 2 accels noted.

## 2022-05-29 NOTE — MAU Provider Note (Signed)
 History     CSN: [id number]  Arrival date and time: 05/29/22 1138   None     No chief complaint on file.  HPI This is a 31-year-old G4 P2-0-1-2 at 39 weeks and 4 days with a pregnancy complicated by 2 prior C-sections, fetal macrosomia with a estimated fetal weight of 91 percentile (4150 g) with an AC greater than 99th percentile, maternal obesity with a BMI of 41.  She was followed by [facility] for maternal obesity.  She had a follow-up ultrasound with a BPP.  The BPP was 4 out of 8.  She was put on the monitor and the NST was reassuring but not reactive.  She was sent to our hospital as this was the closest hospital given the BPP 4 out of 10.  Here, the patient records good fetal movement with a reactive NST with multiple accelerations.  The patient desires to have a trial of labor.  OB History     Gravida  4   Para  2   Term  2   Preterm      AB  1   Living  2      SAB  1   IAB      Ectopic      Multiple  0   Live Births  2           Past Medical History:  Diagnosis Date   Abdominal pain    Amenorrhea    Anemia    Anemia affecting pregnancy 05/15/2017   Hgb 8.0 at 28 weeks [X] hematology referral   Chlamydia 2012   tx'd   Family history of ovarian cancer 07/2015   genetic testing letter sent; 4/22 PALB2 positive   Genetic testing of female 08/2020   MyRisk PALB2 positive, with 2 MSH3, POLE, and RNF43 VUS   Hemorrhoids    Low-lying placenta in third trimester    Missed abortion 06/01/2014   RPH   Monoallelic mutation of PALB2 gene 08/2020   MyRisk; increased risk of breast/ovar/pancreatic cancer    Past Surgical History:  Procedure Laterality Date   CESAREAN SECTION N/A 06/27/2017   Procedure: CESAREAN SECTION;  Surgeon: Staebler, Andreas, MD;  Location: [facility] ORS;  Service: Obstetrics;  Laterality: N/A;   CESAREAN SECTION N/A 07/09/2019   Procedure: CESAREAN SECTION;  Surgeon: Harris, Robert P, MD;  Location: [facility] ORS;  Service:  Obstetrics;  Laterality: N/A;   DILATION AND CURETTAGE OF UTERUS  06/01/2014   EYE SURGERY Left    when an infant   MOUTH SURGERY Left 2017   root canal    Family History  Problem Relation Age of Onset   Anemia Mother    Anemia Sister    Cancer Maternal Grandmother    Diabetes Maternal Grandmother    Ovarian cancer Maternal Grandmother 60   Asthma Neg Hx    Heart disease Neg Hx    Hypertension Neg Hx    Stroke Neg Hx     Social History   Tobacco Use   Smoking status: Never   Smokeless tobacco: Never  Vaping Use   Vaping Use: Never used  Substance Use Topics   Alcohol use: No   Drug use: No    Allergies: No Known Allergies  Medications Prior to Admission  Medication Sig Dispense Refill Last Dose   Doxylamine-Pyridoxine (DICLEGIS PO) Take by mouth.   05/29/2022   Prenatal Vit-Fe Fumarate-FA (MULTIVITAMIN-PRENATAL) 27-0.8 MG TABS tablet Take 1 tablet by mouth daily   at 12 noon.   05/28/2022    Review of Systems Physical Exam   Blood pressure 128/61, pulse 99, temperature 98 F (36.7 C), temperature source Oral, resp. rate 20, height 5' 6" (1.676 m), weight 117.7 kg, last menstrual period 07/17/2021, SpO2 100 %.  Physical Exam Vitals and nursing note reviewed. Exam conducted with a chaperone present.  Constitutional:      Appearance: Normal appearance.  Cardiovascular:     Rate and Rhythm: Normal rate and regular rhythm.  Pulmonary:     Effort: Pulmonary effort is normal.  Abdominal:     General: Abdomen is flat.     Palpations: Abdomen is soft.  Skin:    Capillary Refill: Capillary refill takes less than 2 seconds.  Neurological:     General: No focal deficit present.     Mental Status: She is alert.  Psychiatric:        Mood and Affect: Mood normal.        Behavior: Behavior normal.        Thought Content: Thought content normal.        Judgment: Judgment normal.     MAU Course  Procedures NST:  Baseline: 120s  Variability: moderate Accelerations:  multiple  Decelerations: none Contractions: none  MDM   Assessment and Plan  Mode of delivery discussed with patient.  At this point, with a reactive NST, I think that it would be fine to do a trial of labor.  I extensively discussed risks versus benefits of trial of labor including uterine rupture rates, rates of injury to the baby, healing times, etc.  Patient still prefers to trial labor.  We discussed that we will need to watch baby very closely to see if baby tolerates contractions.  Patient desires to breast-feed. Baby is a boy, planning on circumcision Patient's partner is planning on getting a vasectomy GBS negative  Krislynn Gronau J Haskel Dewalt 05/29/2022, 1:16 PM  

## 2022-05-29 NOTE — Progress Notes (Signed)
MFM Consult Note Patient Name: Deborah House [facility]  Patient MRN:   [id number]  Referring provider: [place]   Reason for Consult: BPP 4/8  HPI: Deborah House is a 32 y.o. [id number] at [redacted]w[redacted]d here for ultrasound and consultation.   The patient was seen here for a BPP for BMI >40 and it was found to be 4/8. Her NST was reassuring but not reactive after 20 minutes.  I discussed my concern for intrauterine fetal hypoxia and increased risk of stillbirth and recommend she go to the nearest hospital for immediate evaluation and consideration of delivery.  She was considering a trial of labor after cesarean but is understands that she may need a repeat C-section.  She last ate a fruit cup around 9:30 AM.  I have called the on-call OB at [facility] labor and delivery to watch for her arrival.  The patient the patient verbalized understanding and will go straight to the hospital for evaluation.    Review of Systems: A review of systems was performed and was negative except per HPI   Vitals and Physical Exam See intake sheet for vitals Sitting comfortably on the sonogram table Nonlabored breathing Normal rate and rhythm Abdomen is nontender  Sonographic findings Single intrauterine pregnancy. Fetal cardiac activity: Observed. Presentation: Cephalic. Interval fetal anatomy appears normal.  Amniotic fluid volume: Within normal limits. AFI: 17.03 cm.  MVP: 5.47 cm. Placenta: Anterior. BPP: 4/10.  Sustained breathing and tone were not noted during the 30 min exam.   Assessment -Nonreassuring antepartum testing (BPP 4/8, reassuring NST but not reactive at 20 min)  Plan -Sent to the nearest L&D for evaluation and delivery  I spent 30 minutes reviewing the patients chart, including labs and images as well as counseling the patient about her medical conditions.  Valeda Malm  MFM, [place]   05/29/2022  11:26 AM

## 2022-05-29 NOTE — MAU Note (Signed)
Deborah House is a 32 y.o. at [redacted]w[redacted]d here in MAU reporting: sent from office for fetal monitoring secondary wasn't practice breathing during ultrasound.  Denies VB or LOF.  Endorses +FM when poked at. States for IOL @ [facility] tonight.  Attempting TOLAC after previous Section x2. LMP: NA Onset of complaint: today Pain score: 0 Vitals:   05/29/22 1158  BP: 129/73  Pulse: 80  Resp: 20  Temp: 98 F (36.7 C)  SpO2: 100%     FHT:125 bpm Lab orders placed from triage:   None

## 2022-05-30 ENCOUNTER — Inpatient Hospital Stay (HOSPITAL_COMMUNITY): Payer: BC Managed Care – PPO | Admitting: Anesthesiology

## 2022-05-30 ENCOUNTER — Encounter: Payer: Self-pay | Admitting: Oncology

## 2022-05-30 LAB — CBC WITH DIFFERENTIAL/PLATELET
Abs Immature Granulocytes: 0.06 10*3/uL (ref 0.00–0.07)
Abs Immature Granulocytes: 0.05 10*3/uL (ref 0.00–0.07)
Basophils Absolute: 0 10*3/uL (ref 0.0–0.1)
Basophils Absolute: 0 10*3/uL (ref 0.0–0.1)
Basophils Relative: 0 %
Basophils Relative: 0 %
Eosinophils Absolute: 0 10*3/uL (ref 0.0–0.5)
Eosinophils Absolute: 0 10*3/uL (ref 0.0–0.5)
Eosinophils Relative: 0 %
Eosinophils Relative: 0 %
HCT: 35.2 % — ABNORMAL LOW (ref 36.0–46.0)
HCT: 37 % (ref 36.0–46.0)
Hemoglobin: 11.3 g/dL — ABNORMAL LOW (ref 12.0–15.0)
Hemoglobin: 11.9 g/dL — ABNORMAL LOW (ref 12.0–15.0)
Immature Granulocytes: 1 %
Immature Granulocytes: 1 %
Lymphocytes Relative: 23 %
Lymphocytes Relative: 22 %
Lymphs Abs: 3 10*3/uL (ref 0.7–4.0)
Lymphs Abs: 2.2 10*3/uL (ref 0.7–4.0)
MCH: 26.7 pg (ref 26.0–34.0)
MCH: 26.9 pg (ref 26.0–34.0)
MCHC: 32.1 g/dL (ref 30.0–36.0)
MCHC: 32.2 g/dL (ref 30.0–36.0)
MCV: 83 fL (ref 80.0–100.0)
MCV: 83.7 fL (ref 80.0–100.0)
Monocytes Absolute: 0.7 10*3/uL (ref 0.1–1.0)
Monocytes Absolute: 0.4 10*3/uL (ref 0.1–1.0)
Monocytes Relative: 6 %
Monocytes Relative: 4 %
Neutro Abs: 9.2 10*3/uL — ABNORMAL HIGH (ref 1.7–7.7)
Neutro Abs: 7.6 10*3/uL (ref 1.7–7.7)
Neutrophils Relative %: 70 %
Neutrophils Relative %: 73 %
Platelets: 229 10*3/uL (ref 150–400)
Platelets: 220 10*3/uL (ref 150–400)
RBC: 4.24 MIL/uL (ref 3.87–5.11)
RBC: 4.42 MIL/uL (ref 3.87–5.11)
RDW: 14.5 % (ref 11.5–15.5)
RDW: 14.4 % (ref 11.5–15.5)
WBC: 13 10*3/uL — ABNORMAL HIGH (ref 4.0–10.5)
WBC: 10.4 10*3/uL (ref 4.0–10.5)
nRBC: 0 % (ref 0.0–0.2)
nRBC: 0 % (ref 0.0–0.2)

## 2022-05-30 LAB — COMPREHENSIVE METABOLIC PANEL
ALT: 12 U/L (ref 0–44)
AST: 17 U/L (ref 15–41)
Albumin: 2.9 g/dL — ABNORMAL LOW (ref 3.5–5.0)
Alkaline Phosphatase: 58 U/L (ref 38–126)
Anion gap: 6 (ref 5–15)
BUN: <5 mg/dL — ABNORMAL LOW (ref 6–20)
CO2: 21 mmol/L — ABNORMAL LOW (ref 22–32)
Calcium: 8.7 mg/dL — ABNORMAL LOW (ref 8.9–10.3)
Chloride: 106 mmol/L (ref 98–111)
Creatinine, Ser: 0.6 mg/dL (ref 0.44–1.00)
GFR, Estimated: >60 mL/min (ref >60)
Glucose, Bld: 93 mg/dL (ref 70–99)
Potassium: 3.7 mmol/L (ref 3.5–5.1)
Sodium: 133 mmol/L — ABNORMAL LOW (ref 135–145)
Total Bilirubin: 1.2 mg/dL (ref 0.3–1.2)
Total Protein: 6.2 g/dL — ABNORMAL LOW (ref 6.5–8.1)

## 2022-05-30 LAB — CBC
HCT: 35.8 % — ABNORMAL LOW (ref 36.0–46.0)
Hemoglobin: 11.6 g/dL — ABNORMAL LOW (ref 12.0–15.0)
MCH: 26.5 pg (ref 26.0–34.0)
MCHC: 32.4 g/dL (ref 30.0–36.0)
MCV: 81.9 fL (ref 80.0–100.0)
Platelets: 215 10*3/uL (ref 150–400)
RBC: 4.37 MIL/uL (ref 3.87–5.11)
RDW: 14.2 % (ref 11.5–15.5)
WBC: 12.9 10*3/uL — ABNORMAL HIGH (ref 4.0–10.5)
nRBC: 0 % (ref 0.0–0.2)

## 2022-05-30 LAB — PROTEIN / CREATININE RATIO, URINE
Creatinine, Urine: 121 mg/dL
Protein Creatinine Ratio: 0.37 mg/mg{Cre} — ABNORMAL HIGH (ref 0.00–0.15)
Total Protein, Urine: 45 mg/dL

## 2022-05-30 LAB — RPR: RPR Ser Ql: NONREACTIVE

## 2022-05-30 MED ORDER — FAMOTIDINE IN NACL 20-0.9 MG/50ML-% IV SOLN
20.0000 mg | Freq: Once | INTRAVENOUS | Status: AC
  Administered 2022-05-30: 20 mg via INTRAVENOUS
  Filled 2022-05-30: qty 50

## 2022-05-30 MED ORDER — PHENYLEPHRINE 80 MCG/ML (10ML) SYRINGE FOR IV PUSH (FOR BLOOD PRESSURE SUPPORT): 80.0000 ug | PREFILLED_SYRINGE | INTRAVENOUS | Status: DC | PRN

## 2022-05-30 MED ORDER — LACTATED RINGERS IV SOLN: 500.0000 mL | Freq: Once | INTRAVENOUS | Status: DC

## 2022-05-30 MED ORDER — DIPHENHYDRAMINE HCL 50 MG/ML IJ SOLN: 12.5000 mg | INTRAMUSCULAR | Status: DC | PRN

## 2022-05-30 MED ORDER — OXYTOCIN-SODIUM CHLORIDE 30-0.9 UT/500ML-% IV SOLN
1.0000 m[IU]/min | INTRAVENOUS | Status: DC
  Administered 2022-05-30: 9 m[IU]/min via INTRAVENOUS

## 2022-05-30 MED ORDER — PROCHLORPERAZINE EDISYLATE 10 MG/2ML IJ SOLN
10.0000 mg | INTRAMUSCULAR | Status: DC | PRN
  Administered 2022-05-31: 10 mg via INTRAVENOUS
  Filled 2022-05-30 (×3): qty 2

## 2022-05-30 MED ORDER — FENTANYL-BUPIVACAINE-NACL 0.5-0.125-0.9 MG/250ML-% EP SOLN
12.0000 mL/h | EPIDURAL | Status: DC | PRN
  Administered 2022-05-30: 12 mL/h via EPIDURAL
  Filled 2022-05-30: qty 250

## 2022-05-30 MED ORDER — EPHEDRINE 5 MG/ML INJ: 10.0000 mg | INTRAVENOUS | Status: DC | PRN

## 2022-05-30 MED ORDER — TERBUTALINE SULFATE 1 MG/ML IJ SOLN: 0.2500 mg | Freq: Once | INTRAMUSCULAR | Status: DC | PRN

## 2022-05-30 NOTE — Progress Notes (Signed)
LABOR PROGRESS NOTE  Idaliz Tinkle is a 32 y.o. [id number] at [redacted]w[redacted]d  admitted for IOL for BPP 4/10  Subjective: Resting with FB in place   Objective: BP (!) 149/80   Pulse 78   Temp 98 F (36.7 C)   Resp 18   Ht 5\' 6"  (1.676 m)   Wt 117.7 kg   LMP 07/17/2021 (Exact Date)   SpO2 100%   BMI 41.87 kg/m  or  Vitals:   05/29/22 2229 05/29/22 2323 05/30/22 0027 05/30/22 0249  BP: (!) 135/92 (!) 116/101 (!) 150/63 (!) 149/80  Pulse: 84 87 84 78  Resp:   18 18  Temp:   97.7 F (36.5 C) 98 F (36.7 C)  TempSrc:   Oral   SpO2:      Weight:      Height:         Dilation: 1.5 Effacement (%): 60 Station: -3, -2 Presentation: Vertex Exam by:: Marcene Duos, RN FHT: baseline rate 120, moderate varibility, + acel, no decel Toco: every 3 min   Labs: Lab Results  Component Value Date   WBC 10.0 05/29/2022   HGB 12.4 05/29/2022   HCT 38.1 05/29/2022   MCV 83.7 05/29/2022   PLT 239 05/29/2022    Patient Active Problem List   Diagnosis Date Noted   Encounter for induction of labor 05/29/2022   Desires VBAC (vaginal birth after cesarean) trial 03/20/2022   Supervision of high-risk pregnancy 10/04/2021   History of 2 cesarean sections 07/09/2019   Rh negative state in antepartum period 12/09/2018   Anemia affecting pregnancy 05/15/2017   Obesity affecting pregnancy 11/07/2016    Assessment / Plan: 32 y.o. [id number] at [redacted]w[redacted]d here for IOL for BPP 4/10  Labor: continue to monitor for FB out. Will increase pitocin when this occurs  Fetal Wellbeing:  Cat 1 Pain Control:  Per patient request Anticipated MOD:  guarded  Gifford Shave, MD  OB Fellow  05/30/2022, 3:17 AM

## 2022-05-30 NOTE — Anesthesia Preprocedure Evaluation (Signed)
Anesthesia Evaluation  Patient identified by MRN, date of birth, ID band Patient awake    Reviewed: Allergy & Precautions, NPO status , Patient's Chart, lab work & pertinent test results  Airway Mallampati: III  TM Distance: >3 FB Neck ROM: Full    Dental no notable dental hx.    Pulmonary neg pulmonary ROS   Pulmonary exam normal breath sounds clear to auscultation       Cardiovascular hypertension (preE without severe features), Normal cardiovascular exam Rhythm:Regular Rate:Normal     Neuro/Psych negative neurological ROS  negative psych ROS   GI/Hepatic negative GI ROS, Neg liver ROS,,,  Endo/Other    Morbid obesity (BMI 42)  Renal/GU negative Renal ROS  negative genitourinary   Musculoskeletal negative musculoskeletal ROS (+)    Abdominal   Peds  Hematology negative hematology ROS (+)   Anesthesia Other Findings IOL for BPP 4/8. H/o Csx2, TOLAC  Reproductive/Obstetrics (+) Pregnancy                             Anesthesia Physical Anesthesia Plan  ASA: 3  Anesthesia Plan: Epidural   Post-op Pain Management:    Induction:   PONV Risk Score and Plan: Treatment may vary due to age or medical condition  Airway Management Planned: Natural Airway  Additional Equipment:   Intra-op Plan:   Post-operative Plan:   Informed Consent: I have reviewed the patients History and Physical, chart, labs and discussed the procedure including the risks, benefits and alternatives for the proposed anesthesia with the patient or authorized representative who has indicated his/her understanding and acceptance.       Plan Discussed with: Anesthesiologist  Anesthesia Plan Comments: (Patient identified. Risks, benefits, options discussed with patient including but not limited to bleeding, infection, nerve damage, paralysis, failed block, incomplete pain control, headache, blood pressure changes,  nausea, vomiting, reactions to medication, itching, and post partum back pain. Confirmed with bedside nurse the patient's most recent platelet count. Confirmed with the patient that they are not taking any anticoagulation, have any bleeding history or any family history of bleeding disorders. Patient expressed understanding and wishes to proceed. All questions were answered. )       Anesthesia Quick Evaluation

## 2022-05-30 NOTE — Progress Notes (Signed)
Labor Progress Note Deborah House is a 32 y.o. [id number] at [redacted]w[redacted]d presented for IOL due to BPP 4/10. She has had 2 prior C-sections, first was for placenta previa, 2nd was an elective repeat C-section. This pregnancy is complicated by pre-eclampsia w/o SF, anemia, LGA baby with EFW 4150g. 91%ile and AC >99%ile  S: slow progress throughout the day, but now in active phase at last cervical check.  O:  BP (!) 102/55   Pulse 67   Temp 97.6 F (36.4 C) (Oral)   Resp 16   Ht 5\' 6"  (1.676 m)   Wt 117.7 kg   LMP 07/17/2021 (Exact Date)   SpO2 100%   BMI 41.87 kg/m  EFM: 115bpm/moderate/+accels, having intermittent early and some times late decels. contractions: IUPC with ~140MVU  CVE: Dilation: 6 Effacement (%): 60, 70 Cervical Position: Posterior Station: -2, -1 Presentation: Vertex Exam by:: Dr. Trina Ao +cervical swelling noted.    A&P: 32 y.o. [id number] [redacted]w[redacted]d IOL for BPP 4/10.  #Labor: Progressing slowly, but now in active phase. Pit 18 units, (max 20 units for her, given C/S X2).  #Pain: discussed pain mgt plan, she is now open to getting an epidural. #FWB: Cat 2, due to intermittent late decels #GBS negative  Pre-eclampsia w/o SF - blood pressures: intermittently high DBP, but overall well controlled. None in severe range.  Deborah House Sherrilyn Rist, MD 10:04 PM

## 2022-05-30 NOTE — Progress Notes (Signed)
Labor Progress Note  Deborah House is a 32 y.o. [id number] at [redacted]w[redacted]d presented for IOL Bpp 4/10  S: Patient doing well.  Desires fentanyl for pain management   O:  BP 128/71   Pulse 75   Temp (!) 96.2 F (35.7 C) (Axillary)   Resp 16   Ht 5\' 6"  (1.676 m)   Wt 117.7 kg   LMP 07/17/2021 (Exact Date)   SpO2 100%   BMI 41.87 kg/m  EFM: 120 bpm/Moderate variability/ 15x15 accels/ None decels  CVE: Dilation: 5 Effacement (%): 50 Cervical Position: Posterior Station: -3 Presentation: Vertex Exam by:: Dr. Clement Husbands   A&P: 72 y.o. [id number] [redacted]w[redacted]d  here for IOL as above  #Labor: Progressing well. AROM clear and IUPC placed. Pitocon up 1x1 max 22mu/min #Pain: Family/Friend support and PO/IV pain meds #FWB: CAT 1 #GBS negative  Shelda Pal, DO [place] Fellow, Faculty practice [place] for [place] 05/30/22  9:33 AM

## 2022-05-30 NOTE — Progress Notes (Addendum)
Labor Progress Note  Deborah House is a 32 y.o. [id number] at [redacted]w[redacted]d presented for IOL for BPP 4/10  S: Pt endorses current pain score of 7/10 and two episodes of vomiting likely secondary to IV Fentanyl. Pt also endorses bloody show which she compared the amount to less than a normal menstrual period and denies marked pain of her two previous cesarean scars. She states she is still able to feel her contractions and does not endorse any other complaints at this time.   O:  BP 113/69   Pulse 72   Temp 97.6 F (36.4 C) (Oral)   Resp 15   Ht 5\' 6"  (1.676 m)   Wt 117.7 kg   LMP 07/17/2021 (Exact Date)   SpO2 100%   BMI 41.87 kg/m  EFM: 125 bpm/Moderate variability/ 15x15 accels/ None decels  CVE: Dilation: 5.5 Effacement (%): 60 Cervical Position: Posterior Station: -2, -3 Presentation: Vertex Exam by:: Tommye Standard, RN   A&P: 32 y.o. [phone] [redacted]w[redacted]d  here for IOL for BPP of 4/10 as above  #Labor: Contractions have spaced out, concern for oxytocin receptors being overloaded. Plan to stop pit at this time to allow for washout. Continue switching maternal positions to allow for fetal descent.  #Pain: Family/Friend support and PO/IV pain meds #FWB: CAT 1 #GBS negative  Bradly Bienenstock, Student-PA PA-S [employer] for [facility] 05/30/22  3:30 PM ___  Hyman Bower of Supervision of Student:  I confirm that I have verified the information documented in the physician assistant student's note and that I have also personally performed the history, physical exam and all medical decision making activities.  I have verified that all services and findings are accurately documented in this student's note; and I agree with management and plan as outlined in the documentation. I have also made any necessary editorial changes.   Shelda Pal, [place] for [employer], [place] [facility] 05/30/2022 4:25 PM

## 2022-05-30 NOTE — Progress Notes (Signed)
LABOR PROGRESS NOTE  Deborah House is a 32 y.o. [phone] at [redacted]w[redacted]d  admitted for IOL for BPP 4/10  Subjective: Comfortable not feeling contractions   Objective: BP (!) 149/80   Pulse 78   Temp 98 F (36.7 C)   Resp 18   Ht 5\' 6"  (1.676 m)   Wt 117.7 kg   LMP 07/17/2021 (Exact Date)   SpO2 100%   BMI 41.87 kg/m  or  Vitals:   05/29/22 2229 05/29/22 2323 05/30/22 0027 05/30/22 0249  BP: (!) 135/92 (!) 116/101 (!) 150/63 (!) 149/80  Pulse: 84 87 84 78  Resp:   18 18  Temp:   97.7 F (36.5 C) 98 F (36.7 C)  TempSrc:   Oral   SpO2:      Weight:      Height:         Dilation: 1.5 Effacement (%): 60 Station: -3, -2 Presentation: Vertex Exam by:: Marcene Duos, RN FHT: baseline rate 120, moderate varibility, + acel, no decel Toco: every 3 min   Labs: Lab Results  Component Value Date   WBC 10.0 05/29/2022   HGB 12.4 05/29/2022   HCT 38.1 05/29/2022   MCV 83.7 05/29/2022   PLT 239 05/29/2022    Patient Active Problem List   Diagnosis Date Noted   Encounter for induction of labor 05/29/2022   Desires VBAC (vaginal birth after cesarean) trial 03/20/2022   Supervision of high-risk pregnancy 10/04/2021   History of 2 cesarean sections 07/09/2019   Rh negative state in antepartum period 12/09/2018   Anemia affecting pregnancy 05/15/2017   Obesity affecting pregnancy 11/07/2016    Assessment / Plan: 33 y.o. [id number] at [redacted]w[redacted]d here for IOL for BPP 4/10  Labor: FB placed and on low dose pitocin. Can increase when FB out  Fetal Wellbeing:  Cat 1 Pain Control:  Per patient request Anticipated MOD:  guarded  Gifford Shave, MD  OB Fellow  05/30/2022, 3:14 AM

## 2022-05-31 ENCOUNTER — Encounter (HOSPITAL_COMMUNITY): Payer: Self-pay | Admitting: Family Medicine

## 2022-05-31 DIAGNOSIS — O34211 Maternal care for low transverse scar from previous cesarean delivery: Secondary | ICD-10-CM

## 2022-05-31 DIAGNOSIS — Z3A39 39 weeks gestation of pregnancy: Secondary | ICD-10-CM

## 2022-05-31 LAB — CBC WITH DIFFERENTIAL/PLATELET
Abs Immature Granulocytes: 0.05 10*3/uL (ref 0.00–0.07)
Basophils Absolute: 0 10*3/uL (ref 0.0–0.1)
Basophils Relative: 0 %
Eosinophils Absolute: 0 10*3/uL (ref 0.0–0.5)
Eosinophils Relative: 0 %
HCT: 32.3 % — ABNORMAL LOW (ref 36.0–46.0)
Hemoglobin: 10.6 g/dL — ABNORMAL LOW (ref 12.0–15.0)
Immature Granulocytes: 0 %
Lymphocytes Relative: 13 %
Lymphs Abs: 1.6 10*3/uL (ref 0.7–4.0)
MCH: 27.2 pg (ref 26.0–34.0)
MCHC: 32.8 g/dL (ref 30.0–36.0)
MCV: 82.8 fL (ref 80.0–100.0)
Monocytes Absolute: 0.7 10*3/uL (ref 0.1–1.0)
Monocytes Relative: 6 %
Neutro Abs: 10.2 10*3/uL — ABNORMAL HIGH (ref 1.7–7.7)
Neutrophils Relative %: 81 %
Platelets: 200 10*3/uL (ref 150–400)
RBC: 3.9 MIL/uL (ref 3.87–5.11)
RDW: 14.5 % (ref 11.5–15.5)
WBC: 12.5 10*3/uL — ABNORMAL HIGH (ref 4.0–10.5)
nRBC: 0 % (ref 0.0–0.2)

## 2022-05-31 MED ORDER — TRANEXAMIC ACID-NACL 1000-0.7 MG/100ML-% IV SOLN
INTRAVENOUS | Status: AC
  Filled 2022-05-31: qty 100

## 2022-05-31 MED ORDER — OXYTOCIN-SODIUM CHLORIDE 30-0.9 UT/500ML-% IV SOLN
1.0000 m[IU]/min | INTRAVENOUS | Status: DC
  Administered 2022-05-31: 4 m[IU]/min via INTRAVENOUS

## 2022-05-31 MED ORDER — BENZOCAINE-MENTHOL 20-0.5 % EX AERO
1.0000 | INHALATION_SPRAY | CUTANEOUS | Status: DC | PRN
  Filled 2022-05-31: qty 56

## 2022-05-31 MED ORDER — COCONUT OIL OIL: 1.0000 | TOPICAL_OIL | Status: DC | PRN

## 2022-05-31 MED ORDER — SIMETHICONE 80 MG PO CHEW: 80.0000 mg | CHEWABLE_TABLET | ORAL | Status: DC | PRN

## 2022-05-31 MED ORDER — ZOLPIDEM TARTRATE 5 MG PO TABS: 5.0000 mg | ORAL_TABLET | Freq: Every evening | ORAL | Status: DC | PRN

## 2022-05-31 MED ORDER — DIBUCAINE (PERIANAL) 1 % EX OINT: 1.0000 | TOPICAL_OINTMENT | CUTANEOUS | Status: DC | PRN

## 2022-05-31 MED ORDER — ONDANSETRON HCL 4 MG PO TABS: 4.0000 mg | ORAL_TABLET | ORAL | Status: DC | PRN

## 2022-05-31 MED ORDER — WITCH HAZEL-GLYCERIN EX PADS: 1.0000 | MEDICATED_PAD | CUTANEOUS | Status: DC | PRN

## 2022-05-31 MED ORDER — DEXTROSE IN LACTATED RINGERS 5 % IV SOLN: INTRAVENOUS | Status: DC

## 2022-05-31 MED ORDER — OXYTOCIN-SODIUM CHLORIDE 30-0.9 UT/500ML-% IV SOLN
1.0000 m[IU]/min | INTRAVENOUS | Status: DC
  Administered 2022-05-31: 10 m[IU]/min via INTRAVENOUS

## 2022-05-31 MED ORDER — DIPHENHYDRAMINE HCL 25 MG PO CAPS: 25.0000 mg | ORAL_CAPSULE | Freq: Four times a day (QID) | ORAL | Status: DC | PRN

## 2022-05-31 MED ORDER — LIDOCAINE-EPINEPHRINE (PF) 2 %-1:200000 IJ SOLN
INTRAMUSCULAR | Status: DC | PRN
  Administered 2022-05-30: 5 mL via EPIDURAL

## 2022-05-31 MED ORDER — SODIUM CHLORIDE 0.9% FLUSH: 3.0000 mL | Freq: Two times a day (BID) | INTRAVENOUS | Status: DC

## 2022-05-31 MED ORDER — OXYTOCIN-SODIUM CHLORIDE 30-0.9 UT/500ML-% IV SOLN
1.0000 m[IU]/min | INTRAVENOUS | Status: DC
  Administered 2022-05-31: 21 m[IU]/min via INTRAVENOUS

## 2022-05-31 MED ORDER — IBUPROFEN 600 MG PO TABS
600.0000 mg | ORAL_TABLET | Freq: Four times a day (QID) | ORAL | Status: DC
  Administered 2022-05-31 – 2022-06-02 (×6): 600 mg via ORAL
  Filled 2022-05-31 (×6): qty 1

## 2022-05-31 MED ORDER — DIPHENHYDRAMINE HCL 50 MG/ML IJ SOLN
25.0000 mg | Freq: Once | INTRAMUSCULAR | Status: AC
  Administered 2022-05-31: 25 mg via INTRAVENOUS
  Filled 2022-05-31: qty 1

## 2022-05-31 MED ORDER — SODIUM CHLORIDE 0.9 % IV SOLN: 250.0000 mL | INTRAVENOUS | Status: DC | PRN

## 2022-05-31 MED ORDER — DEXTROSE IN LACTATED RINGERS 5 % IV SOLN: INTRAVENOUS | Status: AC

## 2022-05-31 MED ORDER — SODIUM CHLORIDE 0.9% FLUSH: 3.0000 mL | INTRAVENOUS | Status: DC | PRN

## 2022-05-31 MED ORDER — SENNOSIDES-DOCUSATE SODIUM 8.6-50 MG PO TABS
2.0000 | ORAL_TABLET | ORAL | Status: DC
  Administered 2022-06-01: 2 via ORAL
  Filled 2022-05-31: qty 2

## 2022-05-31 MED ORDER — CALCIUM CARBONATE ANTACID 500 MG PO CHEW
2.0000 | CHEWABLE_TABLET | Freq: Once | ORAL | Status: AC
  Administered 2022-05-31: 400 mg via ORAL
  Filled 2022-05-31: qty 2

## 2022-05-31 MED ORDER — PRENATAL MULTIVITAMIN CH
1.0000 | ORAL_TABLET | Freq: Every day | ORAL | Status: DC
  Administered 2022-06-01: 1 via ORAL
  Filled 2022-05-31: qty 1

## 2022-05-31 MED ORDER — LACTATED RINGERS IV BOLUS
1000.0000 mL | Freq: Once | INTRAVENOUS | Status: AC
  Administered 2022-05-31: 1000 mL via INTRAVENOUS

## 2022-05-31 MED ORDER — TRANEXAMIC ACID-NACL 1000-0.7 MG/100ML-% IV SOLN
1000.0000 mg | INTRAVENOUS | Status: AC
  Administered 2022-05-31: 1000 mg via INTRAVENOUS

## 2022-05-31 MED ORDER — ACETAMINOPHEN 325 MG PO TABS
650.0000 mg | ORAL_TABLET | ORAL | Status: DC | PRN
  Administered 2022-05-31 – 2022-06-02 (×4): 650 mg via ORAL
  Filled 2022-05-31 (×4): qty 2

## 2022-05-31 MED ORDER — ONDANSETRON HCL 4 MG/2ML IJ SOLN: 4.0000 mg | INTRAMUSCULAR | Status: DC | PRN

## 2022-05-31 NOTE — Progress Notes (Signed)
Labor Progress Note  Deborah House is a 32 y.o. [id number] at [redacted]w[redacted]d presented for IOL Bpp 4/10  S: Pt feeling contractions. Also noted prolonged deceleration.   O:  BP 99/61   Pulse 76   Temp 97.9 F (36.6 C) (Oral)   Resp 15   Ht 5\' 6"  (1.676 m)   Wt 117.7 kg   LMP 07/17/2021 (Exact Date)   SpO2 97%   BMI 41.87 kg/m  EFM: 120bpm/Moderate variability/ 15x15 accels/ Late decels  CVE: Dilation: 10 Dilation Complete Date: 05/31/22 Dilation Complete Time: 1050 Effacement (%): 90 Cervical Position: Posterior Station: Plus 2 Presentation: Vertex Exam by:: Dr. Clement Husbands   A&P: 19 y.o. [id number] [redacted]w[redacted]d  here for IOL TOLAC as above  #Labor: Progressing well. SVE complete. Will set tup for delivery and will start pushing. Piutocin is off for now. IVB given and pt turned to her side. #Pain: Epidural #FWB: CAT 2 #GBS positive  Shelda Pal, DO [place] Fellow, Faculty practice [place] for [place] 05/31/22  10:54 AM

## 2022-05-31 NOTE — Progress Notes (Signed)
Labor Progress Note  Deborah House is a 32 y.o. [id number] at [redacted]w[redacted]d presented for IOL Bpp 4/10  S: Pt is complete and contractions have spaced out. Nursing requesting restart of pitocin  O:  BP 124/60   Pulse 82   Temp 97.9 F (36.6 C) (Oral)   Resp 15   Ht 5\' 6"  (1.676 m)   Wt 117.7 kg   LMP 07/17/2021 (Exact Date)   SpO2 99%   BMI 41.87 kg/m  EFM: 120 bpm/Moderate variability/ 15x15 accels/ Early decels  CVE: Dilation: 10 Dilation Complete Date: 05/31/22 Dilation Complete Time: 1050 Effacement (%): 100 Cervical Position: Posterior Station: Plus 2 Presentation: Vertex Exam by:: Dr. Clement Husbands   A&P: 6 y.o. [id number] [redacted]w[redacted]d  here for IOL TOLAC as above  #Labor: Progressing well. Restaert pitocin 2x2 now, start to push #Pain: Epidural #FWB: CAT 2 #GBS positive  Shelda Pal, DO [place] Fellow, Faculty practice [place] for [place] 05/31/22  11:36 AM

## 2022-05-31 NOTE — Lactation Note (Signed)
This note was copied from a baby's chart. Lactation Consultation Note  Patient Name: Deborah House [id number] Date: 05/31/2022 Reason for consult: Initial assessment;Mother's request;Term;Breastfeeding assistance Age:34 hours  LC called to the room by the birth parent.  LC entered the room and the birth parent was attempting to latch the infant.  The infant attempted to latch the the left breast in the football position.  The infant sat at the breast and did not suck.  The birth parent stated that her nipples are sensitive.  She put nipple butter on her nipples due to them being dry.  LC did suck training to get the infant to suck on a gloved finger. LC put the infant in the cross-cradle position on the left breast.  The infant latched with tongue down and lips flanged.  He sat at the breast for a while before taking a few sucks.  He was off and on a few times.  LC continued to put the infant on the breast multiple times and he was off and on for 5 min.  LC attempted to put the infant on the right breast in the football hold, but he was uninterested in feeding.  [place] left her name on the board, congratulated the birth parent, and exited the room.   Infant Feeding Plan:  Breastfeed 8+ times in 24 hours according to feeding cues.  Hand express and feed expressed milk to the infant via a spoon.  Call [place] for assistance with breastfeeding.    Maternal Data Has patient been taught Hand Expression?: Yes Does the patient have breastfeeding experience prior to this delivery?: Yes How long did the patient breastfeed?: She attempted with her other children, but was unsuccessful  Feeding Mother's Current Feeding Choice: Breast Milk  LATCH Score Latch: Repeated attempts needed to sustain latch, nipple held in mouth throughout feeding, stimulation needed to elicit sucking reflex.  Audible Swallowing: A few with stimulation  Type of Nipple: Flat  Comfort (Breast/Nipple): Soft /  non-tender  Hold (Positioning): Assistance needed to correctly position infant at breast and maintain latch.  LATCH Score: 6   Lactation Tools Discussed/Used    Interventions Interventions: Assisted with latch;Hand express;Breast massage;Adjust position;Support pillows;Education;LC Services brochure  Discharge Pump: Personal;Hands Free  Consult Status Consult Status: Follow-up Date: 06/01/22 Follow-up type: In-patient    Deborah House 05/31/2022, 6:41 PM

## 2022-05-31 NOTE — Anesthesia Procedure Notes (Addendum)
Epidural Patient location during procedure: OB Start time: 05/30/2022 11:05 PM End time: 05/30/2022 11:15 PM  Staffing Anesthesiologist: Freddrick March, MD Performed: anesthesiologist   Preanesthetic Checklist Completed: patient identified, IV checked, risks and benefits discussed, monitors and equipment checked, pre-op evaluation and timeout performed  Epidural Patient position: sitting Prep: DuraPrep and site prepped and draped Patient monitoring: continuous pulse ox, blood pressure, heart rate and cardiac monitor Approach: midline Location: L3-L4 Injection technique: LOR air  Needle:  Needle type: Tuohy  Needle gauge: 17 G Needle length: 9 cm Needle insertion depth: 6 cm Catheter type: closed end flexible Catheter size: 19 Gauge Catheter at skin depth: 11 cm Test dose: negative  Assessment Sensory level: T8 Events: blood not aspirated, no cerebrospinal fluid, injection not painful, no injection resistance, no paresthesia and negative IV test  Additional Notes Patient identified. Risks/Benefits/Options discussed with patient including but not limited to bleeding, infection, nerve damage, paralysis, failed block, incomplete pain control, headache, blood pressure changes, nausea, vomiting, reactions to medication both or allergic, itching and postpartum back pain. Confirmed with bedside nurse the patient's most recent platelet count. Confirmed with patient that they are not currently taking any anticoagulation, have any bleeding history or any family history of bleeding disorders. Patient expressed understanding and wished to proceed. All questions were answered. Sterile technique was used throughout the entire procedure. Please see nursing notes for vital signs. Test dose was given through epidural catheter and negative prior to continuing to dose epidural or start infusion. Warning signs of high block given to the patient including shortness of breath, tingling/numbness in  hands, complete motor block, or any concerning symptoms with instructions to call for help. Patient was given instructions on fall risk and not to get out of bed. All questions and concerns addressed with instructions to call with any issues or inadequate analgesia.  Reason for block:procedure for pain

## 2022-05-31 NOTE — Progress Notes (Signed)
Labor Progress Note Deborah House is a 32 y.o. [id number] at 101w5d presented for IOL due to BPP 4/10. She has had 2 prior C-sections, first was for placenta previa, 2nd was an elective repeat C-section. This pregnancy is complicated by pre-eclampsia w/o SF, anemia, LGA baby with EFW 4150g. 91%ile and AC >99%ile  S: patient is asleep.  Much more comfortable now, with epidural in place. No cervical change in the last 6 hoiurs.  O:  BP 117/77   Pulse 67   Temp 97.6 F (36.4 C) (Oral)   Resp 16   Ht 5\' 6"  (1.676 m)   Wt 117.7 kg   LMP 07/17/2021 (Exact Date)   SpO2 99%   BMI 41.87 kg/m   EFM: 120bpm, moderate variability, +accels, intermittent decels - early decels at this time  CVE: Dilation: 6 Effacement (%): 60, 70 Cervical Position: Posterior Station: -2, -1 Presentation: Vertex Exam by:: [name], RN    A&P: 32 y.o. [id number] [redacted]w[redacted]d IOL for BPP 4/10.  #Labor: No change since last check. Still 6 cm. Cervix not really thinning out. On Pit 18 units (max 20 units for her, given C/S X2).  Given inadequate contractions, an on pitocin for about 36hrs, will proceed on a pitocin break for 1hr. Tums 1000mg  administered.  Restart pitocin at 10 units at about 0300 #Pain: Epidural in place #FWB: Cat 1 #GBS negative  Pre-eclampsia w/o SF - blood pressures: intermittently high DBP, but overall well controlled. None in severe range.  Tyashia Morrisette Sherrilyn Rist, MD 2:04 AM

## 2022-05-31 NOTE — Lactation Note (Signed)
This note was copied from a baby's chart. Lactation Consultation Note  Patient Name: Deborah House [id number] Date: 05/31/2022 Reason for consult: L&D Initial assessment;Term;Breastfeeding assistance Age:34 hours  LC entered the room and the infant was STS with the supporting parent.  The RN stated that the infant was putting his hands in his mouth.  LC attempted to assist the birth parent with putting the infant to the right breast in the football position. The infant did not latch after multiple attempts.  The infant was placed STS with the birth parent.  The birth parent is aware that she will be seen on the [facility].   Maternal Data Does the patient have breastfeeding experience prior to this delivery?: Yes How long did the patient breastfeed?: She attempted with her other children, but was unsuccessful  Feeding Mother's Current Feeding Choice: Breast Milk  LATCH Score Latch: Too sleepy or reluctant, no latch achieved, no sucking elicited.  Audible Swallowing: None  Type of Nipple: Flat  Comfort (Breast/Nipple): Soft / non-tender  Hold (Positioning): No assistance needed to correctly position infant at breast.  LATCH Score: 5   Lactation Tools Discussed/Used    Interventions Interventions: Support pillows;Assisted with latch  Discharge Pump: Personal;Hands Free  Consult Status Consult Status: Follow-up from L&D Date: 05/31/22 Follow-up type: In-patient    Lysbeth Penner 05/31/2022, 3:44 PM

## 2022-05-31 NOTE — Progress Notes (Signed)
Labor Progress Note Deborah House is a 32 y.o. [id number] at [redacted]w[redacted]d presented for IOL due to BPP 4/10. She has had 2 prior C-sections, first was for placenta previa, 2nd was an elective repeat C-section. This pregnancy is complicated by pre-eclampsia w/o SF, anemia, LGA baby with EFW 4150g. 91%ile and AC >99%ile  S: patient seen. No new concerns. She is comfortable with her epidural.  O:  BP 132/75   Pulse 91   Temp 98.8 F (37.1 C) (Axillary)   Resp 16   Ht 5\' 6"  (1.676 m)   Wt 117.7 kg   LMP 07/17/2021 (Exact Date)   SpO2 99%   BMI 41.87 kg/m   EFM: 115bpm, moderate variability, +accels, no decels. Toco: ~ 70-100 MVU in 10 mins.  CVE: Dilation: 6 Effacement (%): 80 Cervical Position: Posterior Station: -2, -1 Presentation: Vertex Exam by:: [name], RN    A&P: 32 y.o. [id number] [redacted]w[redacted]d IOL for BPP 4/10.  #Labor: minimal change since last check. Cervix still at 6cm, but does appear better thinned out, and fetal head is applied to cervix at this time. Small caput noted. Discussed concern about lack of progress in cervical dilation with patient. She has been about 6cm for 12 hours at this time, and hasn't had adequate contractions despite pitocin augmentation. Concern for arrest of dilation. - plan to recheck after sign out in 2 hrs, and if still the same, may need to consider a c-section. She expresses understanding of this and agrees with this plan. #Pain: Epidural in place #FWB: Cat 1 #GBS negative  Pre-eclampsia w/o SF - elevated blood pressure, but not in severe range.   [name] MD MPH OB Fellow, [place] for [place] 05/31/2022

## 2022-05-31 NOTE — Discharge Summary (Signed)
Postpartum Discharge Summary     Patient Name: Deborah House DOB: 1991-03-02 MRN: [id number]  Date of admission: 05/29/2022 Delivery date:05/31/2022  Delivering provider: Shelda Pal  Date of discharge: 06/02/2022  Admitting diagnosis: Encounter for induction of labor [Z34.90] Intrauterine pregnancy: [redacted]w[redacted]d     Secondary diagnosis:  Principal Problem:   VBAC (vaginal birth after Cesarean) Active Problems:   Obesity affecting pregnancy   Anemia affecting pregnancy   Rh negative state in antepartum period   History of 2 cesarean sections   Supervision of high-risk pregnancy   Shoulder dystocia during labor and delivery   Obstetrical laceration  Additional problems: preE w/o SF    Discharge diagnosis: Term Pregnancy Delivered                                              Post partum procedures: n/a Augmentation: AROM, Pitocin, and IP Foley Complications: None  Hospital course: Induction of Labor With Vaginal Delivery   32 y.o. yo [id number] at [redacted]w[redacted]d was admitted to the hospital 05/29/2022 for induction of labor.  Indication for induction:  BPP 4/10 .  Patient had an labor course complicated by prolonged 2nd stage of labor with a 50 second shoulder dystocia. Membrane Rupture Time/Date: 9:22 AM ,05/30/2022   Delivery Method:VBAC, Spontaneous  Episiotomy: None  Lacerations:  Sulcus  Details of delivery can be found in separate delivery note.  Patient had a postpartum course that was uncomplicated. Patient is discharged home 06/02/22.  Newborn Data: Birth date:05/31/2022  Birth time:2:35 PM  Gender:Female  Living status:Living  Apgars:8 ,9  Weight:3690 g   Magnesium Sulfate received: No BMZ received: No Rhophylac:N/A MMR:N/A T-DaP:Given prenatally Flu: No Transfusion:No  Physical exam  Vitals:   06/01/22 0403 06/01/22 1445 06/01/22 2254 06/02/22 0459  BP: (!) 114/52 106/61 (!) 104/58 122/65  Pulse: 89 89 88 80  Resp: 18 18 17 16   Temp: 98.2 F (36.8 C)  98.3 F (36.8 C) 98.1 F (36.7 C) 98 F (36.7 C)  TempSrc: Axillary Oral Oral Oral  SpO2:      Weight:      Height:       General: alert, cooperative, and no distress Lochia: appropriate Uterine Fundus: firm Incision: N/A DVT Evaluation: No evidence of DVT seen on physical exam. Labs: Lab Results  Component Value Date   WBC 17.4 (H) 06/01/2022   HGB 9.0 (L) 06/01/2022   HCT 27.4 (L) 06/01/2022   MCV 83.3 06/01/2022   PLT 223 06/01/2022      Latest Ref Rng & Units 05/30/2022    3:14 AM  CMP  Glucose 70 - 99 mg/dL 93   BUN 6 - 20 mg/dL <5   Creatinine 0.44 - 1.00 mg/dL 0.60   Sodium 135 - 145 mmol/L 133   Potassium 3.5 - 5.1 mmol/L 3.7   Chloride 98 - 111 mmol/L 106   CO2 22 - 32 mmol/L 21   Calcium 8.9 - 10.3 mg/dL 8.7   Total Protein 6.5 - 8.1 g/dL 6.2   Total Bilirubin 0.3 - 1.2 mg/dL 1.2   Alkaline Phos 38 - 126 U/L 58   AST 15 - 41 U/L 17   ALT 0 - 44 U/L 12    Edinburgh Score:    05/31/2022    5:00 PM  Edinburgh Postnatal Depression Scale Screening Tool  I have been able to  laugh and see the funny side of things. 0  I have looked forward with enjoyment to things. 0  I have blamed myself unnecessarily when things went wrong. 0  I have been anxious or worried for no good reason. 0  I have felt scared or panicky for no good reason. 0  Things have been getting on top of me. 2  I have been so unhappy that I have had difficulty sleeping. 0  I have felt sad or miserable. 0  I have been so unhappy that I have been crying. 1  The thought of harming myself has occurred to me. 0  Edinburgh Postnatal Depression Scale Total 3     After visit meds:  Allergies as of 06/02/2022   No Known Allergies      Medication List     STOP taking these medications    DICLEGIS PO       TAKE these medications    acetaminophen 500 MG tablet Commonly known as: TYLENOL Take 1,000 mg by mouth as needed for headache.   ferrous sulfate 325 (65 FE) MG tablet Take 1  tablet (325 mg total) by mouth daily with breakfast. Start taking on: June 03, 2022   furosemide 20 MG tablet Commonly known as: Lasix Take 1 tablet (20 mg total) by mouth daily for 5 days.   ibuprofen 600 MG tablet Commonly known as: ADVIL Take 1 tablet (600 mg total) by mouth every 6 (six) hours.   multivitamin-prenatal 27-0.8 MG Tabs tablet Take 2 tablets by mouth at bedtime.         Discharge home in stable condition Infant Feeding: Breast Infant Disposition:home with mother Discharge instruction: per After Visit Summary and Postpartum booklet. Activity: Advance as tolerated. Pelvic rest for 6 weeks.  Diet: routine diet Future Appointments: Future Appointments  Date Time Provider [place]  06/15/2022  3:55 PM Imagene Riches, CNM [facility] None  07/12/2022 10:55 AM Imagene Riches, CNM [facility] None  08/27/2022  2:45 PM [facility] None  08/30/2022  2:00 PM Lloyd Huger, MD [facility] None  08/30/2022  2:30 PM [facility] INFUSION CHAIR 2 [facility] None   Follow up Visit: Message sent to [facility] 1/11  Please schedule this patient for a In person postpartum visit in 6 weeks with the following provider: Any provider. Additional Postpartum F/U: n/a   Low risk pregnancy complicated by:  n/a Delivery mode:  VBAC, Spontaneous  Anticipated Birth Control:   vasectomy   06/02/2022 Naaman Plummer Autry-Lott, DO

## 2022-06-01 LAB — CBC
HCT: 27.4 % — ABNORMAL LOW (ref 36.0–46.0)
Hemoglobin: 9 g/dL — ABNORMAL LOW (ref 12.0–15.0)
MCH: 27.4 pg (ref 26.0–34.0)
MCHC: 32.8 g/dL (ref 30.0–36.0)
MCV: 83.3 fL (ref 80.0–100.0)
Platelets: 223 10*3/uL (ref 150–400)
RBC: 3.29 MIL/uL — ABNORMAL LOW (ref 3.87–5.11)
RDW: 14.4 % (ref 11.5–15.5)
WBC: 17.4 10*3/uL — ABNORMAL HIGH (ref 4.0–10.5)
nRBC: 0 % (ref 0.0–0.2)

## 2022-06-01 MED ORDER — SODIUM CHLORIDE 0.9 % IV SOLN
500.0000 mg | Freq: Once | INTRAVENOUS | Status: AC
  Administered 2022-06-01: 500 mg via INTRAVENOUS
  Filled 2022-06-01: qty 500

## 2022-06-01 MED ORDER — RHO D IMMUNE GLOBULIN 1500 UNIT/2ML IJ SOSY
300.0000 ug | PREFILLED_SYRINGE | Freq: Once | INTRAMUSCULAR | Status: AC
  Administered 2022-06-01: 300 ug via INTRAVENOUS
  Filled 2022-06-01: qty 2

## 2022-06-01 NOTE — Anesthesia Postprocedure Evaluation (Signed)
Anesthesia Post Note  Patient: Deborah House  Procedure(s) Performed: AN AD [place]     Patient location during evaluation: Mother Baby Anesthesia Type: Epidural Level of consciousness: awake and alert Pain management: pain level controlled Vital Signs Assessment: post-procedure vital signs reviewed and stable Respiratory status: spontaneous breathing, nonlabored ventilation and respiratory function stable Cardiovascular status: stable Postop Assessment: no headache, no backache, epidural receding, no apparent nausea or vomiting, patient able to bend at knees, able to ambulate and adequate PO intake Anesthetic complications: no   No notable events documented.  Last Vitals:  Vitals:   05/31/22 1830 06/01/22 0403  BP: 117/66 (!) 114/52  Pulse: 91 89  Resp: 18 18  Temp: 36.8 C 36.8 C  SpO2: 99%     Last Pain:  Vitals:   06/01/22 0403  TempSrc: Axillary  PainSc:    Pain Goal:                   [employer]

## 2022-06-01 NOTE — Progress Notes (Signed)
POSTPARTUM PROGRESS NOTE  Post Partum Day #1  Subjective:  Deborah House is a 32 y.o. [phone] s/p VBAC at [redacted]w[redacted]d.  No acute events overnight.  Pt denies problems with ambulating, voiding or po intake.  She denies nausea or vomiting.  Pain is well controlled.  She has had flatus. She has had bowel movement.  Lochia Moderate.   Objective: Blood pressure (!) 114/52, pulse 89, temperature 98.2 F (36.8 C), temperature source Axillary, resp. rate 18, height 5\' 6"  (1.676 m), weight 117.7 kg, last menstrual period 07/17/2021, SpO2 99 %, unknown if currently breastfeeding.  Physical Exam:  General: alert, cooperative and no distress Chest: no respiratory distress Heart:regular rate, distal pulses intact Abdomen: soft, nontender,  Uterine Fundus: firm, appropriately tender DVT Evaluation: No calf swelling or tenderness Extremities: No peripheral edema Skin: warm, dry  Recent Labs    05/31/22 1525 06/01/22 0422  HGB 10.6* 9.0*  HCT 32.3* 27.4*    Assessment/Plan: Deborah House is a 32 y.o. [id number] s/p VBAC at [redacted]w[redacted]d   PPD#1 - Doing well, h/o anemia during pregnancy requiring IV iron x 9. Post delivery Hgb 9.0 (baseline 10.7). Will give IV iron x 1.  Contraception: Vasectomy Feeding: Breast and bottle with donor breast milk Dispo: Plan for discharge 1/13 RhD alloimmunization: Rhogam ordered    LOS: 3 days   Colletta [place], MD 06/01/2022, 8:48 AM

## 2022-06-01 NOTE — Lactation Note (Signed)
This note was copied from a baby's chart. Lactation Consultation Note  Patient Name: Boy Kinlie Janice [id number] Date: 06/01/2022 Reason for consult: Follow-up assessment;Term;1st time breastfeeding Age:34 hours   P3: Term infant at 39+6 weeks Feeding preference: Breast/donor milk Weight loss: 3%  Baby was in the nursery receiving a circumcision when I arrived.  Mother has not attempted latching since last evening.  Reviewed breast feeding basics and milk "coming to volume." Emphasized the importance of latching to the breast with every feeding before providing supplementation.  Encouraged lots of STS, breast massage and hand expression.  Mother demonstrated hand expression; no drops noted at this time.  Suggested she call her RN/LC for latch assistance as needed.    Mother will begin to latch with every feeding.  Suggested she increase volume supplementation amounts to 15-30 mls if he does not breast feed.  Mother verbalized understanding.  No support person present at this time.   Maternal Data Has patient been taught Hand Expression?: Yes Does the patient have breastfeeding experience prior to this delivery?: No  Feeding Mother's Current Feeding Choice: Breast Milk and Donor Milk Nipple Type: Slow - flow  LATCH Score                    Lactation Tools Discussed/Used    Interventions Interventions: Breast feeding basics reviewed;Education  Discharge Pump: Personal ([place])  Consult Status Consult Status: Follow-up Date: 06/02/22 Follow-up type: In-patient    Riane Rung R Aneeka Bowden 06/01/2022, 10:52 AM

## 2022-06-01 NOTE — Lactation Note (Signed)
This note was copied from a baby's chart. Lactation Consultation Note  Patient Name: Deborah House [id number] Date: 06/01/2022 Reason for consult: Follow-up assessment Age:34 hours   LC Follow Up Consult:  Mother requested latch assistance.  Attempted to latch "[name]" in the football hold, however, he was not at all interested in initiating a suck.  He became fussy and pushed back off the breast.  Calmed him and attempted again with the same results.  Suggested mother supplement with formula and try latching again with the next feeding.  Reminded her that he should wake up more now that he is > 24 hours old.  He may cluster feed tonight.     Maternal Data    Feeding    LATCH Score Latch: Too sleepy or reluctant, no latch achieved, no sucking elicited.  Audible Swallowing: None  Type of Nipple: Everted at rest and after stimulation  Comfort (Breast/Nipple): Soft / non-tender  Hold (Positioning): Assistance needed to correctly position infant at breast and maintain latch.  LATCH Score: 5   Lactation Tools Discussed/Used    Interventions Interventions: Breast feeding basics reviewed;Assisted with latch;Skin to skin;Breast massage;Hand express;Breast compression;Position options;Support pillows;Adjust position;Education  Discharge    Consult Status Consult Status: Follow-up Date: 06/02/22 Follow-up type: In-patient    Little Ishikawa 06/01/2022, 4:13 PM

## 2022-06-01 NOTE — Progress Notes (Signed)
Patient has complained of a headache since around 0945 this am.  Instructed patient to eat something, drink caffeine if she normally does so, and gave po tylenol.  Tylenol helped "a little bit" per patient.  Instructed patient to lie flat and let me know if her headache improved.  Upon lying flat patient states that her headache did improve and didn't completely go away, but significantly improved.  Notified Dr. Lanetta Inch of my patient's current situation and she will come evaluate her when she is available. Will continue to monitor.

## 2022-06-01 NOTE — Progress Notes (Signed)
Called by RN to evaluate headache. Per pt, headache started earlier today while she was up walking around. She describes it as pain over the left frontal area. Does not radiate. Gets better with Tylenol and Motrin. She does say it gets better when she lies flat. She is able to get up to the restroom and take care of baby without any problems. She is a coffee drinker and has not had coffee since Monday. She was sitting up when I entered room, but during our conversation, she sat up even further without any suggestion, and her headache did not get worse. Her epidural was uncomplicated without a known dural puncture.  Despite being somewhat postural in nature, this headache is not a classic PDPH. I told her to continue with oral hydration, caffeine, Motrin, and Tylenol. We will reassess tomorrow. She will call us if her headache worsens.

## 2022-06-02 LAB — RH IG WORKUP (INCLUDES ABO/RH)
Fetal Screen: NEGATIVE
Gestational Age(Wks): 39.6
Unit division: 0

## 2022-06-02 MED ORDER — FERROUS SULFATE 325 (65 FE) MG PO TABS: 325.0000 mg | ORAL_TABLET | Freq: Every day | ORAL | 0 refills | Status: DC

## 2022-06-02 MED ORDER — FERROUS SULFATE 325 (65 FE) MG PO TABS: 325.0000 mg | ORAL_TABLET | Freq: Every day | ORAL | Status: DC

## 2022-06-02 MED ORDER — FUROSEMIDE 20 MG PO TABS: 20.0000 mg | ORAL_TABLET | Freq: Every day | ORAL | 0 refills | Status: DC

## 2022-06-02 MED ORDER — IBUPROFEN 600 MG PO TABS: 600.0000 mg | ORAL_TABLET | Freq: Four times a day (QID) | ORAL | 0 refills | Status: DC

## 2022-06-02 NOTE — Lactation Note (Signed)
This note was copied from a baby's chart. Lactation Consultation Note  Patient Name: Deborah House [id number] Date: 06/02/2022  Reason for consult: Follow-up assessment  Age:34 hours  Mother reports she has tried to latch baby but he sucks a few times and he is not sustaining the latch. Baby is being fed formula and took a full bottle at his last feeding. Mother has a hands free breast pump for home use and plans to pump and give EBM by bottle if baby is not able to breastfeed. Discussed pumping 8 times/ 24 hours to establish and maintain milk supply.   This is mother's 3rd child. She reports attempting to the breast feed her other 2 children, however, was unsuccessful due to low milk supply and sore nipples. She is aware and has resources for OP Lactation services and support.  Offered to return to assist with latch at next feeding and mother to call, if desires.   Feeding Nipple Type: Slow - flow    Interventions Interventions: Education  Discharge Discharge Education: Engorgement and breast care;Warning signs for feeding baby Pump: Hands Free ([place])  Consult Status Consult Status: PRN Date: 06/02/22 Follow-up type: In-patient    Deborah House M 06/02/2022, 8:42 AM

## 2022-06-04 ENCOUNTER — Telehealth: Payer: Self-pay

## 2022-06-04 NOTE — Patient Outreach (Signed)
  Care Coordination [facility] Note Transition Care Management Follow-up Telephone Call Date of discharge and from where: 06/02/22 Deborah House dx vaginal birth after cesarean  How have you been since you were released from the hospital? I have been doing good.  I am still trying to pump my milk as it has not come in yet. The baby is doing good and he has a doctors appointment tomorrow Any questions or concerns? No  Items Reviewed: Did the pt receive and understand the discharge instructions provided? Yes  Medications obtained and verified? Yes  Other? No  Any new allergies since your discharge? No  Dietary orders reviewed? No Do you have support at home? Yes husband  Home Care and Equipment/Supplies: Were home health services ordered? not applicable If so, what is the name of the agency? N/a  Has the agency set up a time to come to the patient's home? not applicable Were any new equipment or medical supplies ordered?  No What is the name of the medical supply agency? N/a Were you able to get the supplies/equipment? not applicable Do you have any questions related to the use of the equipment or supplies? No  Functional Questionnaire: (I = Independent and D = Dependent) ADLs: I  Bathing/Dressing- I  Meal Prep- I  Eating- I  Maintaining continence- I  Transferring/Ambulation- I  Managing Meds- I  Follow up appointments reviewed:  PCP Hospital f/u appt confirmed? Yes  Scheduled to see Deborah House CNM on 06/15/22 @ 3:55 PM. [place] [facility] f/u appt confirmed? No  Scheduled to see  on  @ . Are transportation arrangements needed? No  If their condition worsens, is the pt aware to call PCP or go to the Emergency Dept.? Yes Was the patient provided with contact information for the PCP's office or ED? Yes Was to pt encouraged to call back with questions or concerns? Yes  SDOH assessments and interventions completed:   Yes SDOH Interventions Today    Flowsheet Row Most Recent  Value  SDOH Interventions   Food Insecurity Interventions Intervention Not Indicated  Transportation Interventions Intervention Not Indicated       Care Coordination Interventions:  Reviewed s/sx to call provider and to keep follow up appointment    Encounter Outcome:  Pt. Visit Completed   [name] RN, [facility], [place] Management Coordinator [facility] [phone]

## 2022-06-05 DIAGNOSIS — R17 Unspecified jaundice: Secondary | ICD-10-CM | POA: Diagnosis not present

## 2022-06-07 ENCOUNTER — Telehealth (HOSPITAL_COMMUNITY): Payer: Self-pay | Admitting: *Deleted

## 2022-06-07 NOTE — Telephone Encounter (Signed)
Attempted Hospital Discharge Follow-Up Call.  Left voice mail requesting that patient return RN's phone call if patient has any concerns or questions regarding herself or her baby.  

## 2022-06-12 ENCOUNTER — Encounter: Payer: Self-pay | Admitting: Obstetrics and Gynecology

## 2022-06-12 ENCOUNTER — Ambulatory Visit (INDEPENDENT_AMBULATORY_CARE_PROVIDER_SITE_OTHER): Payer: BC Managed Care – PPO | Admitting: Obstetrics and Gynecology

## 2022-06-12 ENCOUNTER — Telehealth: Payer: Self-pay | Admitting: Obstetrics

## 2022-06-12 VITALS — BP 110/52 | HR 81 | Resp 16 | Ht 66.0 in | Wt 238.0 lb

## 2022-06-12 DIAGNOSIS — Z8759 Personal history of other complications of pregnancy, childbirth and the puerperium: Secondary | ICD-10-CM

## 2022-06-12 DIAGNOSIS — Z1332 Encounter for screening for maternal depression: Secondary | ICD-10-CM

## 2022-06-12 DIAGNOSIS — O34219 Maternal care for unspecified type scar from previous cesarean delivery: Secondary | ICD-10-CM

## 2022-06-12 NOTE — Telephone Encounter (Signed)
Left message for patient to call office back to r/s appt with MMF on 06/15/22. Mychart message sent also

## 2022-06-12 NOTE — Patient Instructions (Signed)

## 2022-06-12 NOTE — Progress Notes (Signed)
History of Present Illness:   Deborah House is a 32 y.o. [phone] female who presents for a 2 week postpartum visit for mood check. She is s/p a spontaneous vaginal delivery after Cesarean (VBAC), performed at [facility] [place] [facility] in [place]. Patient was induced due to non-reassuring antenatal testing (BPP 4/10 at MFM appointment that day).  The delivery was at 39.6 gestational weeks. Labor was complicated by pre-eclampsia (without severe features). Delivery complicated by shoulder dystocia.   Postpartum course has been well so far. Baby is feeding by bottle, formula: Enfamil. Bleeding: no major bleeding, but reports  brownish discharge with odor. She notes that her sutures have been itching x 2 days. Postpartum depression screening: negative.  EDPS score is 5.      The following portions of the patient's history were reviewed and updated as appropriate: allergies, current medications, past family history, past medical history, past social history, past surgical history, and problem list.   Observations/Objective:   Blood pressure (!) 110/52, pulse 81, resp. rate 16, height 5\' 6"  (1.676 m), weight 238 lb (108 kg), last menstrual period 07/17/2021, unknown if currently breastfeeding. Body mass index is 38.41 kg/m.   Gen App: NAD Abd: soft, non-tender, bowel sounds normal.  Pelvis: external genitalia normal, rectovaginal septum normal.  Vagina with small amount of yellow-white discharge.  Vaginal sutures visible, one in particular appears broken, with residual suture material ~ 3 cm in length, excised today. Bimanual exam not performed.  Psych: normal speech, affect. Good mood.        05/31/2022    5:00 PM 10/04/2021    9:24 AM 08/19/2019    2:00 PM 07/09/2019    3:02 PM  Edinburgh Postnatal Depression Scale Screening Tool  I have been able to laugh and see the funny side of things. 0 0 0 0  I have looked forward with enjoyment to things. 0 0 0 0  I have blamed myself  unnecessarily when things went wrong. 0 2 0 0  I have been anxious or worried for no good reason. 0 2 2 1   I have felt scared or panicky for no good reason. 0 0 0 0  Things have been getting on top of me. 2 2 2 1   I have been so unhappy that I have had difficulty sleeping. 0 0 1 0  I have felt sad or miserable. 0 0 1 0  I have been so unhappy that I have been crying. 1 0 1 0  The thought of harming myself has occurred to me. 0 0 0 0  Edinburgh Postnatal Depression Scale Total 3 6 7 2       Assessment and Plan:   1. Encounter for screening for maternal depression - Screening Negative today. Will rescreen at 6 week postpartum visit. Overall doing well.    2. Postpartum state, VBAC - Overall doing well. Continue routine postpartum home care.  - Vaginal repair healing well, advised that discharge was most likely due to healing.  No odor detected on today's exam. Itching most likely due to long piece of suture material present.  - Contraception: Plans for partner vasectomy. Has questions about scheduling, given info for local Urology office. Will use condoms during interim.    3. Pre-eclampsia (mild) - BPs today appear normal. Reiterated warning signs.     Follow up in 4-5 weeks for final postpartum visit.   Rubie Maid, MD [facility]

## 2022-06-15 ENCOUNTER — Ambulatory Visit: Payer: BC Managed Care – PPO | Admitting: Obstetrics

## 2022-06-19 NOTE — Telephone Encounter (Signed)
Patient seen on 1/23 with Dr. Marcelline Mates

## 2022-07-12 ENCOUNTER — Ambulatory Visit (INDEPENDENT_AMBULATORY_CARE_PROVIDER_SITE_OTHER): Payer: BC Managed Care – PPO | Admitting: Obstetrics

## 2022-07-12 ENCOUNTER — Encounter: Payer: Self-pay | Admitting: Obstetrics

## 2022-07-16 ENCOUNTER — Encounter: Payer: Self-pay | Admitting: Obstetrics

## 2022-07-16 NOTE — Progress Notes (Signed)
Postpartum Visit  Chief Complaint:  Chief Complaint  Patient presents with   Postpartum Care    History of Present Illness: Patient is a 32 y.o. [id number] presents for postpartum visit.  Date of delivery: 05/31/2022 Type of delivery: Vaginal delivery - VBAC, with 50 second shoulder dystocia Episiotomy No.  Laceration: yes, bilateral sulcus tears  Pregnancy or labor problems:  induction of labor due to BPP of 4 out of 8. Any problems since the delivery:  no  Newborn Details:  SINGLETON :  1. Baby's name: boy. Birth weight: 3690 gms Maternal Details:  Breast Feeding:  no Post partum depression/anxiety noted:  no Edinburgh Post-Partum Depression Score:  3  Date of last PAP: 10/10/2021  normal   Past Medical History:  Diagnosis Date   Abdominal pain    Amenorrhea    Anemia    Anemia affecting pregnancy 05/15/2017   Hgb 8.0 at 28 weeks '[X]'$  hematology referral   Chlamydia 2012   tx'd   Family history of ovarian cancer 07/2015   genetic testing letter sent; 4/22 PALB2 positive   Genetic testing of female 08/2020   MyRisk PALB2 positive, with 2 MSH3, POLE, and RNF43 VUS   Hemorrhoids    Low-lying placenta in third trimester    Missed abortion 06/01/2014   RPH   Monoallelic mutation of PALB2 gene 08/2020   MyRisk; increased risk of breast/ovar/pancreatic cancer    Past Surgical History:  Procedure Laterality Date   CESAREAN SECTION N/A 06/27/2017   Procedure: CESAREAN SECTION;  Surgeon: Malachy Mood, MD;  Location: [facility] ORS;  Service: Obstetrics;  Laterality: N/A;   CESAREAN SECTION N/A 07/09/2019   Procedure: CESAREAN SECTION;  Surgeon: Gae Dry, MD;  Location: [facility] ORS;  Service: Obstetrics;  Laterality: N/A;   DILATION AND CURETTAGE OF UTERUS  06/01/2014   EYE SURGERY Left    when an infant   MOUTH SURGERY Left 2017   root canal    Prior to Admission medications   Medication Sig Start Date End Date Taking? Authorizing Provider  acetaminophen (TYLENOL)  500 MG tablet Take 1,000 mg by mouth as needed for headache.    [provider]  ibuprofen (ADVIL) 600 MG tablet Take 1 tablet (600 mg total) by mouth every 6 (six) hours. 06/02/22   Autry-Lott, Naaman Plummer, DO  Prenatal Vit-Fe Fumarate-FA (MULTIVITAMIN-PRENATAL) 27-0.8 MG TABS tablet Take 2 tablets by mouth at bedtime.    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  Tobacco Use   Smoking status: Never   Smokeless tobacco: Never  Vaping Use   Vaping Use: Never used  Substance and Sexual Activity   Alcohol use: No   Drug use: No   Sexual activity: Yes    Birth control/protection: None  Other Topics Concern   Not on file  Social History Narrative   Not on file   Social Determinants of Health   Financial Resource Strain: Low Risk  (10/04/2021)   Overall Financial Resource Strain (CARDIA)    Difficulty of Paying Living Expenses: Not very hard  Food Insecurity: No Food Insecurity (06/04/2022)   Hunger Vital Sign    Worried About Running Out of Food in the Last Year: Never true  Ran Out of Food in the Last Year: Never true  Transportation Needs: No Transportation Needs (06/04/2022)   PRAPARE - Hydrologist (Medical): No    Lack of Transportation (Non-Medical): No  Physical Activity: Insufficiently Active (10/04/2021)   Exercise Vital Sign    Days of Exercise per Week: 2 days    Minutes of Exercise per Session: 20 min  Stress: No Stress Concern Present (10/04/2021)   [place]    Feeling of Stress : Not at all  Social Connections: Unknown (10/04/2021)   Social Connection and Isolation Panel [NHANES]    Frequency of Communication with  Friends and Family: Once a week    Frequency of Social Gatherings with Friends and Family: Twice a week    Attends Religious Services: Not on Advertising copywriter or Organizations: No    Attends Archivist Meetings: Never    Marital Status: Married  Human resources officer Violence: Not At Risk (10/04/2021)   Humiliation, Afraid, Rape, and Kick questionnaire    Fear of Current or Ex-Partner: No    Emotionally Abused: No    Physically Abused: No    Sexually Abused: No    Family History  Problem Relation Age of Onset   Anemia Mother    Anemia Sister    Cancer Maternal Grandmother    Diabetes Maternal Grandmother    Ovarian cancer Maternal Grandmother 60   Asthma Neg Hx    Heart disease Neg Hx    Hypertension Neg Hx    Stroke Neg Hx     ROS   Physical Exam BP 120/88   Pulse 65   Wt 237 lb 8 oz (107.7 kg)   LMP 07/17/2021 (Exact Date)   Breastfeeding No   BMI 38.33 kg/m   OBGyn Exam   Female Chaperone present during breast and/or pelvic exam.  Assessment: 32 y.o. [id number] presenting for 6 week postpartum visit  Plan: Problem List Items Addressed This Visit   None    1) Contraception Education given regarding options for contraception, including  vasectomy . She had considered an IUD, but her partner has decided on vasectomy  2)  Pap - ASCCP guidelines and rational discussed.  Patient opts for 3 year screening interval  3) Patient underwent screening for postpartum depression with no concerns noted.  4) Follow up 1 year for routine annual exam  Imagene Riches, CNM  07/16/2022 9:56 PM   07/16/2022 9:47 PM

## 2022-07-19 ENCOUNTER — Encounter: Payer: Self-pay | Admitting: Oncology

## 2022-08-20 ENCOUNTER — Encounter: Payer: Self-pay | Admitting: Oncology

## 2022-08-27 ENCOUNTER — Encounter: Payer: Self-pay | Admitting: Obstetrics

## 2022-08-27 ENCOUNTER — Inpatient Hospital Stay: Payer: BC Managed Care – PPO

## 2022-08-29 ENCOUNTER — Inpatient Hospital Stay: Payer: BC Managed Care – PPO

## 2022-08-30 ENCOUNTER — Inpatient Hospital Stay: Payer: BC Managed Care – PPO

## 2022-08-30 ENCOUNTER — Inpatient Hospital Stay: Payer: BC Managed Care – PPO | Admitting: Oncology

## 2022-09-07 ENCOUNTER — Inpatient Hospital Stay: Payer: BC Managed Care – PPO | Attending: Oncology

## 2022-09-07 DIAGNOSIS — Z79899 Other long term (current) drug therapy: Secondary | ICD-10-CM | POA: Diagnosis not present

## 2022-09-07 DIAGNOSIS — O99012 Anemia complicating pregnancy, second trimester: Secondary | ICD-10-CM

## 2022-09-07 DIAGNOSIS — D509 Iron deficiency anemia, unspecified: Secondary | ICD-10-CM | POA: Diagnosis not present

## 2022-09-07 LAB — CBC
HCT: 44.8 % (ref 36.0–46.0)
Hemoglobin: 14 g/dL (ref 12.0–15.0)
MCH: 25 pg — ABNORMAL LOW (ref 26.0–34.0)
MCHC: 31.3 g/dL (ref 30.0–36.0)
MCV: 80 fL (ref 80.0–100.0)
Platelets: 313 10*3/uL (ref 150–400)
RBC: 5.6 MIL/uL — ABNORMAL HIGH (ref 3.87–5.11)
RDW: 13.1 % (ref 11.5–15.5)
WBC: 8.4 10*3/uL (ref 4.0–10.5)
nRBC: 0 % (ref 0.0–0.2)

## 2022-09-07 LAB — IRON AND TIBC
Iron: 87 ug/dL (ref 28–170)
Saturation Ratios: 25 % (ref 10.4–31.8)
TIBC: 354 ug/dL (ref 250–450)
UIBC: 267 ug/dL

## 2022-09-07 LAB — FERRITIN: Ferritin: 100 ng/mL (ref 11–307)

## 2022-09-12 MED FILL — Iron Sucrose Inj 20 MG/ML (Fe Equiv): INTRAVENOUS | Qty: 10 | Status: AC

## 2022-09-13 ENCOUNTER — Encounter: Payer: Self-pay | Admitting: Oncology

## 2022-09-13 ENCOUNTER — Inpatient Hospital Stay: Payer: BC Managed Care – PPO

## 2022-09-13 ENCOUNTER — Inpatient Hospital Stay (HOSPITAL_BASED_OUTPATIENT_CLINIC_OR_DEPARTMENT_OTHER): Payer: BC Managed Care – PPO | Admitting: Oncology

## 2022-09-13 VITALS — BP 120/74 | HR 66 | Temp 98.3°F | Resp 16 | Ht 66.0 in | Wt 249.0 lb

## 2022-09-13 DIAGNOSIS — Z79899 Other long term (current) drug therapy: Secondary | ICD-10-CM | POA: Diagnosis not present

## 2022-09-13 DIAGNOSIS — D509 Iron deficiency anemia, unspecified: Secondary | ICD-10-CM | POA: Diagnosis not present

## 2022-09-13 DIAGNOSIS — O99013 Anemia complicating pregnancy, third trimester: Secondary | ICD-10-CM

## 2022-09-13 NOTE — Progress Notes (Signed)
Concerned about elevated RBC count.

## 2022-09-13 NOTE — Progress Notes (Signed)
[facility]  [phone] [phone]  ID: Deborah House OB: 09/13/1990  MR#: [id number]  RSW#:[id number]  Patient Care Team: Maryella Shivers (Inactive) as PCP - General (Physician Assistant) Mirna Mires, CNM as PCP - OBGYN (Obstetrics) Jeralyn Ruths, MD as Consulting Physician (Oncology)   CHIEF COMPLAINT: Iron deficiency anemia in pregnancy.  INTERVAL HISTORY: Patient returns to clinic 3 months postpartum for repeat laboratory work and further evaluation.  The birth of her son was uneventful and she currently feels well and is asymptomatic. She has no neurologic complaints.  She denies any recent fevers or illnesses.  She has a good appetite and denies weight loss.  She has no chest pain, shortness of breath, cough, or hemoptysis.  She denies any nausea, vomiting, constipation, or diarrhea.  She has no melena or hematochezia.  She has no urinary complaints.  Patient offers no specific complaints today.  REVIEW OF SYSTEMS:   Review of Systems  Constitutional: Negative.  Negative for fever, malaise/fatigue and weight loss.  Respiratory: Negative.  Negative for cough and shortness of breath.   Cardiovascular: Negative.  Negative for chest pain and leg swelling.  Gastrointestinal: Negative.  Negative for abdominal pain, blood in stool and melena.  Genitourinary: Negative.  Negative for hematuria.  Musculoskeletal: Negative.  Negative for back pain.  Skin: Negative.  Negative for rash.  Neurological: Negative.  Negative for dizziness, focal weakness, weakness and headaches.  Psychiatric/Behavioral: Negative.  The patient is not nervous/anxious.     As per HPI. Otherwise, a complete review of systems is negative.  PAST MEDICAL HISTORY: Past Medical History:  Diagnosis Date   Abdominal pain    Amenorrhea    Anemia    Anemia affecting pregnancy 05/15/2017   Hgb 8.0 at 28 weeks  hematology referral   Chlamydia 2012    tx'd   Family history of ovarian cancer 07/2015   genetic testing letter sent; 4/22 PALB2 positive   Genetic testing of female 08/2020   MyRisk PALB2 positive, with 2 MSH3, POLE, and RNF43 VUS   Hemorrhoids    History of 2 cesarean sections 07/09/2019   Low-lying placenta in third trimester    Missed abortion 06/01/2014   RPH   Monoallelic mutation of PALB2 gene 08/2020   MyRisk; increased risk of breast/ovar/pancreatic cancer   Shoulder dystocia during labor and delivery 05/31/2022   G4: 50s shoulder    PAST SURGICAL HISTORY: Past Surgical History:  Procedure Laterality Date   CESAREAN SECTION N/A 06/27/2017   Procedure: CESAREAN SECTION;  Surgeon: [name], MD;  Location: [facility] ORS;  Service: Obstetrics;  Laterality: N/A;   CESAREAN SECTION N/A 07/09/2019   Procedure: CESAREAN SECTION;  Surgeon: Nadara Mustard, MD;  Location: [facility] ORS;  Service: Obstetrics;  Laterality: N/A;   DILATION AND CURETTAGE OF UTERUS  06/01/2014   EYE SURGERY Left    when an infant   MOUTH SURGERY Left 2017   root canal    FAMILY HISTORY: Family History  Problem Relation Age of Onset   Anemia Mother    Anemia Sister    Cancer Maternal Grandmother    Diabetes Maternal Grandmother    Ovarian cancer Maternal Grandmother 60   Asthma Neg Hx    Heart disease Neg Hx    Hypertension Neg Hx    Stroke Neg Hx     ADVANCED DIRECTIVES (Y/N):  N  HEALTH MAINTENANCE: Social History   Tobacco Use   Smoking status: Never  Smokeless tobacco: Never  Vaping Use   Vaping Use: Never used  Substance Use Topics   Alcohol use: No   Drug use: No     Colonoscopy:  PAP:  Bone density:  Lipid panel:  No Known Allergies   Current Outpatient Medications  Medication Sig Dispense Refill   acetaminophen (TYLENOL) 500 MG tablet Take 1,000 mg by mouth as needed for headache.     ibuprofen (ADVIL) 600 MG tablet Take 1 tablet (600 mg total) by mouth every 6 (six) hours. 30 tablet 0   Prenatal  Vit-Fe Fumarate-FA (MULTIVITAMIN-PRENATAL) 27-0.8 MG TABS tablet Take 2 tablets by mouth at bedtime. (Patient not taking: Reported on 09/13/2022)     No current facility-administered medications for this visit.    OBJECTIVE: Vitals:   09/13/22 1418  BP: 120/74  Pulse: 66  Resp: 16  Temp: 98.3 F (36.8 C)  SpO2: 100%     Body mass index is 40.19 kg/m.    ECOG FS:0 - Asymptomatic  General: Well-developed, well-nourished, no acute distress. Eyes: Pink conjunctiva, anicteric sclera. HEENT: Normocephalic, moist mucous membranes. Lungs: No audible wheezing or coughing. Heart: Regular rate and rhythm. Abdomen: Soft, nontender, no obvious distention. Musculoskeletal: No edema, cyanosis, or clubbing. Neuro: Alert, answering all questions appropriately. Cranial nerves grossly intact. Skin: No rashes or petechiae noted. Psych: Normal affect.  LAB RESULTS:  Lab Results  Component Value Date   NA 133 (L) 05/30/2022   K 3.7 05/30/2022   CL 106 05/30/2022   CO2 21 (L) 05/30/2022   GLUCOSE 93 05/30/2022   BUN <5 (L) 05/30/2022   CREATININE 0.60 05/30/2022   CALCIUM 8.7 (L) 05/30/2022   PROT 6.2 (L) 05/30/2022   ALBUMIN 2.9 (L) 05/30/2022   AST 17 05/30/2022   ALT 12 05/30/2022   ALKPHOS 58 05/30/2022   BILITOT 1.2 05/30/2022   GFRNONAA >60 05/30/2022   GFRAA 104 09/23/2019    Lab Results  Component Value Date   WBC 8.4 09/07/2022   NEUTROABS 10.2 (H) 05/31/2022   HGB 14.0 09/07/2022   HCT 44.8 09/07/2022   MCV 80.0 09/07/2022   PLT 313 09/07/2022   Lab Results  Component Value Date   IRON 87 09/07/2022   TIBC 354 09/07/2022   IRONPCTSAT 25 09/07/2022    Lab Results  Component Value Date   FERRITIN 100 09/07/2022     STUDIES: No results found.  ASSESSMENT: Iron deficiency anemia in pregnancy.  PLAN:  1.  Iron deficiency anemia in pregnancy: Resolved.  Patient cannot tolerate oral iron supplementation.  She last received 500 mg of IV Venofer in the hospital  just after giving birth on June 01, 2022.  No intervention is needed at this time.  Patient does not require additional treatment.  No further follow-up has been scheduled.  Please refer patient back if there are any questions or concerns.   2.  Pregnancy: Patient's son was born on May 31, 2022.    I spent a total of 20 minutes reviewing chart data, face-to-face evaluation with the patient, counseling and coordination of care as detailed above.    Patient expressed understanding and was in agreement with this plan. She also understands that She can call clinic at any time with any questions, concerns, or complaints.    Jeralyn Ruths, MD   09/13/2022 2:32 PM

## 2022-09-21 ENCOUNTER — Encounter: Payer: Self-pay | Admitting: Oncology

## 2022-10-03 NOTE — Progress Notes (Unsigned)
    GYNECOLOGY PROGRESS NOTE  Subjective:   Subjective:     Deborah House is a 32 y.o. female here for discussion regarding weight loss. She has noted a weight gain of approximately *** pounds over the last {1-10:13787} {time; units:10300::"years"}. She feels ideal weight is *** pounds. Weight at graduation from high school was *** pounds. History of eating disorders: {eating disorders:12511::"none"}. There is a family history positive for obesity in the {family:120002}. Previous treatments for obesity include {obesity treatment:12513}. Obesity associated medical conditions: {diagnoses:12512}. Obesity associated medications: {obesity assoc meds:1214::"none"}. Cardiovascular risk factors besides obesity: {risks factors:510}.   Patient is a 32 y.o. [phone] female who presents for evaluation of amenorrhea. She has not had a cycle since she had her baby in January. She had some mild spotting on 03/01, it resolved the next day. She did not get a cycles after her last 2 pregnancies until she loss weight. She tried taking Provera after her first pregnancy in 2019, she had one cycle then and then her cycles stopped coming on again. She was then put on Phentermine and after 2 months she started having a cycle again.  Intermittent fasting.  Walking around her block 2-3 times (~ 15 minutes).   {Common ambulatory SmartLinks:19316}  Review of Systems {ros; complete:30496}    Objective:    Body mass index is 41.27 kg/m. {exam, complete:17964}    Assessment:    Obesity. I assessed Deborah House to be in {stages:16440} stage with respect to weight loss.    Plan:    {AVWU:98119}   Hildred Laser, MD [facility]

## 2022-10-05 ENCOUNTER — Ambulatory Visit (INDEPENDENT_AMBULATORY_CARE_PROVIDER_SITE_OTHER): Payer: BC Managed Care – PPO | Admitting: Obstetrics and Gynecology

## 2022-10-05 ENCOUNTER — Encounter: Payer: Self-pay | Admitting: Obstetrics and Gynecology

## 2022-10-05 VITALS — BP 100/57 | HR 75 | Resp 16 | Ht 66.0 in | Wt 255.7 lb

## 2022-10-05 DIAGNOSIS — Z7689 Persons encountering health services in other specified circumstances: Secondary | ICD-10-CM

## 2022-10-05 DIAGNOSIS — N926 Irregular menstruation, unspecified: Secondary | ICD-10-CM | POA: Diagnosis not present

## 2022-10-05 DIAGNOSIS — Z6841 Body Mass Index (BMI) 40.0 and over, adult: Secondary | ICD-10-CM | POA: Diagnosis not present

## 2022-10-05 MED ORDER — MEDROXYPROGESTERONE ACETATE 10 MG PO TABS: 10.0000 mg | ORAL_TABLET | Freq: Every day | ORAL | 2 refills | Status: DC

## 2022-10-05 MED ORDER — PHENTERMINE HCL 37.5 MG PO CAPS: 37.5000 mg | ORAL_CAPSULE | ORAL | 2 refills | Status: DC

## 2022-10-08 ENCOUNTER — Telehealth: Payer: Self-pay

## 2022-10-08 NOTE — Telephone Encounter (Signed)
I just now sent PA to [facility] and waiting on response.

## 2022-10-08 NOTE — Telephone Encounter (Signed)
When I pull this Key code in cover my meds it appears that pre-authorization was sent on 10/05/22 under status it reads question request, I try opening it and it redirects me back to the dashboard. Please advise. KW

## 2022-10-12 NOTE — Telephone Encounter (Signed)
Patient called. No answer. Sent patient a Clinical cytogeneticist message regarding approval .

## 2022-10-15 DIAGNOSIS — H1032 Unspecified acute conjunctivitis, left eye: Secondary | ICD-10-CM | POA: Diagnosis not present

## 2022-11-06 NOTE — Progress Notes (Unsigned)
    GYNECOLOGY PROGRESS NOTE  Subjective:    Patient ID: Deborah House, female    DOB: 04-05-1991, 32 y.o.   MRN: [id number]  HPI  Patient is a 32 y.o. female who presents for 1 month weight management follow up. She has a past history of obesity. She initiated use of Phentermine 1 months ago.  Denies any undesirable side effects and reports compliance with medications.    Current interventions:  1. Diet -  2. Activity -  3. Reports bowel movements are ***.    {Common ambulatory SmartLinks:19316}  Review of Systems {ros; complete:30496}   Objective:       10/05/2022    9:45 AM 09/13/2022    2:18 PM 07/12/2022   11:22 AM  Vitals with BMI  Height 5\' 6"  5\' 6"    Weight 255 lbs 11 oz 249 lbs 237 lbs 8 oz  BMI 41.29 40.21 38.35  Systolic 100 120 782  Diastolic 57 74 88  Pulse 75 66 65    General appearance: {general exam:16600} Abdomen: soft, non-tender.  Waist circumference *** in.    Labs:   Assessment:   Weight management Obesity, There is no height or weight on file to calculate BMI.  Plan:   Weight management  - doing well with weight loss, can continue current management.      Hildred Laser, MD [facility]

## 2022-11-06 NOTE — Patient Instructions (Signed)
Phentermine Capsules or Tablets What is this medication? PHENTERMINE (FEN ter meen) promotes weight loss. It works by decreasing appetite. It is often used for a short period of time. Changes to diet and exercise are often combined with this medication. This medicine may be used for other purposes; ask your health care provider or pharmacist if you have questions. COMMON BRAND NAME(S): Adipex-P, Atti-Plex P, Atti-Plex P Spansule, Fastin, Lomaira, Pro-Fast, Pro-Fast HS, Pro-Fast SA, Tara-8 What should I tell my care team before I take this medication? They need to know if you have any of these conditions: Agitation or nervousness Diabetes Glaucoma Heart disease High blood pressure History of substance use disorder History of stroke Kidney disease Lung disease called Primary Pulmonary Hypertension (PPH) Taken an MAOI, such as Carbex, Eldepryl, Marplan, Nardil, or Parnate in last 14 days Taking stimulant medications for attention disorders, weight loss, or to stay awake Thyroid disease An unusual or allergic reaction to phentermine, other medications, foods, dyes, or preservatives Pregnant or trying to get pregnant Breastfeeding How should I use this medication? Take this medication by mouth with a glass of water. Follow the directions on the prescription label. Take your medication at regular intervals. Do not take it more often than directed. Do not stop taking except on your care team's advice. Talk to your care team about the use of this medication in children. While this medication may be prescribed for children 17 years or older for selected conditions, precautions do apply. Overdosage: If you think you have taken too much of this medicine contact a poison control center or emergency room at once. NOTE: This medicine is only for you. Do not share this medicine with others. What if I miss a dose? If you miss a dose, take it as soon as you can. If it is almost time for your next dose,  take only that dose. Do not take double or extra doses. What may interact with this medication? Do not take this medication with any of the following: MAOIs, such as Carbex, Eldepryl, Marplan, Nardil, and Parnate This medication may also interact with the following: Alcohol Certain medications for depression, anxiety, or other mental health conditions Certain medications for blood pressure Linezolid Medications for colds or breathing difficulties, such as pseudoephedrine or phenylephrine Medications for diabetes Sibutramine Stimulant medications for ADHD, weight loss, or staying awake This list may not describe all possible interactions. Give your health care provider a list of all the medicines, herbs, non-prescription drugs, or dietary supplements you use. Also tell them if you smoke, drink alcohol, or use illegal drugs. Some items may interact with your medicine. What should I watch for while using this medication? Visit your care team for regular checks on your progress. Do not stop taking except on your care team's advice. You may develop a severe reaction. Your care team will tell you how much medication to take. Do not take this medication close to bedtime. It may prevent you from sleeping. This medication may affect your coordination, reaction time, or judgment. Do not drive or operate machinery until you know how this medication affects you. Sit up or stand slowly to reduce the risk of dizzy or fainting spells. Drinking alcohol with this medication can increase the risk of these side effects. This medication may affect blood sugar levels. Ask your care team if changes in diet or medications are needed if you have diabetes. Inform your care team if you wish to become pregnant or think you might be pregnant. Losing   weight while pregnant is not advised and may cause harm to the unborn child. Talk to your care team for more information. What side effects may I notice from receiving this  medication? Side effects that you should report to your care team as soon as possible: Allergic reactions--skin rash, itching, hives, swelling of the face, lips, tongue, or throat Heart valve disease--shortness of breath, chest pain, unusual weakness or fatigue, dizziness, feeling faint or lightheaded, fever, sudden weight gain, fast or irregular heartbeat Pulmonary hypertension--shortness of breath, chest pain, fast or irregular heartbeat, feeling faint or lightheaded, fatigue, swelling of the ankles or feet Side effects that usually do not require medical attention (report to your care team if they continue or are bothersome): Change in taste Diarrhea Dizziness Dry mouth Restlessness Trouble sleeping This list may not describe all possible side effects. Call your doctor for medical advice about side effects. You may report side effects to FDA at 1-800-FDA-1088. Where should I keep my medication? Keep out of the reach of children. This medication can be abused. Keep your medication in a safe place to protect it from theft. Do not share this medication with anyone. Selling or giving away this medication is dangerous and against the law. This medication may cause harm and death if it is taken by other adults, children, or pets. Return medication that has not been used to an official disposal site. Contact the DEA at 856 487 6791 or your city/county government to find a site. If you cannot return the medication, mix any unused medication with a substance like cat litter or coffee grounds. Then throw the medication away in a sealed container like a sealed bag or coffee can with a lid. Do not use the medication after the expiration date. Store at room temperature between 20 and 25 degrees C (68 and 77 degrees F). Keep container tightly closed. NOTE: This sheet is a summary. It may not cover all possible information. If you have questions about this medicine, talk to your doctor, pharmacist, or health  care provider.  2024 Elsevier/Gold Standard (2021-11-15 00:00:00)

## 2022-11-07 ENCOUNTER — Ambulatory Visit (INDEPENDENT_AMBULATORY_CARE_PROVIDER_SITE_OTHER): Payer: BC Managed Care – PPO | Admitting: Obstetrics and Gynecology

## 2022-11-07 ENCOUNTER — Encounter: Payer: Self-pay | Admitting: Obstetrics and Gynecology

## 2022-11-07 VITALS — BP 113/69 | HR 64 | Resp 16 | Ht 66.0 in | Wt 245.6 lb

## 2022-11-07 DIAGNOSIS — Z7689 Persons encountering health services in other specified circumstances: Secondary | ICD-10-CM | POA: Diagnosis not present

## 2022-11-07 DIAGNOSIS — E669 Obesity, unspecified: Secondary | ICD-10-CM | POA: Diagnosis not present

## 2022-11-07 DIAGNOSIS — Z6839 Body mass index (BMI) 39.0-39.9, adult: Secondary | ICD-10-CM | POA: Diagnosis not present

## 2022-11-07 DIAGNOSIS — R5383 Other fatigue: Secondary | ICD-10-CM | POA: Diagnosis not present

## 2022-11-07 DIAGNOSIS — Z713 Dietary counseling and surveillance: Secondary | ICD-10-CM | POA: Diagnosis not present

## 2022-11-07 DIAGNOSIS — R7303 Prediabetes: Secondary | ICD-10-CM | POA: Diagnosis not present

## 2022-11-07 MED ORDER — TOPIRAMATE 25 MG PO TABS: 25.0000 mg | ORAL_TABLET | Freq: Two times a day (BID) | ORAL | 3 refills | Status: DC

## 2022-11-07 MED ORDER — CYANOCOBALAMIN 1000 MCG/ML IJ SOLN
1000.0000 ug | Freq: Once | INTRAMUSCULAR | Status: AC
  Administered 2022-11-07: 1000 ug via INTRAMUSCULAR

## 2022-11-08 ENCOUNTER — Encounter: Payer: Self-pay | Admitting: Obstetrics and Gynecology

## 2022-11-08 ENCOUNTER — Other Ambulatory Visit: Payer: Self-pay | Admitting: Obstetrics and Gynecology

## 2022-11-08 DIAGNOSIS — E559 Vitamin D deficiency, unspecified: Secondary | ICD-10-CM | POA: Insufficient documentation

## 2022-11-08 DIAGNOSIS — E669 Obesity, unspecified: Secondary | ICD-10-CM | POA: Insufficient documentation

## 2022-11-08 DIAGNOSIS — R7303 Prediabetes: Secondary | ICD-10-CM | POA: Insufficient documentation

## 2022-11-08 LAB — LIPID PANEL
Chol/HDL Ratio: 2.9 ratio (ref 0.0–4.4)
Cholesterol, Total: 158 mg/dL (ref 100–199)
HDL: 55 mg/dL (ref >39)
LDL Chol Calc (NIH): 90 mg/dL (ref 0–99)
Triglycerides: 63 mg/dL (ref 0–149)
VLDL Cholesterol Cal: 13 mg/dL (ref 5–40)

## 2022-11-08 LAB — VITAMIN B12: Vitamin B-12: >2000 pg/mL — ABNORMAL HIGH (ref 232–1245)

## 2022-11-08 LAB — HEMOGLOBIN A1C
Est. average glucose Bld gHb Est-mCnc: 126 mg/dL
Hgb A1c MFr Bld: 6 % — ABNORMAL HIGH (ref 4.8–5.6)

## 2022-11-08 LAB — VITAMIN D 25 HYDROXY (VIT D DEFICIENCY, FRACTURES): Vit D, 25-Hydroxy: 9 ng/mL — ABNORMAL LOW (ref 30.0–100.0)

## 2022-11-08 MED ORDER — VITAMIN D (ERGOCALCIFEROL) 1.25 MG (50000 UNIT) PO CAPS: 50000.0000 [IU] | ORAL_CAPSULE | ORAL | 0 refills | Status: DC

## 2022-12-10 NOTE — Progress Notes (Unsigned)
    GYNECOLOGY PROGRESS NOTE  Subjective:    Patient ID: Amana Bouska, female    DOB: 16-May-1991, 32 y.o.   MRN: [id number]  HPI  Patient is a 32 y.o. female who presents for {NUMBER 1-10:22536} month weight management follow up. She has a past history of obesity, ***. She initiated use of *** {NUMBER 1-10:22536} months ago.  Denies any undesirable side effects and reports compliance with medications.    Current interventions:  1. Diet -  2. Activity -  3. Reports bowel movements are ***.    {Common ambulatory SmartLinks:19316}  Review of Systems {ros; complete:30496}   Objective:       11/07/2022    9:56 AM 10/05/2022    9:45 AM 09/13/2022    2:18 PM  Vitals with BMI  Height 5\' 6"  5\' 6"  5\' 6"   Weight 245 lbs 10 oz 255 lbs 11 oz 249 lbs  BMI 39.66 41.29 40.21  Systolic 113 100 981  Diastolic 69 57 74  Pulse 64 75 66    General appearance: {general exam:16600} Abdomen: soft, non-tender.  Waist circumference *** in.    Labs:   Assessment:   Weight management Obesity, There is no height or weight on file to calculate BMI.  Plan:   Weight management  - doing well with weight loss, can continue current management.      [name], CMA [facility]

## 2022-12-11 ENCOUNTER — Ambulatory Visit (INDEPENDENT_AMBULATORY_CARE_PROVIDER_SITE_OTHER): Payer: BC Managed Care – PPO | Admitting: Obstetrics and Gynecology

## 2022-12-11 ENCOUNTER — Encounter: Payer: Self-pay | Admitting: Obstetrics and Gynecology

## 2022-12-11 VITALS — BP 98/66 | HR 84 | Resp 16 | Ht 66.0 in | Wt 241.3 lb

## 2022-12-11 DIAGNOSIS — Z6835 Body mass index (BMI) 35.0-35.9, adult: Secondary | ICD-10-CM

## 2022-12-11 DIAGNOSIS — E559 Vitamin D deficiency, unspecified: Secondary | ICD-10-CM | POA: Diagnosis not present

## 2022-12-11 DIAGNOSIS — M546 Pain in thoracic spine: Secondary | ICD-10-CM

## 2022-12-11 DIAGNOSIS — Z713 Dietary counseling and surveillance: Secondary | ICD-10-CM | POA: Diagnosis not present

## 2022-12-11 DIAGNOSIS — E669 Obesity, unspecified: Secondary | ICD-10-CM | POA: Diagnosis not present

## 2022-12-11 DIAGNOSIS — N926 Irregular menstruation, unspecified: Secondary | ICD-10-CM | POA: Diagnosis not present

## 2022-12-11 DIAGNOSIS — R7303 Prediabetes: Secondary | ICD-10-CM

## 2022-12-11 DIAGNOSIS — Z7689 Persons encountering health services in other specified circumstances: Secondary | ICD-10-CM

## 2022-12-11 MED ORDER — METFORMIN HCL 500 MG PO TABS: 500.0000 mg | ORAL_TABLET | Freq: Two times a day (BID) | ORAL | 6 refills | Status: DC

## 2022-12-12 DIAGNOSIS — R519 Headache, unspecified: Secondary | ICD-10-CM | POA: Diagnosis not present

## 2022-12-12 DIAGNOSIS — R110 Nausea: Secondary | ICD-10-CM | POA: Diagnosis not present

## 2022-12-21 ENCOUNTER — Encounter: Payer: Self-pay | Admitting: Obstetrics and Gynecology

## 2023-01-09 NOTE — Progress Notes (Signed)
    GYNECOLOGY PROGRESS NOTE  Subjective:    Patient ID: Deborah House, female    DOB: May 24, 1990, 32 y.o.   MRN: [id number]  HPI  Patient is a 32 y.o. female who presents for monthly weight management follow up. She has a past history of obesity. She initiated use of Phentermine 3 months ago.  Has used Phentermine in the remote past with good results. Denies any undesirable side effects and reports compliance with medications. Starting weight in June 2024 was 245 lbs (BMI 41.29).     Current interventions:  1. Diet - Does not eat a lot 2. Activity - No change 3. Reports bowel movements are normal.   Also following up after initiation of Metformin last visit for newly diagnosed pre-diabetes.  States that she feels the Metformin is helping to regulate her bowels better.   Lastly, Deborah House complains about a rash/acne on her body that appears to be spreading.  Initially was just on her arms, but now sees it on her abdomen and legs. Denies any recent exposures or contacts.   The following portions of the patient's history were reviewed and updated as appropriate: allergies, current medications, past family history, past medical history, past social history, past surgical history, and problem list.  Review of Systems Pertinent items are noted in HPI.   Objective:       01/11/2023   10:44 AM 12/11/2022   10:20 AM 11/07/2022    9:56 AM  Vitals with BMI  Height 5\' 6"  5\' 6"  5\' 6"   Weight 238 lbs 6 oz 241 lbs 5 oz 245 lbs 10 oz  BMI 38.5 38.97 39.66  Systolic 119 98 113  Diastolic 101 66 69  Pulse 106 84 64    General appearance: alert, cooperative, and no distress Abdomen: soft, non-tender.  Waist circumference 48 in.  Skin: maculopapular rash without    Labs:  Lab Results  Component Value Date   HGBA1C 6.0 (H) 11/07/2022    Assessment:   Weight management Obesity, Body mass index is 38.48 kg/m. Prediabetes Rash and nonspecific skin eruption  Plan:   Weight  management  - patient appears to be quickly reaching a plateau this time on the Phentermine.  Would recommend hiatus at this time for 2-3 months.  Prediabetes - can continue with use of the Metformin for management of her prediabetes and for weight management. Will recheck levels at the end of 3 months of use.  Rash and nonspecific skin eruption - Advised on use of OTC hydrocortisone cream BID x 1 week for skin rash.    Hildred Laser, MD  [facility]

## 2023-01-11 ENCOUNTER — Encounter: Payer: Self-pay | Admitting: Obstetrics and Gynecology

## 2023-01-11 ENCOUNTER — Ambulatory Visit (INDEPENDENT_AMBULATORY_CARE_PROVIDER_SITE_OTHER): Payer: BC Managed Care – PPO | Admitting: Obstetrics and Gynecology

## 2023-01-11 VITALS — BP 119/101 | HR 106 | Ht 66.0 in | Wt 238.4 lb

## 2023-01-11 DIAGNOSIS — R7303 Prediabetes: Secondary | ICD-10-CM | POA: Diagnosis not present

## 2023-01-11 DIAGNOSIS — Z7689 Persons encountering health services in other specified circumstances: Secondary | ICD-10-CM

## 2023-01-11 DIAGNOSIS — Z713 Dietary counseling and surveillance: Secondary | ICD-10-CM

## 2023-01-11 DIAGNOSIS — E669 Obesity, unspecified: Secondary | ICD-10-CM

## 2023-01-11 DIAGNOSIS — Z6841 Body Mass Index (BMI) 40.0 and over, adult: Secondary | ICD-10-CM

## 2023-01-11 DIAGNOSIS — R21 Rash and other nonspecific skin eruption: Secondary | ICD-10-CM | POA: Diagnosis not present

## 2024-05-07 IMAGING — US US OB < 14 WEEKS - US OB TV
1 series · 14 of 28 positions shown · non-contrast
Comparison: None Available.

CLINICAL DATA: Pregnant, pelvic pain

EXAM:
OBSTETRIC <14 WK US AND TRANSVAGINAL OB US
TECHNIQUE: Both transabdominal and transvaginal ultrasound examinations were
performed for complete evaluation of the gestation as well as the
maternal uterus, adnexal regions, and pelvic cul-de-sac.
Transvaginal technique was performed to assess early pregnancy.

[Series 1: us ob less than 14 weeks with ob transvaginal · 14 of 86 slices shown]
[im 4/86]
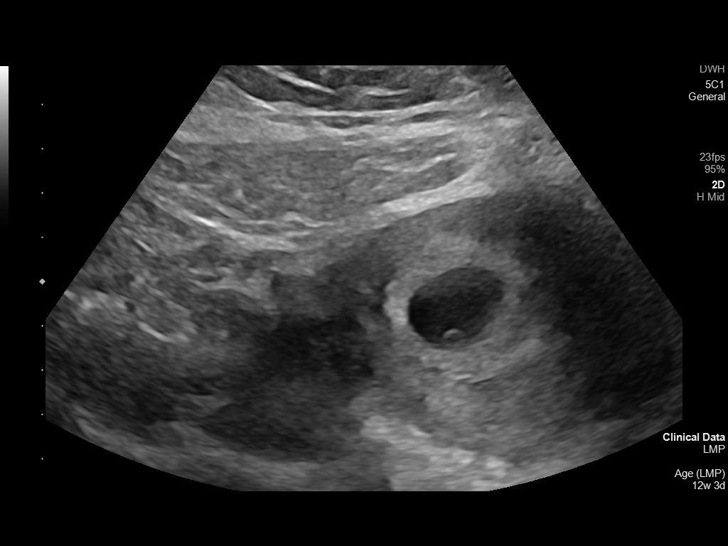
[im 10/86]
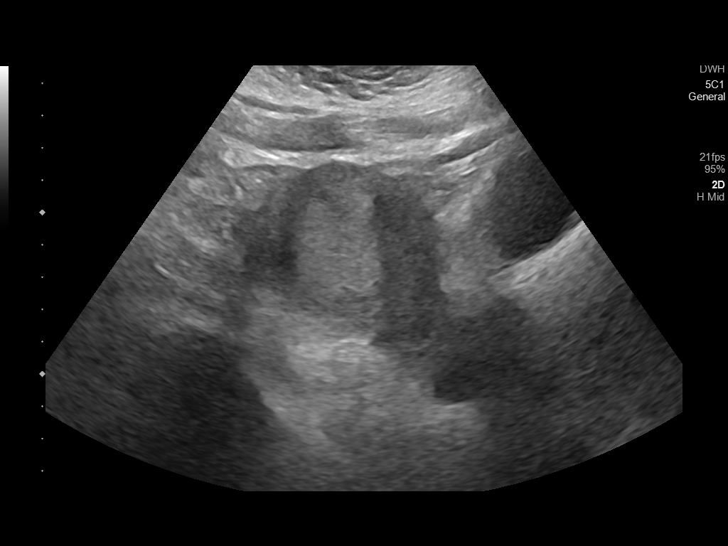
[im 16/86]
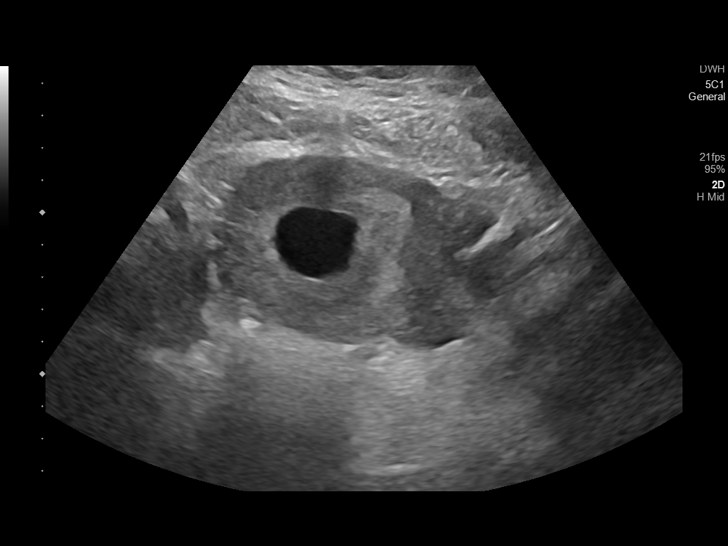
[im 23/86]
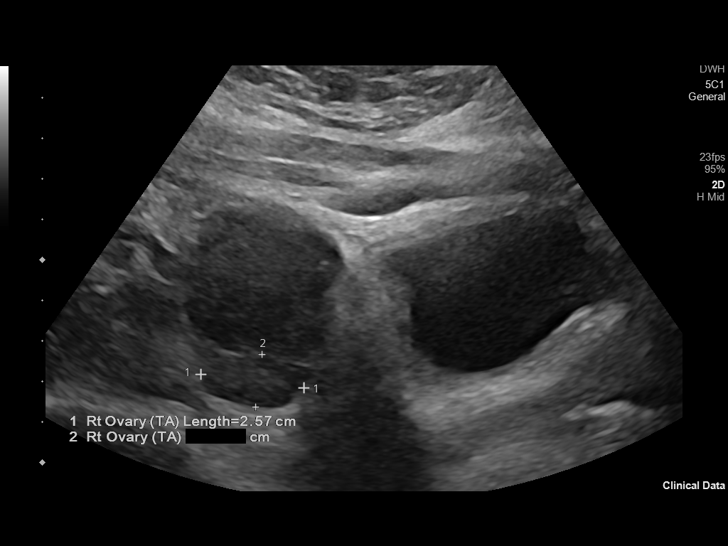
[im 29/86]
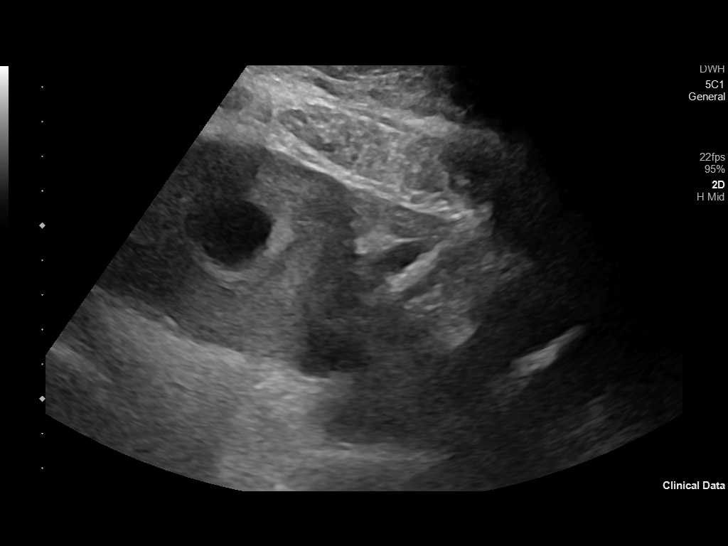
[im 35/86]
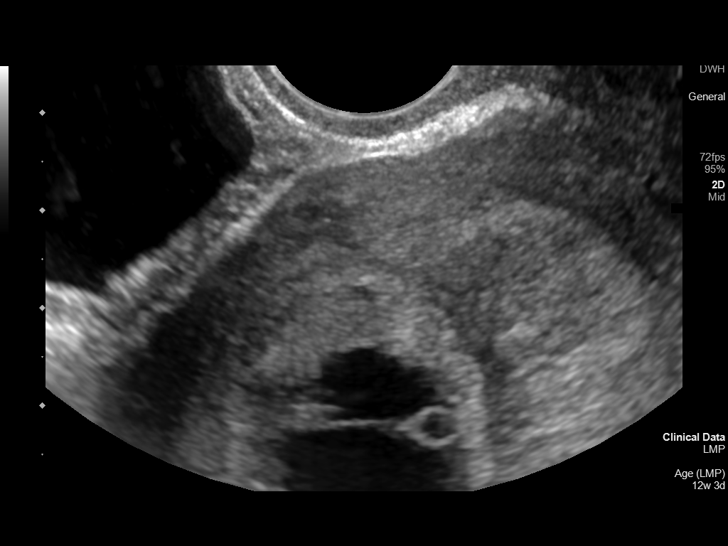
[im 41/86]
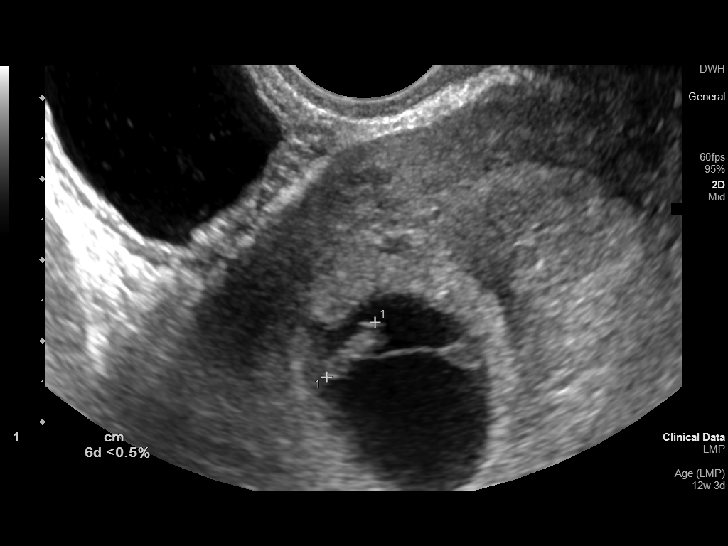
[im 48/86]
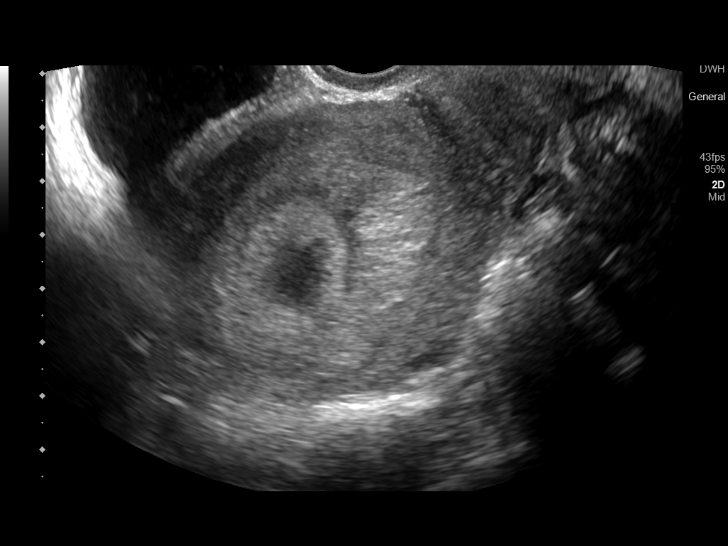
[im 54/86]
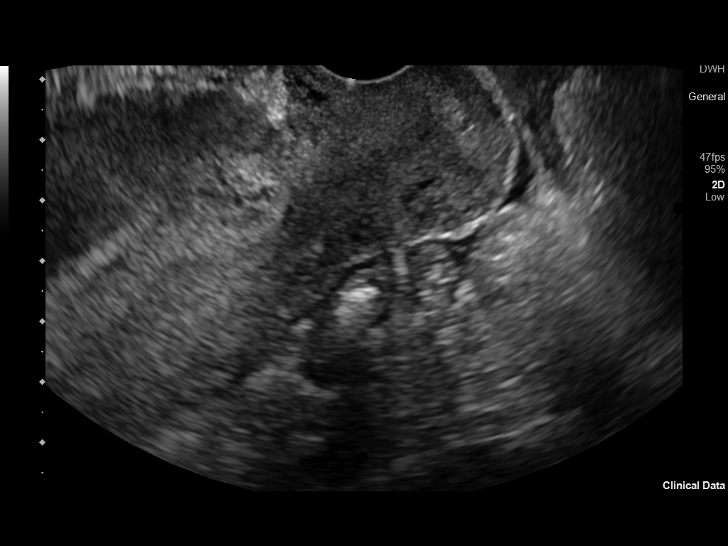
[im 60/86]
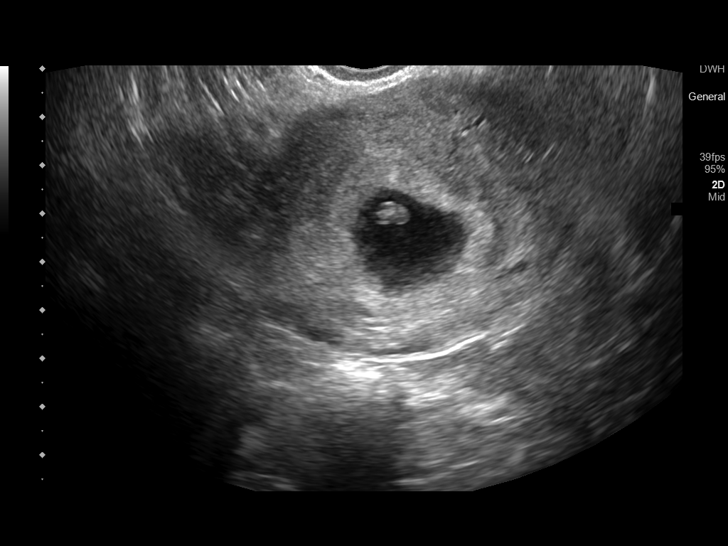
[im 67/86]
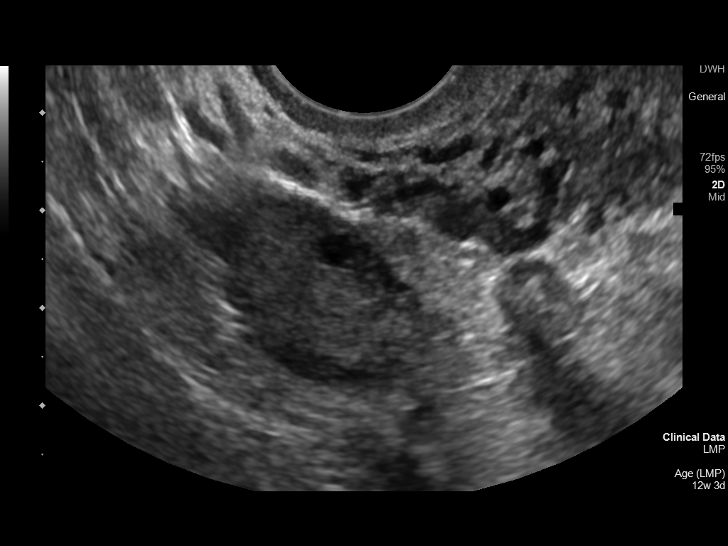
[im 73/86]
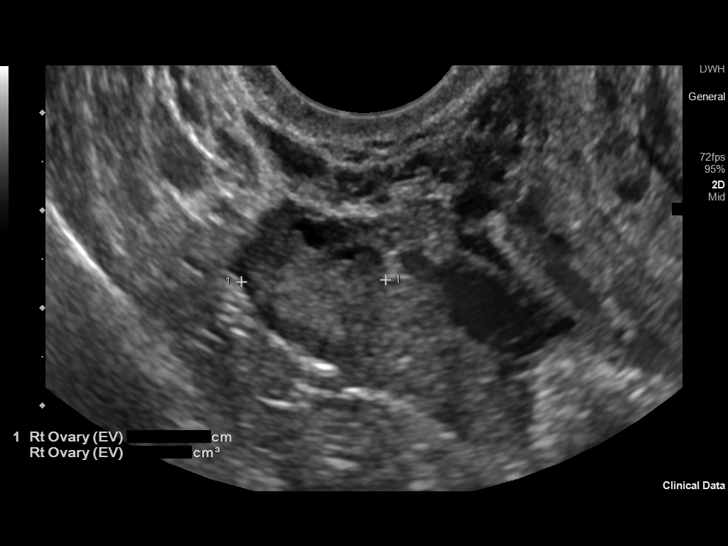
[im 79/86]
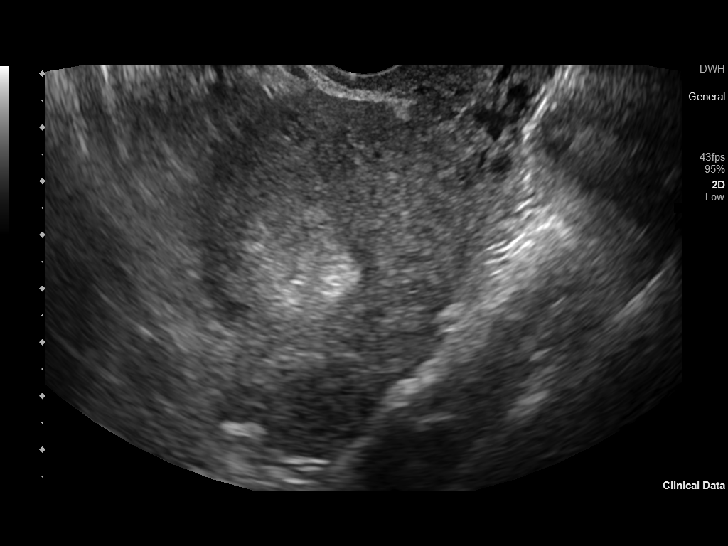
[im 86/86]
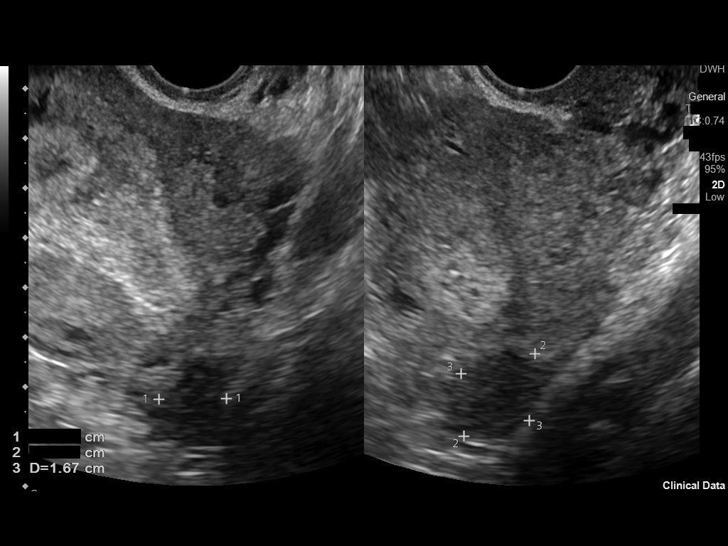

[14 of 28 positions shown; findings below may reference images not displayed]

FINDINGS: Intrauterine gestational sac: Single

Yolk sac:  Visualized.

Embryo:  Visualized.

Cardiac Activity: Visualized.

Heart Rate: 119 bpm

CRL:  9.1 mm   6 w   6 d                  US EDC: 06/01/2022

Subchorionic hemorrhage:  None visualized.

Maternal uterus/adnexae: Bilateral ovaries are within normal limits,
noting a left corpus luteum.

Trace pelvic fluid, physiologic.
IMPRESSION: Single intrauterine gestation with cardiac activity, measuring 6
weeks 6 days by crown-rump length, as above.

## 2024-05-22 NOTE — Progress Notes (Signed)
 "  ANNUAL EXAM Patient name: Deborah House MRN [id number]  Date of birth: Aug 31, 1990 Chief Complaint:   Annual Exam  History of Present Illness:   Deborah House is a 34 y.o. [phone] African-American female being seen today for a routine annual exam.  Current complaints: Patient would like to address changes in her hormones. Patient reports concerns of hair loss, mood changes-irritability, brain fog, body acne and pain during ovulation. Reports she was told she had PCOS in the past but not formally diagnosed. Has a family hx of breast cancer, last mammogram was 2022.  Patient's last menstrual period was 05/16/2024 (exact date).   Period Cycle (Days): 28 Period Duration (Days): 5 Period Pattern: Regular Menstrual Flow: Light, Moderate, Heavy Menstrual Control: Maxi pad Menstrual Control Change Freq (Hours): 1-4 Dysmenorrhea: (!) Moderate Dysmenorrhea Symptoms: Cramping   Upstream - 05/25/24 0828       Pregnancy Intention Screening   Does the patient want to become pregnant in the next year? No    Does the patient's partner want to become pregnant in the next year? No    Would the patient like to discuss contraceptive options today? No      Contraception Wrap Up   Current Method Vasectomy    End Method Vasectomy    Contraception Counseling Provided No         The pregnancy intention screening data noted above was reviewed. Potential methods of contraception were discussed. The patient elected to proceed with Vasectomy.      Component Value Date/Time   DIAGPAP  10/10/2021 1513    - Negative for intraepithelial lesion or malignancy (NILM)   DIAGPAP  08/05/2020 1015    - Negative for intraepithelial lesion or malignancy (NILM)   DIAGPAP  08/19/2019 1340    - Negative for intraepithelial lesion or malignancy (NILM)   HPVHIGH Negative 08/05/2020 1015   HPVHIGH Negative 08/19/2019 1340   ADEQPAP  10/10/2021 1513    Satisfactory for evaluation; transformation  zone component PRESENT.   ADEQPAP  08/05/2020 1015    Satisfactory for evaluation; transformation zone component ABSENT.   ADEQPAP  08/19/2019 1340    Satisfactory for evaluation; transformation zone component ABSENT.      Last pap 10/10/2021. Results were: negative. H/O abnormal pap: no Last mammogram: N/A. Results were: N/A. Family h/o breast cancer: yes paternal Last colonoscopy: N/A. Results were: N/A. Family h/o colorectal cancer: no     05/25/2024    8:54 AM 10/04/2017    3:01 PM  Depression screen PHQ 2/9  Decreased Interest 0 0  Down, Depressed, Hopeless 0 0  PHQ - 2 Score 0 0  Altered sleeping 0 2  Tired, decreased energy 3 2  Change in appetite 1 0  Feeling bad or failure about yourself  0 0  Trouble concentrating 3 0  Moving slowly or fidgety/restless 0 0  Suicidal thoughts 0 1  PHQ-9 Score 7 5   Difficult doing work/chores Not difficult at all Not difficult at all     Data saved with a previous flowsheet row definition        05/25/2024    8:56 AM 12/09/2018    2:59 PM  GAD 7 : Generalized Anxiety Score  Nervous, Anxious, on Edge 1 1  Control/stop worrying 0 1  Worry too much - different things 0 1  Trouble relaxing 3 0  Restless 0 0  Easily annoyed or irritable 3 0  Afraid - awful might happen 0 2  Total GAD 7 Score 7 5  Anxiety Difficulty Not difficult at all Not difficult at all      Past Medical History:  Diagnosis Date   Abdominal pain    Amenorrhea    Anemia    Anemia affecting pregnancy 05/15/2017   Hgb 8.0 at 28 weeks [X]  hematology referral   Chlamydia 2012   tx'd   Family history of ovarian cancer 07/2015   genetic testing letter sent; 4/22 PALB2 positive   Genetic testing of female 08/2020   MyRisk PALB2 positive, with 2 MSH3, POLE, and RNF43 VUS   Hemorrhoids    History of 2 cesarean sections 07/09/2019   Low-lying placenta in third trimester    Missed abortion 06/01/2014   RPH   Monoallelic mutation of PALB2 gene 08/2020    MyRisk; increased risk of breast/ovar/pancreatic cancer   Obstetrical laceration 06/01/2022   Bilateral Sulcus     Shoulder dystocia during labor and delivery 05/31/2022   G4: 50s shoulder   VBAC (vaginal birth after Cesarean) 03/20/2022    Family History  Problem Relation Age of Onset   Anemia Mother    Anemia Sister    Cancer Maternal Grandmother    Diabetes Maternal Grandmother    Ovarian cancer Maternal Grandmother 60   Asthma Neg Hx    Heart disease Neg Hx    Hypertension Neg Hx    Stroke Neg Hx    Review of Systems:   Pertinent items are noted in HPI Denies any headaches, blurred vision, fatigue, shortness of breath, chest pain, abdominal pain, abnormal vaginal discharge/itching/odor/irritation, problems with periods, bowel movements, urination, or intercourse unless otherwise stated above. Pertinent History Reviewed:  Reviewed past medical,surgical, social and family history.  Reviewed problem list, medications and allergies. Physical Assessment:   Vitals:   05/25/24 0829  BP: 132/79  Pulse: 65  Weight: 278 lb 9.6 oz (126.4 kg)  Height: 5' 6 (1.676 m)  Body mass index is 44.97 kg/m.       Physical Exam Vitals reviewed.  Constitutional:      General: She is not in acute distress.    Appearance: Normal appearance. She is obese.  HENT:     Head: Normocephalic.  Neck:     Thyroid : No thyroid  mass or thyromegaly.  Cardiovascular:     Rate and Rhythm: Normal rate and regular rhythm.     Heart sounds: Normal heart sounds.  Pulmonary:     Effort: Pulmonary effort is normal.     Breath sounds: Normal breath sounds.  Chest:  Breasts:    Tanner Score is 5.     Right: Normal.     Left: Nipple discharge present.     Comments: Clear to white nipple discharge, scant present with expression. Abdominal:     General: Abdomen is flat.     Palpations: Abdomen is soft.     Tenderness: There is no abdominal tenderness.  Genitourinary:    General: Normal vulva.      Tanner stage (genital): 5.     Vagina: Normal.     Cervix: Friability present. No cervical motion tenderness.     Uterus: Not enlarged and not tender.      Adnexa:        Right: No mass or tenderness.         Left: No mass or tenderness.    Musculoskeletal:     Cervical back: Neck supple. No tenderness.  Lymphadenopathy:     Upper Body:  Right upper body: No axillary adenopathy.     Left upper body: No axillary adenopathy.  Skin:    General: Skin is warm and dry.  Neurological:     General: No focal deficit present.     Mental Status: She is alert and oriented to person, place, and time.  Psychiatric:        Mood and Affect: Mood normal.        Behavior: Behavior normal.      No results found for this or any previous visit (from the past 24 hours).  Assessment & Plan:  1. Well woman exam (Primary) - CBC - Hemoglobin A1c - Lipid panel - Comprehensive metabolic panel with GFR - TSH Rfx on Abnormal to Free T4 - HIV Antibody (routine testing w rflx) - RPR W/RFLX TO RPR TITER, TREPONEMAL AB, SCREEN AND DIAGNOSIS - VITAMIN D  25 Hydroxy (Vit-D Deficiency, Fractures) - MM DIGITAL SCREENING BILATERAL; Future - Cytology - PAP  2. Encounter for screening for human papillomavirus (HPV) - Cytology - PAP  3. Cervical cancer screening - Cytology - PAP  4. Screen for sexually transmitted diseases - RPR W/RFLX TO RPR TITER, TREPONEMAL AB, SCREEN AND DIAGNOSIS - Cytology - PAP  5. Screening for human immunodeficiency virus - HIV Antibody (routine testing w rflx)  6. Vitamin D  deficiency  7. Prediabetes - Hemoglobin A1c - VITAMIN D  25 Hydroxy (Vit-D Deficiency, Fractures)  8. Galactorrhea - Prolactin  9. Other fatigue  Await results of A1c, discussed restarting metformin , referral to nutrition services, recommend establishing with PCP to also discuss possibility of GLP1 medications if remains pre-diabetic on labs in setting of obesity. Mammogram: schedule screening  mammo as soon as possible, or sooner if problems Colonoscopy: @ 36yo, or sooner if problems  Orders Placed This Encounter  Procedures   MM DIGITAL SCREENING BILATERAL   CBC   Hemoglobin A1c   Lipid panel   Comprehensive metabolic panel with GFR   TSH Rfx on Abnormal to Free T4   HIV Antibody (routine testing w rflx)   RPR W/RFLX TO RPR TITER, TREPONEMAL AB, SCREEN AND DIAGNOSIS   VITAMIN D  25 Hydroxy (Vit-D Deficiency, Fractures)   Prolactin    Meds: No orders of the defined types were placed in this encounter.   Follow-up: Return in about 1 year (around 05/25/2025) for Annual exam.  Harlene LITTIE Cisco, CNM 05/25/2024 9:03 AM  "

## 2024-05-22 NOTE — Patient Instructions (Signed)
 Preventive Care 28-36 Years Old, Female  Preventive care refers to lifestyle choices and visits with your health care provider that can promote health and wellness. Preventive care visits are also called wellness exams. What can I expect for my preventive care visit? Counseling During your preventive care visit, your health care provider may ask about your: Medical history, including: Past medical problems. Family medical history. Pregnancy history. Current health, including: Menstrual cycle. Method of birth control. Emotional well-being. Home life and relationship well-being. Sexual activity and sexual health. Lifestyle, including: Alcohol, nicotine or tobacco, and drug use. Access to firearms. Diet, exercise, and sleep habits. Work and work Astronomer. Sunscreen use. Safety issues such as seatbelt and bike helmet use. Physical exam Your health care provider may check your: Height and weight. These may be used to calculate your BMI (body mass index). BMI is a measurement that tells if you are at a healthy weight. Waist circumference. This measures the distance around your waistline. This measurement also tells if you are at a healthy weight and may help predict your risk of certain diseases, such as type 2 diabetes and high blood pressure. Heart rate and blood pressure. Body temperature. Skin for abnormal spots. What immunizations do I need?  Vaccines are usually given at various ages, according to a schedule. Your health care provider will recommend vaccines for you based on your age, medical history, and lifestyle or other factors, such as travel or where you work. What tests do I need? Screening Your health care provider may recommend screening tests for certain conditions. This may include: Pelvic exam and Pap test. Lipid and cholesterol levels. Diabetes screening. This is done by checking your blood sugar (glucose) after you have not eaten for a while  (fasting). Hepatitis B test. Hepatitis C test. HIV (human immunodeficiency virus) test. STI (sexually transmitted infection) testing, if you are at risk. BRCA-related cancer screening. This may be done if you have a family history of breast, ovarian, tubal, or peritoneal cancers. Talk with your health care provider about your test results, treatment options, and if necessary, the need for more tests. Follow these instructions at home: Eating and drinking  Eat a healthy diet that includes fresh fruits and vegetables, whole grains, lean protein, and low-fat dairy products. Take vitamin and mineral supplements as recommended by your health care provider. Do not drink alcohol if: Your health care provider tells you not to drink. You are pregnant, may be pregnant, or are planning to become pregnant. If you drink alcohol: Limit how much you have to 0-1 drink a day. Know how much alcohol is in your drink. In the [place] one drink equals one 12 oz bottle of beer (355 mL), one 5 oz glass of wine (148 mL), or one 1 oz glass of hard liquor (44 mL). Lifestyle Brush your teeth every morning and night with fluoride toothpaste. Floss one time each day. Exercise for at least 30 minutes 5 or more days each week. Do not use any products that contain nicotine or tobacco. These products include cigarettes, chewing tobacco, and vaping devices, such as e-cigarettes. If you need help quitting, ask your health care provider. Do not use drugs. If you are sexually active, practice safe sex. Use a condom or other form of protection to prevent STIs. If you do not wish to become pregnant, use a form of birth control. If you plan to become pregnant, see your health care provider for a prepregnancy visit. Find healthy ways to manage stress, such as:  Meditation, yoga, or listening to music. Journaling. Talking to a trusted person. Spending time with friends and family. Minimize exposure to UV radiation to reduce your  risk of skin cancer. Safety Always wear your seat belt while driving or riding in a vehicle. Do not drive: If you have been drinking alcohol. Do not ride with someone who has been drinking. If you have been using any mind-altering substances or drugs. While texting. When you are tired or distracted. Wear a helmet and other protective equipment during sports activities. If you have firearms in your house, make sure you follow all gun safety procedures. Seek help if you have been physically or sexually abused. What's next? Go to your health care provider once a year for an annual wellness visit. Ask your health care provider how often you should have your eyes and teeth checked. Stay up to date on all vaccines. This information is not intended to replace advice given to you by your health care provider. Make sure you discuss any questions you have with your health care provider. Document Revised: 11/02/2020 Document Reviewed: 11/02/2020 Elsevier Patient Education  2024 [employer]     How to Do a Breast Self-Exam Doing breast self-exams can help you stay healthy. They're one way to know what's normal for your breasts. They can help you catch a problem while it's still small and can be treated. You need to: Check your breasts often. Tell your doctor about any changes. You should do breast self-exams even if you have breast implants. What you need: A mirror. A well-lit room. A pillow or other soft object. How to do a breast self-exam Look for changes  Take off all the clothes above your waist. Stand in front of a mirror in a room with good lighting. Put your hands down at your sides. Compare your breasts in the mirror. Look for difference between them, such as: Differences in shape. Differences in size. Wrinkles, dips, and bumps in one breast and not the other. Look at each breast for skin changes, such as: Redness. Scaly spots. Spots where your skin is  thicker. Dimpling. Open sores. Look for changes in your nipples, such as: Fluid coming out of a nipple. Fluid around a nipple. Bleeding. Dimpling. Redness. A nipple that looks pushed in or that has changed position. Feel for changes Lie on your back. Feel each breast. To do this: Pick a breast to feel. Place a pillow under the shoulder closest to that breast. Put the arm closest to that breast behind your head. Feel the breast using the hand of your other arm. Use the pads of your three middle fingers to make small circles starting near the nipple. Use light, medium, and firm pressure. Keep making circles, moving down over the breast. Stop when you feel your ribs. Start making circles with your fingers again, this time going up until you reach your collarbone. Then, make circles out across your breast and into your armpit area. Squeeze your nipple. Check for fluid and lumps. Do these steps again to check your other breast. Sit or stand in the tub or shower. With soapy water on your skin, feel each breast the same way you did when you were lying down. Write down what you find Writing down what you find can help you keep track of what you want to tell your doctor. Write down: What's normal for each breast. Any changes you find. Write down: The kind of change. If your breast feels tender or painful.  Any lump you find. Write down its size and where it is. When you last had your period. General tips If you're breastfeeding, the best time to check your breasts is after you feed your baby or after you use a breast pump. If you get a period, the best time to check your breasts is 5-7 days after your period ends. With time, you'll get more used to doing the self-exam. You'll also start to know if there are changes in your breasts. Contact a doctor if: You see a change in the shape or size of your breasts or nipples. You see a change in the skin of your breast or nipples. You have fluid  coming from your nipples that isn't normal. You find a new lump or thick area. You have breast pain. You have any concerns about your breast health. This information is not intended to replace advice given to you by your health care provider. Make sure you discuss any questions you have with your health care provider. Document Revised: 07/17/2023 Document Reviewed: 07/17/2023 Elsevier Patient Education  2025 [employer].

## 2024-05-25 ENCOUNTER — Other Ambulatory Visit (HOSPITAL_COMMUNITY)
Admission: RE | Admit: 2024-05-25 | Discharge: 2024-05-25 | Disposition: A | Discharge status: Home / Self Care | Source: Ambulatory Visit | Attending: Certified Nurse Midwife | Admitting: Certified Nurse Midwife

## 2024-05-25 ENCOUNTER — Encounter: Payer: Self-pay | Admitting: Certified Nurse Midwife

## 2024-05-25 ENCOUNTER — Ambulatory Visit (INDEPENDENT_AMBULATORY_CARE_PROVIDER_SITE_OTHER): Admitting: Certified Nurse Midwife

## 2024-05-25 VITALS — BP 132/79 | HR 65 | Ht 66.0 in | Wt 278.6 lb

## 2024-05-25 DIAGNOSIS — Z01419 Encounter for gynecological examination (general) (routine) without abnormal findings: Secondary | ICD-10-CM | POA: Insufficient documentation

## 2024-05-25 DIAGNOSIS — R7303 Prediabetes: Secondary | ICD-10-CM | POA: Diagnosis not present

## 2024-05-25 DIAGNOSIS — Z124 Encounter for screening for malignant neoplasm of cervix: Secondary | ICD-10-CM | POA: Diagnosis present

## 2024-05-25 DIAGNOSIS — N643 Galactorrhea not associated with childbirth: Secondary | ICD-10-CM | POA: Diagnosis not present

## 2024-05-25 DIAGNOSIS — E559 Vitamin D deficiency, unspecified: Secondary | ICD-10-CM | POA: Diagnosis not present

## 2024-05-25 DIAGNOSIS — Z1151 Encounter for screening for human papillomavirus (HPV): Secondary | ICD-10-CM | POA: Diagnosis present

## 2024-05-25 DIAGNOSIS — Z113 Encounter for screening for infections with a predominantly sexual mode of transmission: Secondary | ICD-10-CM | POA: Diagnosis present

## 2024-05-25 DIAGNOSIS — R5383 Other fatigue: Secondary | ICD-10-CM | POA: Diagnosis not present

## 2024-05-25 DIAGNOSIS — Z114 Encounter for screening for human immunodeficiency virus [HIV]: Secondary | ICD-10-CM

## 2024-05-26 ENCOUNTER — Ambulatory Visit: Payer: Self-pay | Admitting: Certified Nurse Midwife

## 2024-05-26 ENCOUNTER — Telehealth: Payer: Self-pay

## 2024-05-26 LAB — LIPID PANEL
Chol/HDL Ratio: 3.2 ratio (ref 0.0–4.4)
Cholesterol, Total: 159 mg/dL (ref 100–199)
HDL: 50 mg/dL
LDL Chol Calc (NIH): 88 mg/dL (ref 0–99)
Triglycerides: 116 mg/dL (ref 0–149)
VLDL Cholesterol Cal: 21 mg/dL (ref 5–40)

## 2024-05-26 LAB — COMPREHENSIVE METABOLIC PANEL WITH GFR
ALT: 14 IU/L (ref 0–32)
AST: 19 IU/L (ref 0–40)
Albumin: 4.4 g/dL (ref 3.9–4.9)
Alkaline Phosphatase: 84 IU/L (ref 41–116)
BUN/Creatinine Ratio: 10 (ref 9–23)
BUN: 8 mg/dL (ref 6–20)
Bilirubin Total: 0.5 mg/dL (ref 0.0–1.2)
CO2: 24 mmol/L (ref 20–29)
Calcium: 9.3 mg/dL (ref 8.7–10.2)
Chloride: 104 mmol/L (ref 96–106)
Creatinine, Ser: 0.84 mg/dL (ref 0.57–1.00)
Globulin, Total: 2.5 g/dL (ref 1.5–4.5)
Glucose: 98 mg/dL (ref 70–99)
Potassium: 4.4 mmol/L (ref 3.5–5.2)
Sodium: 141 mmol/L (ref 134–144)
Total Protein: 6.9 g/dL (ref 6.0–8.5)
eGFR: 93 mL/min/1.73

## 2024-05-26 LAB — CBC
Hematocrit: 38.7 % (ref 34.0–46.6)
Hemoglobin: 12 g/dL (ref 11.1–15.9)
MCH: 24.4 pg — ABNORMAL LOW (ref 26.6–33.0)
MCHC: 31 g/dL — ABNORMAL LOW (ref 31.5–35.7)
MCV: 79 fL (ref 79–97)
Platelets: 362 x10E3/uL (ref 150–450)
RBC: 4.92 x10E6/uL (ref 3.77–5.28)
RDW: 13.8 % (ref 11.7–15.4)
WBC: 8.8 x10E3/uL (ref 3.4–10.8)

## 2024-05-26 LAB — HIV ANTIBODY (ROUTINE TESTING W REFLEX): HIV Screen 4th Generation wRfx: NONREACTIVE

## 2024-05-26 LAB — HEMOGLOBIN A1C
Est. average glucose Bld gHb Est-mCnc: 126 mg/dL
Hgb A1c MFr Bld: 6 % — ABNORMAL HIGH (ref 4.8–5.6)

## 2024-05-26 LAB — TSH RFX ON ABNORMAL TO FREE T4: TSH: 1.16 u[IU]/mL (ref 0.450–4.500)

## 2024-05-26 LAB — PROLACTIN: Prolactin: 1.1 ng/mL — ABNORMAL LOW (ref 4.8–33.4)

## 2024-05-26 LAB — VITAMIN D 25 HYDROXY (VIT D DEFICIENCY, FRACTURES): Vit D, 25-Hydroxy: 11.7 ng/mL — ABNORMAL LOW (ref 30.0–100.0)

## 2024-05-26 LAB — SYPHILIS: RPR W/REFLEX TO RPR TITER AND TREPONEMAL ANTIBODIES, TRADITIONAL SCREENING AND DIAGNOSIS ALGORITHM: RPR Ser Ql: NONREACTIVE

## 2024-05-26 MED ORDER — VITAMIN D (ERGOCALCIFEROL) 1.25 MG (50000 UNIT) PO CAPS: 50000.0000 [IU] | ORAL_CAPSULE | ORAL | 0 refills | Status: AC

## 2024-05-26 MED ORDER — METFORMIN HCL 500 MG PO TABS: ORAL_TABLET | ORAL | 1 refills | Status: AC

## 2024-05-26 NOTE — Telephone Encounter (Signed)
 TRIAGE VOICEMAIL: Patient states she went to pharmacy to pick up metformin  rx, but pharmacy advised they did not receive the rx.

## 2024-05-27 ENCOUNTER — Other Ambulatory Visit: Payer: Self-pay | Admitting: *Deleted

## 2024-05-27 ENCOUNTER — Inpatient Hospital Stay
Admission: RE | Admit: 2024-05-27 | Discharge: 2024-05-27 | Disposition: A | Discharge status: Home / Self Care | Payer: Self-pay | Source: Ambulatory Visit | Attending: Physician Assistant | Admitting: Physician Assistant

## 2024-05-27 DIAGNOSIS — Z1231 Encounter for screening mammogram for malignant neoplasm of breast: Secondary | ICD-10-CM

## 2024-05-28 LAB — CYTOLOGY - PAP
Chlamydia: NEGATIVE
Comment: NEGATIVE
Comment: NEGATIVE
Comment: NORMAL
Diagnosis: NEGATIVE
High risk HPV: NEGATIVE
Neisseria Gonorrhea: NEGATIVE

## 2024-06-12 ENCOUNTER — Ambulatory Visit
Admission: RE | Admit: 2024-06-12 | Discharge: 2024-06-12 | Disposition: A | Source: Ambulatory Visit | Attending: Certified Nurse Midwife | Admitting: Certified Nurse Midwife

## 2024-06-12 ENCOUNTER — Encounter: Payer: Self-pay | Admitting: Oncology

## 2024-06-12 DIAGNOSIS — Z1231 Encounter for screening mammogram for malignant neoplasm of breast: Secondary | ICD-10-CM | POA: Diagnosis present

## 2024-06-12 DIAGNOSIS — Z01419 Encounter for gynecological examination (general) (routine) without abnormal findings: Secondary | ICD-10-CM

## 2024-06-26 ENCOUNTER — Telehealth: Payer: Self-pay

## 2024-06-26 DIAGNOSIS — L918 Other hypertrophic disorders of the skin: Secondary | ICD-10-CM

## 2024-06-26 NOTE — Telephone Encounter (Signed)
 Patient called and reported that skin tag never fell off after you tied it. She would like a referral to dermatology to have it looked at, to make sure it is a skin tag. It continues to bother her and she has had to place a band-aid over it so it want get caught on her clothing. I put the order in just need you to sign it.
